# Patient Record
Sex: Female | Born: 1965 | Race: White | Hispanic: No | Marital: Single | Smoking: Current every day smoker
Health system: Southern US, Community
[De-identification: ages and names within clinical notes are randomized; demographics above are authoritative.]

## PROBLEM LIST (undated history)

## (undated) DIAGNOSIS — M1990 Unspecified osteoarthritis, unspecified site: Secondary | ICD-10-CM

## (undated) DIAGNOSIS — K219 Gastro-esophageal reflux disease without esophagitis: Secondary | ICD-10-CM

## (undated) DIAGNOSIS — E785 Hyperlipidemia, unspecified: Secondary | ICD-10-CM

## (undated) DIAGNOSIS — F17200 Nicotine dependence, unspecified, uncomplicated: Secondary | ICD-10-CM

## (undated) DIAGNOSIS — R7303 Prediabetes: Secondary | ICD-10-CM

## (undated) DIAGNOSIS — I10 Essential (primary) hypertension: Secondary | ICD-10-CM

## (undated) HISTORY — PX: NO PAST SURGERIES: SHX2092

## (undated) HISTORY — DX: Essential (primary) hypertension: I10

---

## 1998-03-11 ENCOUNTER — Emergency Department (HOSPITAL_COMMUNITY): Admission: EM | Admit: 1998-03-11 | Discharge: 1998-03-11 | Payer: Self-pay | Admitting: Emergency Medicine

## 1998-11-08 ENCOUNTER — Emergency Department (HOSPITAL_COMMUNITY): Admission: EM | Admit: 1998-11-08 | Discharge: 1998-11-08 | Payer: Self-pay | Admitting: Emergency Medicine

## 2000-10-30 ENCOUNTER — Emergency Department (HOSPITAL_COMMUNITY): Admission: EM | Admit: 2000-10-30 | Discharge: 2000-10-30 | Payer: Self-pay | Admitting: Emergency Medicine

## 2003-03-31 ENCOUNTER — Emergency Department (HOSPITAL_COMMUNITY): Admission: AD | Admit: 2003-03-31 | Discharge: 2003-03-31 | Payer: Self-pay | Admitting: Family Medicine

## 2003-06-06 ENCOUNTER — Emergency Department (HOSPITAL_COMMUNITY): Admission: AD | Admit: 2003-06-06 | Discharge: 2003-06-06 | Payer: Self-pay | Admitting: Internal Medicine

## 2003-07-16 ENCOUNTER — Emergency Department (HOSPITAL_COMMUNITY): Admission: EM | Admit: 2003-07-16 | Discharge: 2003-07-16 | Payer: Self-pay | Admitting: Emergency Medicine

## 2005-05-31 ENCOUNTER — Emergency Department (HOSPITAL_COMMUNITY): Admission: EM | Admit: 2005-05-31 | Discharge: 2005-05-31 | Payer: Self-pay | Admitting: Family Medicine

## 2006-10-01 ENCOUNTER — Emergency Department (HOSPITAL_COMMUNITY): Admission: EM | Admit: 2006-10-01 | Discharge: 2006-10-01 | Payer: Self-pay | Admitting: Family Medicine

## 2007-07-30 ENCOUNTER — Emergency Department (HOSPITAL_COMMUNITY): Admission: EM | Admit: 2007-07-30 | Discharge: 2007-07-30 | Payer: Self-pay | Admitting: Family Medicine

## 2008-10-13 ENCOUNTER — Emergency Department (HOSPITAL_COMMUNITY): Admission: EM | Admit: 2008-10-13 | Discharge: 2008-10-13 | Payer: Self-pay | Admitting: Emergency Medicine

## 2009-10-04 ENCOUNTER — Emergency Department (HOSPITAL_COMMUNITY): Admission: EM | Admit: 2009-10-04 | Discharge: 2009-10-04 | Payer: Self-pay | Admitting: Emergency Medicine

## 2010-08-22 LAB — POCT RAPID STREP A (OFFICE): Streptococcus, Group A Screen (Direct): NEGATIVE

## 2011-01-23 ENCOUNTER — Ambulatory Visit (INDEPENDENT_AMBULATORY_CARE_PROVIDER_SITE_OTHER): Payer: Self-pay

## 2011-01-23 ENCOUNTER — Inpatient Hospital Stay (INDEPENDENT_AMBULATORY_CARE_PROVIDER_SITE_OTHER)
Admission: RE | Admit: 2011-01-23 | Discharge: 2011-01-23 | Disposition: A | Discharge status: Home / Self Care | Payer: Self-pay | Source: Ambulatory Visit | Attending: Family Medicine | Admitting: Family Medicine

## 2011-01-23 DIAGNOSIS — S93409A Sprain of unspecified ligament of unspecified ankle, initial encounter: Secondary | ICD-10-CM

## 2011-03-27 ENCOUNTER — Encounter: Payer: Self-pay | Admitting: *Deleted

## 2011-03-27 ENCOUNTER — Emergency Department (INDEPENDENT_AMBULATORY_CARE_PROVIDER_SITE_OTHER)
Admission: EM | Admit: 2011-03-27 | Discharge: 2011-03-27 | Disposition: A | Discharge status: Home / Self Care | Payer: Self-pay | Source: Home / Self Care | Attending: Emergency Medicine | Admitting: Emergency Medicine

## 2011-03-27 DIAGNOSIS — R591 Generalized enlarged lymph nodes: Secondary | ICD-10-CM

## 2011-03-27 DIAGNOSIS — R599 Enlarged lymph nodes, unspecified: Secondary | ICD-10-CM

## 2011-03-27 MED ORDER — IBUPROFEN 800 MG PO TABS: 800.0000 mg | ORAL_TABLET | Freq: Three times a day (TID) | ORAL | Status: AC

## 2011-03-27 NOTE — ED Provider Notes (Signed)
History     CSN: [id number] Arrival date & time: 03/27/2011  1:09 PM   First MD Initiated Contact with Patient 03/27/11 1231      Chief Complaint  Patient presents with  . Headache    (Consider location/radiation/quality/duration/timing/severity/associated sxs/prior treatment) HPI Comments: Pt states she noticed a tender lump behind her Lt ear 2 days ago. No change in size since onset. Denies fever, ear pain, or sore throat. Has mild ongoing nasal congestion - unchanged. No scalp lesions and has not recently used any hair processing products. She tried Tylenol for her discomfort w/o relief. Mild constant discomfort, but mostly is tender to palpation.   The history is provided by the patient.    History reviewed. No pertinent past medical history.  History reviewed. No pertinent past surgical history.  Family History  Problem Relation Age of Onset  . Hypertension Mother   . Diabetes Mother     History  Substance Use Topics  . Smoking status: Current Everyday Smoker -- 0.5 packs/day    Types: Cigarettes  . Smokeless tobacco: Not on file  . Alcohol Use: Yes     socially    OB History    Grav Para Term Preterm Abortions TAB SAB Ect Mult Living                  Review of Systems  Constitutional: Negative for fever, chills and fatigue.  HENT: Negative for ear pain, sore throat, rhinorrhea, sneezing, postnasal drip and sinus pressure.   Respiratory: Negative for cough, shortness of breath and wheezing.   Cardiovascular: Negative for chest pain and palpitations.    Allergies  Review of patient's allergies indicates no known allergies.  Home Medications   Current Outpatient Rx  Name Route Sig Dispense Refill  . IBUPROFEN 800 MG PO TABS Oral Take 1 tablet (800 mg total) by mouth 3 (three) times daily. 15 tablet 0    BP 153/90  Pulse 78  Temp(Src) 98.9 F (37.2 C) (Oral)  Resp 18  SpO2 100%  LMP 03/26/2011  Physical Exam  Nursing note and vitals  reviewed. Constitutional: She appears well-developed and well-nourished. No distress.  HENT:  Head: Normocephalic and atraumatic.  Right Ear: Tympanic membrane, external ear and ear canal normal.  Left Ear: Tympanic membrane, external ear and ear canal normal.  Nose: Nose normal.  Mouth/Throat: Uvula is midline, oropharynx is clear and moist and mucous membranes are normal. No oropharyngeal exudate, posterior oropharyngeal edema or posterior oropharyngeal erythema.  Neck: Neck supple.  Cardiovascular: Normal rate, regular rhythm and normal heart sounds.   Pulmonary/Chest: Effort normal and breath sounds normal. No respiratory distress.  Lymphadenopathy:       Head (right side): No submental, no submandibular, no tonsillar, no preauricular, no posterior auricular and no occipital adenopathy present.       Head (left side): Occipital (one tender smooth node < 1cm ) adenopathy present. No submental, no submandibular, no tonsillar, no preauricular and no posterior auricular adenopathy present.    She has no cervical adenopathy.    She has no axillary adenopathy.       Right: No supraclavicular adenopathy present.       Left: No supraclavicular adenopathy present.  Neurological: She is alert.  Skin: Skin is warm and dry. No rash noted.  Psychiatric: She has a normal mood and affect.    ED Course  Procedures (including critical care time)  Labs Reviewed - No data to display No results found.  1. Lymphadenopathy       MDM  One shotty Lt occipital node w/ no other lymphadenopathy.        Melody Comas, [place] 03/27/11 1451

## 2011-03-27 NOTE — ED Provider Notes (Signed)
Medical screening examination/treatment/procedure(s) were performed by non-physician practitioner and as supervising physician I was immediately available for consultation/collaboration.  Kiah Keay G  D.O.    Marthena Whitmyer G Meda Dudzinski, MD 03/27/11 1605 

## 2011-03-27 NOTE — ED Notes (Signed)
Pt c/o pain behind left ear with swelling onset Saturday.  Denies injury, earache, sinus congestion.  States it's sore to touch.  Denies fever.

## 2012-07-01 ENCOUNTER — Encounter (HOSPITAL_COMMUNITY): Payer: Self-pay | Admitting: *Deleted

## 2012-07-01 ENCOUNTER — Emergency Department (HOSPITAL_COMMUNITY)
Admission: EM | Admit: 2012-07-01 | Discharge: 2012-07-01 | Disposition: A | Discharge status: Home / Self Care | Payer: Self-pay | Attending: Emergency Medicine | Admitting: Emergency Medicine

## 2012-07-01 ENCOUNTER — Emergency Department (HOSPITAL_COMMUNITY): Payer: Self-pay

## 2012-07-01 DIAGNOSIS — M778 Other enthesopathies, not elsewhere classified: Secondary | ICD-10-CM

## 2012-07-01 DIAGNOSIS — F17200 Nicotine dependence, unspecified, uncomplicated: Secondary | ICD-10-CM | POA: Insufficient documentation

## 2012-07-01 DIAGNOSIS — M6580 Other synovitis and tenosynovitis, unspecified site: Secondary | ICD-10-CM | POA: Insufficient documentation

## 2012-07-01 MED ORDER — PROMETHAZINE HCL 12.5 MG PO TABS: 12.5000 mg | ORAL_TABLET | Freq: Four times a day (QID) | ORAL | Status: DC | PRN

## 2012-07-01 MED ORDER — NAPROXEN 500 MG PO TABS: 500.0000 mg | ORAL_TABLET | Freq: Two times a day (BID) | ORAL | Status: DC

## 2012-07-01 MED ORDER — TRAMADOL HCL 50 MG PO TABS: 50.0000 mg | ORAL_TABLET | Freq: Four times a day (QID) | ORAL | Status: DC | PRN

## 2012-07-01 NOTE — ED Provider Notes (Signed)
History     CSN: [id number]  Arrival date & time 07/01/12  7829   First MD Initiated Contact with Patient 07/01/12 1119      Chief Complaint  Patient presents with  . Elbow Pain    HPI Kristi Cisneros is a 46 y.o. female who presents to the ED with elbow pain. The pain started 4 or 5 months ago. No known injury. The history was provided by the patient.  History reviewed. No pertinent past medical history.  History reviewed. No pertinent past surgical history.  Family History  Problem Relation Age of Onset  . Hypertension Mother   . Diabetes Mother     History  Substance Use Topics  . Smoking status: Current Every Day Smoker -- 0.50 packs/day    Types: Cigarettes  . Smokeless tobacco: Not on file  . Alcohol Use: Yes     Comment: socially    OB History   Grav Para Term Preterm Abortions TAB SAB Ect Mult Living                  Review of Systems  Constitutional: Negative for fever and appetite change.  HENT: Negative.   Eyes: Negative for visual disturbance.  Respiratory: Negative for cough.   Musculoskeletal: Positive for joint swelling.       Pain in left elbow  Allergic/Immunologic: Negative for food allergies and immunocompromised state.  Psychiatric/Behavioral: Negative for confusion. The patient is not nervous/anxious.     Allergies  Review of patient's allergies indicates no known allergies.  Home Medications  No current outpatient prescriptions on file.  BP 149/112  Pulse 108  Temp(Src) 98 F (36.7 C) (Oral)  Resp 16  Ht 5\' 7"  (1.702 m)  Wt 180 lb (81.647 kg)  BMI 28.19 kg/m2  SpO2 100%  LMP 06/29/2012  Physical Exam  Nursing note and vitals reviewed. Constitutional: She is oriented to person, place, and time. She appears well-developed and well-nourished. No distress.  HENT:  Head: Normocephalic and atraumatic.  Eyes: EOM are normal.  Neck: Neck supple.  Cardiovascular: Normal rate.   Pulmonary/Chest: Effort normal.  Musculoskeletal:        Left elbow: She exhibits normal range of motion, no deformity and no laceration. Swelling: minimal. Tenderness found. Radial head tenderness noted.  Radial pulse strong and equal bilateral. Good grips, adequate circulation.  Neurological: She is alert and oriented to person, place, and time. No cranial nerve deficit.  Skin: Skin is warm and dry.  Psychiatric: She has a normal mood and affect. Her behavior is normal. Judgment and thought content normal.   Procedures  Labs Reviewed - No data to display Dg Elbow Complete Left  07/01/2012  *RADIOLOGY REPORT*  Clinical Data: Left elbow pain.  LEFT ELBOW - COMPLETE 3+ VIEW  Comparison: 06/06/2003.  Findings: Four views of the left elbow demonstrate no acute displaced fracture, subluxation, dislocation, joint or soft tissue abnormality.  IMPRESSION: 1.  No acute radiographic abnormality of the left elbow.   Original Report Authenticated By: Trudie Reed, M.D.    Assessment: 47 y.o. female with tendonitis left elbow  Plan:  Pain management   Follow up with ortho, return as needed Discussed with the patient and all questioned fully answered. She will return if any problems arise.    Medication List    TAKE these medications       naproxen 500 MG tablet  Commonly known as:  NAPROSYN  Take 1 tablet (500 mg total) by mouth 2 (  two) times daily.     promethazine 12.5 MG tablet  Commonly known as:  PHENERGAN  Take 1 tablet (12.5 mg total) by mouth every 6 (six) hours as needed for nausea.     traMADol 50 MG tablet  Commonly known as:  ULTRAM  Take 1 tablet (50 mg total) by mouth every 6 (six) hours as needed for pain.             Janne Napoleon, [place] 07/01/12 1152

## 2012-07-01 NOTE — ED Notes (Signed)
Left elbow pain with swelling x 4-5 months.  Denies injury.

## 2012-07-01 NOTE — ED Provider Notes (Signed)
Medical screening examination/treatment/procedure(s) were performed by non-physician practitioner and as supervising physician I was immediately available for consultation/collaboration.   Benny Lennert, MD 07/01/12 1534

## 2012-08-14 ENCOUNTER — Emergency Department (INDEPENDENT_AMBULATORY_CARE_PROVIDER_SITE_OTHER)
Admission: EM | Admit: 2012-08-14 | Discharge: 2012-08-14 | Disposition: A | Discharge status: Home / Self Care | Payer: Self-pay | Source: Home / Self Care | Attending: Emergency Medicine | Admitting: Emergency Medicine

## 2012-08-14 ENCOUNTER — Encounter (HOSPITAL_COMMUNITY): Payer: Self-pay | Admitting: *Deleted

## 2012-08-14 ENCOUNTER — Emergency Department (INDEPENDENT_AMBULATORY_CARE_PROVIDER_SITE_OTHER): Payer: Self-pay

## 2012-08-14 DIAGNOSIS — S93609A Unspecified sprain of unspecified foot, initial encounter: Secondary | ICD-10-CM

## 2012-08-14 DIAGNOSIS — S93409A Sprain of unspecified ligament of unspecified ankle, initial encounter: Secondary | ICD-10-CM

## 2012-08-14 DIAGNOSIS — S93401A Sprain of unspecified ligament of right ankle, initial encounter: Secondary | ICD-10-CM

## 2012-08-14 DIAGNOSIS — S93601A Unspecified sprain of right foot, initial encounter: Secondary | ICD-10-CM

## 2012-08-14 MED ORDER — HYDROCODONE-IBUPROFEN 7.5-200 MG PO TABS: 1.0000 | ORAL_TABLET | Freq: Three times a day (TID) | ORAL | Status: DC | PRN

## 2012-08-14 NOTE — ED Provider Notes (Addendum)
History     CSN: [id number]  Arrival date & time 08/14/12  1127   First MD Initiated Contact with Patient 08/14/12 1158      Chief Complaint  Patient presents with  . Ankle Pain    (Consider location/radiation/quality/duration/timing/severity/associated sxs/prior treatment) HPI Comments: Pt  States  She  Injured  Her  ra  nkle  yest  She  Reports  She  Stepped in a  Hole   And  Twisted  The  Ankle       Patient is a 47 y.o. female presenting with ankle pain. The history is provided by the patient.  Ankle Pain Location:  Ankle Ankle location:  R ankle Pain details:    Quality:  Aching   Radiates to:  Does not radiate   Severity:  Moderate   Onset quality:  Sudden   Timing:  Constant Chronicity:  New Dislocation: no   Foreign body present:  No foreign bodies Tetanus status:  Out of date Prior injury to area:  No Worsened by:  Bearing weight and activity Associated symptoms: swelling   Associated symptoms: no back pain, no decreased ROM, no fever, no itching, no muscle weakness, no neck pain, no stiffness and no tingling   Risk factors: no frequent fractures and no recent illness     History reviewed. No pertinent past medical history.  History reviewed. No pertinent past surgical history.  Family History  Problem Relation Age of Onset  . Hypertension Mother   . Diabetes Mother     History  Substance Use Topics  . Smoking status: Current Every Day Smoker -- 0.50 packs/day    Types: Cigarettes  . Smokeless tobacco: Not on file  . Alcohol Use: Yes     Comment: socially    OB History   Grav Para Term Preterm Abortions TAB SAB Ect Mult Living                  Review of Systems  Constitutional: Positive for activity change. Negative for fever, chills, diaphoresis and appetite change.  HENT: Negative for neck pain.   Musculoskeletal: Negative for myalgias, back pain, joint swelling, arthralgias and stiffness.  Skin: Negative for color change, itching, pallor,  rash and wound.  Neurological: Negative for weakness and numbness.    Allergies  Review of patient's allergies indicates no known allergies.  Home Medications   Current Outpatient Rx  Name  Route  Sig  Dispense  Refill  . HYDROcodone-ibuprofen (VICOPROFEN) 7.5-200 MG per tablet   Oral   Take 1 tablet by mouth every 8 (eight) hours as needed for pain.   15 tablet   0   . promethazine (PHENERGAN) 12.5 MG tablet   Oral   Take 1 tablet (12.5 mg total) by mouth every 6 (six) hours as needed for nausea.   15 tablet   0     BP 161/103  Pulse 106  Temp(Src) 98.2 F (36.8 C) (Oral)  Resp 16  SpO2 100%  LMP 08/02/2012  Physical Exam  Vitals reviewed. Constitutional: She appears well-developed and well-nourished.  Musculoskeletal: She exhibits tenderness.       Feet:  Neurological: She is alert.  Skin: No rash noted. No erythema.    ED Course  Procedures (including critical care time)  Labs Reviewed - No data to display Dg Ankle Complete Right  08/14/2012  *RADIOLOGY REPORT*  Clinical Data: Ankle pain post fall yesterday  RIGHT ANKLE - COMPLETE 3+ VIEW  Comparison: 01/23/2011  Findings: Three views of the right ankle submitted.  No acute fracture or subluxation.  Plantar spur of the calcaneus is noted. Ankle mortise is preserved.  Soft tissue swelling noted adjacent to lateral malleolus.  IMPRESSION: No acute fracture or subluxation.  Lateral soft tissue swelling. Plantar spur of the calcaneus.   Original Report Authenticated By: Natasha Mead, M.D.      1. Ankle sprain, right, initial encounter   2. Foot sprain, right, initial encounter       MDM  Right ankle inversion injury. Patient has been put on a ankle air splint. Use crutches for 72 hours. Vicoprophen Rx        Jimmie Molly, MD 08/14/12 1606  Jimmie Molly, MD 08/14/12 [phone]

## 2012-08-14 NOTE — ED Notes (Signed)
Pt  States  She  Injured  Her  ra  nkle  yest  She  Reports  She  Stepped in a  Hole   And  Twisted  The  Ankle       She  Reports  Pain on  Weight bearing      denys  Any other  injury

## 2013-12-26 ENCOUNTER — Encounter (HOSPITAL_COMMUNITY): Payer: Self-pay | Admitting: Emergency Medicine

## 2013-12-26 ENCOUNTER — Emergency Department (INDEPENDENT_AMBULATORY_CARE_PROVIDER_SITE_OTHER)
Admission: EM | Admit: 2013-12-26 | Discharge: 2013-12-26 | Disposition: A | Discharge status: Home / Self Care | Payer: Self-pay | Source: Home / Self Care | Attending: Emergency Medicine | Admitting: Emergency Medicine

## 2013-12-26 DIAGNOSIS — R030 Elevated blood-pressure reading, without diagnosis of hypertension: Secondary | ICD-10-CM

## 2013-12-26 DIAGNOSIS — IMO0001 Reserved for inherently not codable concepts without codable children: Secondary | ICD-10-CM

## 2013-12-26 LAB — POCT I-STAT, CHEM 8
BUN: 15 mg/dL (ref 6–23)
CALCIUM ION: 1.24 mmol/L — AB (ref 1.12–1.23)
Chloride: 102 mEq/L (ref 96–112)
Creatinine, Ser: 0.9 mg/dL (ref 0.50–1.10)
GLUCOSE: 100 mg/dL — AB (ref 70–99)
HEMATOCRIT: 46 % (ref 36.0–46.0)
HEMOGLOBIN: 15.6 g/dL — AB (ref 12.0–15.0)
POTASSIUM: 4 meq/L (ref 3.7–5.3)
Sodium: 139 mEq/L (ref 137–147)
TCO2: 24 mmol/L (ref 0–100)

## 2013-12-26 MED ORDER — HYDROCHLOROTHIAZIDE 25 MG PO TABS: 25.0000 mg | ORAL_TABLET | Freq: Every day | ORAL | Status: DC

## 2013-12-26 NOTE — ED Provider Notes (Signed)
CSN: [id number]     Arrival date & time 12/26/13  [id number] History   First MD Initiated Contact with Patient 12/26/13 [phone]     Chief Complaint  Patient presents with  . Hypertension   (Consider location/radiation/quality/duration/timing/severity/associated sxs/prior Treatment) HPI She is a 48 year old woman here for evaluation of high blood pressure. She states last week she was at [employer], and had a slight headache. She randomly decided to check her blood pressure and it was elevated at 207/100 something. Yesterday, her mother checked her blood pressure with her home machine several times and it remained 180s-190s/110s. She reports intermittent headaches that respond well to Tylenol and Motrin. She denies any changes in her vision, beyond needing some reading glasses. She does state that she will get a fluttering sensation in her chest. It is located in the right superior chest and back. It typically occurs when she is rushing around in the morning and occasionally when she is going up and down stairs at work. She also reports intermittently feeling like she needs to take a deep breath, but denies any shortness of breath. Denies any leg swelling. She does have chronic foot pain from what sounds like plantar fasciitis, but denies any claudication symptoms.  Hypertension does run in her family. She is a current smoker, half a pack to one pack a day.  History reviewed. No pertinent past medical history. History reviewed. No pertinent past surgical history. Family History  Problem Relation Age of Onset  . Hypertension Mother   . Diabetes Mother    History  Substance Use Topics  . Smoking status: Current Every Day Smoker -- 0.50 packs/day    Types: Cigarettes  . Smokeless tobacco: Not on file  . Alcohol Use: Yes     Comment: socially   OB History   Grav Para Term Preterm Abortions TAB SAB Ect Mult Living                 Review of Systems  Constitutional: Negative.   Eyes: Negative  for visual disturbance.  Respiratory: Negative for cough and shortness of breath.   Cardiovascular: Positive for chest pain. Negative for palpitations and leg swelling.  Gastrointestinal: Negative.   Neurological: Positive for headaches.    Allergies  Review of patient's allergies indicates no known allergies.  Home Medications   Prior to Admission medications   Medication Sig Start Date End Date Taking? Authorizing Provider  hydrochlorothiazide (HYDRODIURIL) 25 MG tablet Take 1 tablet (25 mg total) by mouth daily. 12/26/13   Charm RingsErin J Honig, MD  HYDROcodone-ibuprofen (VICOPROFEN) 7.5-200 MG per tablet Take 1 tablet by mouth every 8 (eight) hours as needed for pain. 08/14/12   Jimmie MollyPaolo Coll, MD  promethazine (PHENERGAN) 12.5 MG tablet Take 1 tablet (12.5 mg total) by mouth every 6 (six) hours as needed for nausea. 07/01/12   Hope Orlene OchM Neese, NP   BP 183/130  Pulse 97  Temp(Src) 98.5 F (36.9 C) (Oral)  Resp 18  SpO2 100%  LMP 12/15/2013 Physical Exam  Constitutional: She is oriented to person, place, and time. She appears well-developed and well-nourished. She appears distressed (mildly anxious).  HENT:  Head: Normocephalic and atraumatic.  Eyes: Conjunctivae are normal. Right eye exhibits no discharge. Left eye exhibits no discharge.  Neck: Normal range of motion. Neck supple.  Cardiovascular: Normal rate, regular rhythm and normal heart sounds.  Exam reveals no gallop.   No murmur heard. Faint bilateral DP pulses, weaker on the left.  Cap refill of 2-3  seconds on the left.  Pulmonary/Chest: Effort normal and breath sounds normal. No respiratory distress. She has no wheezes. She has no rales.  Musculoskeletal: She exhibits no edema.  Lymphadenopathy:    She has no cervical adenopathy.  Neurological: She is alert and oriented to person, place, and time.  Skin: Skin is warm and dry. No rash noted.    ED Course  EKG  Date/Time: 12/26/2013 9:13 AM Performed by: Charm Rings Authorized  by: Charm Rings Interpreted by ED physician Previous ECG: no previous ECG available Rhythm: sinus rhythm Rate: normal BPM: 76 QRS axis: normal ST Segments: ST segments normal T depression: III Clinical impression: non-specific ECG   (including critical care time) Labs Review Labs Reviewed  POCT I-STAT, CHEM 8 - Abnormal; Notable for the following:    Glucose, Bld 100 (*)    Calcium, Ion 1.24 (*)    Hemoglobin 15.6 (*)    All other components within normal limits    Imaging Review No results found.   MDM   1. Elevated BP    With report of chest discomfort that has an exertional component, although it is otherwise atypical, will check an EKG. Will also check an i-STAT for kidney function and electrolytes as I suspect she will need a medication. I am also concerned about peripheral vascular disease given faint pulses. She does not currently have symptoms of claudication. Our financial counselor spoke with her during this visit.  Hypertension may be secondary to stress given multiple life changes in the last few months. However, on review of her records her blood pressure has been elevated on multiple occasions. EKG an i-STAT reviewed. Will start HCTZ 25 mg daily. She will work on finding a primary care physician. If she is unable to find one in the next 2-3 weeks, she will followup here for a repeat i-STAT and blood pressure check. Reviewed warning signs with the patient.    Charm Rings, MD 12/26/13 1011

## 2013-12-26 NOTE — Discharge Instructions (Signed)
Your blood pressure is elevated.  This may be due to the current stress you have. I started a medicine call HCTZ.  Take 1 pill daily.  It may make you pee more the first week or so. Monitor your blood pressure at home; I would like for it to gradually come down to <160/100. If you are getting dizzy, especially when you first stand up, check and blood pressure and make sure we aren't dropping it too low.  Work on getting a primary care doctor. If you are unable to establish with anyone in the next 2-3 weeks, please follow up here so we can recheck your electrolytes and your blood pressure.

## 2013-12-26 NOTE — ED Notes (Signed)
C/o HTN States she had a headache one day and while at the store she checked her bp at the machine States pressure was high so a couple of days later she had her mom check it with her machine  Does have family hx of bp No pcp No meds taking

## 2014-03-04 ENCOUNTER — Ambulatory Visit: Payer: Self-pay | Attending: Family Medicine | Admitting: Family Medicine

## 2014-03-04 ENCOUNTER — Encounter: Payer: Self-pay | Admitting: Family Medicine

## 2014-03-04 VITALS — BP 158/99 | HR 94 | Temp 98.6°F | Resp 16 | Ht 67.0 in | Wt 234.0 lb

## 2014-03-04 DIAGNOSIS — Z8249 Family history of ischemic heart disease and other diseases of the circulatory system: Secondary | ICD-10-CM | POA: Insufficient documentation

## 2014-03-04 DIAGNOSIS — F17200 Nicotine dependence, unspecified, uncomplicated: Secondary | ICD-10-CM

## 2014-03-04 DIAGNOSIS — I10 Essential (primary) hypertension: Secondary | ICD-10-CM | POA: Insufficient documentation

## 2014-03-04 DIAGNOSIS — Z720 Tobacco use: Secondary | ICD-10-CM | POA: Insufficient documentation

## 2014-03-04 DIAGNOSIS — E785 Hyperlipidemia, unspecified: Secondary | ICD-10-CM | POA: Insufficient documentation

## 2014-03-04 MED ORDER — HYDROCHLOROTHIAZIDE 25 MG PO TABS: 25.0000 mg | ORAL_TABLET | Freq: Every day | ORAL | Status: DC

## 2014-03-04 NOTE — Assessment & Plan Note (Signed)
A: HTN currently untreated P: Restart HCTZ Check TSH and lipids

## 2014-03-04 NOTE — Progress Notes (Signed)
Establish Care Hx HTN, no taking medication x1wk

## 2014-03-04 NOTE — Assessment & Plan Note (Signed)
A: x 28 years. Desires to quit P: Cessation resources  Patient to read up on chantix and wellbutrin

## 2014-03-04 NOTE — Progress Notes (Signed)
   Subjective:    Patient ID: Kristi Cisneros, female    DOB: 1965-07-23, 48 y.o.   MRN: [id number] CC: establish care, HTN  HPI   1. HTN: dx in 2015. Patient's long term partner also with HTN. She does not eat a low salt diet. She is active at work but does not exercise. She denies vision changes, CP, SOB, syncope. She was on HCTZ from the urgent care and tolerated this well.   2. Smoker: since age 48. 1 PPD. Working to quit now. Down to 1/5 PPD. Denies chronic cough.   Med Hx: negative for diabetes  Soc hx: current smoker   Fam hx: mother with HTN   Review of Systems As per HPI     Objective:   Physical Exam BP 158/99  Pulse 94  Temp(Src) 98.6 F (37 C) (Oral)  Resp 16  Ht 5\' 7"  (1.702 m)  Wt 234 lb (106.142 kg)  BMI 36.64 kg/m2  SpO2 99%  LMP 02/12/2014 BP Readings from Last 3 Encounters:  03/04/14 158/99  12/26/13 183/130  08/14/12 161/103  General appearance: alert, cooperative and no distress Neck: no adenopathy and thyroid not enlarged, symmetric, no tenderness/mass/nodules Lungs: clear to auscultation bilaterally Heart: regular rate and rhythm, S1, S2 normal, no murmur, click, rub or gallop Extremities: extremities normal, atraumatic, no cyanosis or edema    Assessment & Plan:

## 2014-03-04 NOTE — Patient Instructions (Addendum)
Kristi Cisneros,  Thank you for coming in today. It was a pleasure meeting you. I look forward to being your primary doctor.  1. HTN:  Restart HCTZ 25 mg daily Low salt diet See patient goals Start with 3 meals and 2 snacks daily. Be sure to eat vegetables with every lunch and dinner.   You will be called with lab results   F/u in 2-3 weeks for RN BP check  F/u in 4-6 weeks for physical with pap  Dr. Armen PickupFunches   Low-Sodium Eating Plan Sodium raises blood pressure and causes water to be held in the body. Getting less sodium from food will help lower your blood pressure, reduce any swelling, and protect your heart, liver, and kidneys. We get sodium by adding salt (sodium chloride) to food. Most of our sodium comes from canned, boxed, and frozen foods. Restaurant foods, fast foods, and pizza are also very high in sodium. Even if you take medicine to lower your blood pressure or to reduce fluid in your body, getting less sodium from your food is important. WHAT IS MY PLAN? Most people should limit their sodium intake to 2,300 mg a day. Your health care provider recommends that you limit your sodium intake to __________ a day.  WHAT DO I NEED TO KNOW ABOUT THIS EATING PLAN? For the low-sodium eating plan, you will follow these general guidelines:  Choose foods with a % Daily Value for sodium of less than 5% (as listed on the food label).   Use salt-free seasonings or herbs instead of table salt or sea salt.   Check with your health care provider or pharmacist before using salt substitutes.   Eat fresh foods.  Eat more vegetables and fruits.  Limit canned vegetables. If you do use them, rinse them well to decrease the sodium.   Limit cheese to 1 oz (28 g) per day.   Eat lower-sodium products, often labeled as "lower sodium" or "no salt added."  Avoid foods that contain monosodium glutamate (MSG). MSG is sometimes added to [place] food and some canned foods.  Check food labels  (Nutrition Facts labels) on foods to learn how much sodium is in one serving.  Eat more home-cooked food and less restaurant, buffet, and fast food.  When eating at a restaurant, ask that your food be prepared with less salt or none, if possible.  HOW DO I READ FOOD LABELS FOR SODIUM INFORMATION? The Nutrition Facts label lists the amount of sodium in one serving of the food. If you eat more than one serving, you must multiply the listed amount of sodium by the number of servings. Food labels may also identify foods as:  Sodium free--Less than 5 mg in a serving.  Very low sodium--35 mg or less in a serving.  Low sodium--140 mg or less in a serving.  Light in sodium--50% less sodium in a serving. For example, if a food that usually has 300 mg of sodium is changed to become light in sodium, it will have 150 mg of sodium.  Reduced sodium--25% less sodium in a serving. For example, if a food that usually has 400 mg of sodium is changed to reduced sodium, it will have 300 mg of sodium. WHAT FOODS CAN I EAT? Grains Low-sodium cereals, including oats, puffed wheat and rice, and shredded wheat cereals. Low-sodium crackers. Unsalted rice and pasta. Lower-sodium bread.  Vegetables Frozen or fresh vegetables. Low-sodium or reduced-sodium canned vegetables. Low-sodium or reduced-sodium tomato sauce and paste. Low-sodium or reduced-sodium  tomato and vegetable juices.  Fruits Fresh, frozen, and canned fruit. Fruit juice.  Meat and Other Protein Products Low-sodium canned tuna and salmon. Fresh or frozen meat, poultry, seafood, and fish. Lamb. Unsalted nuts. Dried beans, peas, and lentils without added salt. Unsalted canned beans. Homemade soups without salt. Eggs.  Dairy Milk. Soy milk. Ricotta cheese. Low-sodium or reduced-sodium cheeses. Yogurt.  Condiments Fresh and dried herbs and spices. Salt-free seasonings. Onion and garlic powders. Low-sodium varieties of mustard and ketchup. Lemon  juice.  Fats and Oils Reduced-sodium salad dressings. Unsalted butter.  Other Unsalted popcorn and pretzels.  The items listed above may not be a complete list of recommended foods or beverages. Contact your dietitian for more options. WHAT FOODS ARE NOT RECOMMENDED? Grains Instant hot cereals. Bread stuffing, pancake, and biscuit mixes. Croutons. Seasoned rice or pasta mixes. Noodle soup cups. Boxed or frozen macaroni and cheese. Self-rising flour. Regular salted crackers. Vegetables Regular canned vegetables. Regular canned tomato sauce and paste. Regular tomato and vegetable juices. Frozen vegetables in sauces. Salted french fries. Olives. Rosita FirePickles. Relishes. Sauerkraut. Salsa. Meat and Other Protein Products Salted, canned, smoked, spiced, or pickled meats, seafood, or fish. Bacon, ham, sausage, hot dogs, corned beef, chipped beef, and packaged luncheon meats. Salt pork. Jerky. Pickled herring. Anchovies, regular canned tuna, and sardines. Salted nuts. Dairy Processed cheese and cheese spreads. Cheese curds. Blue cheese and cottage cheese. Buttermilk.  Condiments Onion and garlic salt, seasoned salt, table salt, and sea salt. Canned and packaged gravies. Worcestershire sauce. Tartar sauce. Barbecue sauce. Teriyaki sauce. Soy sauce, including reduced sodium. Steak sauce. Fish sauce. Oyster sauce. Cocktail sauce. Horseradish. Regular ketchup and mustard. Meat flavorings and tenderizers. Bouillon cubes. Hot sauce. Tabasco sauce. Marinades. Taco seasonings. Relishes. Fats and Oils Regular salad dressings. Salted butter. Margarine. Ghee. Bacon fat.  Other Potato and tortilla chips. Corn chips and puffs. Salted popcorn and pretzels. Canned or dried soups. Pizza. Frozen entrees and pot pies.  The items listed above may not be a complete list of foods and beverages to avoid. Contact your dietitian for more information. Document Released: 10/21/2001 Document Revised: 05/06/2013 Document  Reviewed: 03/05/2013 [facility] Patient Information 2015 [employer], [place]. This information is not intended to replace advice given to you by your health care provider. Make sure you discuss any questions you have with your health care provider.

## 2014-03-05 LAB — TSH: TSH: 1.831 u[IU]/mL (ref 0.350–4.500)

## 2014-03-05 LAB — LIPID PANEL
Cholesterol: 254 mg/dL — ABNORMAL HIGH (ref 0–200)
HDL: 45 mg/dL (ref >39)
LDL Cholesterol: 177 mg/dL — ABNORMAL HIGH (ref 0–99)
Total CHOL/HDL Ratio: 5.6 Ratio
Triglycerides: 161 mg/dL — ABNORMAL HIGH (ref <150)
VLDL: 32 mg/dL (ref 0–40)

## 2014-03-06 DIAGNOSIS — E785 Hyperlipidemia, unspecified: Secondary | ICD-10-CM | POA: Insufficient documentation

## 2014-03-06 MED ORDER — ATORVASTATIN CALCIUM 40 MG PO TABS: 40.0000 mg | ORAL_TABLET | Freq: Every day | ORAL | Status: DC

## 2014-03-06 NOTE — Assessment & Plan Note (Signed)
High cholesterol with elevated risk of heart disease, please start lipitor 40 daily.

## 2014-03-06 NOTE — Addendum Note (Signed)
Addended by: Dessa PhiFUNCHES, Gerrick Ray on: 03/06/2014 01:54 PM   Modules accepted: Orders

## 2014-03-09 ENCOUNTER — Telehealth: Payer: Self-pay | Admitting: Emergency Medicine

## 2014-03-09 ENCOUNTER — Telehealth: Payer: Self-pay | Admitting: *Deleted

## 2014-03-09 NOTE — Telephone Encounter (Signed)
Pt given lab results with instructions to start taking prescribed Lipitor 40 mg tab daily Pt instructed to start low fat diet/exercise

## 2014-03-09 NOTE — Telephone Encounter (Signed)
Message copied by Saoirse Legere M on Mon Mar 09, 2014  2:47 PM ------      Message from: FUNCHES, JOSALYN      Created: Fri Mar 06, 2014  1:53 PM       High cholesterol with elevated risk of heart disease, please start lipitor 40 daily.        Normal TSH ------ 

## 2014-03-09 NOTE — Telephone Encounter (Deleted)
Message copied by Dyann KiefGIRALDEZ, Drema Eddington M on Mon Mar 09, 2014  2:47 PM ------      Message from: Dessa PhiFUNCHES, JOSALYN      Created: Fri Mar 06, 2014  1:53 PM       High cholesterol with elevated risk of heart disease, please start lipitor 40 daily.        Normal TSH ------

## 2014-03-09 NOTE — Telephone Encounter (Signed)
Left message with female to return call 

## 2014-03-25 ENCOUNTER — Ambulatory Visit: Payer: Self-pay | Attending: Family Medicine

## 2014-03-25 NOTE — Progress Notes (Unsigned)
   Subjective:    Patient ID: Kristi Cisneros, female    DOB: July 12, 1965, 48 y.o.   MRN: [id number]  HPI    Review of Systems     Objective:   Physical Exam        Assessment & Plan:

## 2014-03-25 NOTE — Patient Instructions (Signed)
Today your blood pressure is in normal range. Continue taking your medications as prescribed.

## 2014-03-25 NOTE — Progress Notes (Unsigned)
Pt is here for a BP check b/c she had elevated BP readings and was put on BP medications.

## 2014-03-27 ENCOUNTER — Ambulatory Visit: Payer: Self-pay | Attending: Family Medicine | Admitting: Family Medicine

## 2014-03-27 ENCOUNTER — Encounter: Payer: Self-pay | Admitting: Family Medicine

## 2014-03-27 VITALS — BP 118/81 | HR 86 | Temp 98.1°F | Resp 16 | Ht 67.0 in | Wt 232.0 lb

## 2014-03-27 DIAGNOSIS — M7661 Achilles tendinitis, right leg: Secondary | ICD-10-CM | POA: Insufficient documentation

## 2014-03-27 DIAGNOSIS — M7660 Achilles tendinitis, unspecified leg: Secondary | ICD-10-CM | POA: Insufficient documentation

## 2014-03-27 DIAGNOSIS — M722 Plantar fascial fibromatosis: Secondary | ICD-10-CM | POA: Insufficient documentation

## 2014-03-27 DIAGNOSIS — F17200 Nicotine dependence, unspecified, uncomplicated: Secondary | ICD-10-CM | POA: Insufficient documentation

## 2014-03-27 MED ORDER — DICLOFENAC SODIUM 1 % TD GEL: 1.0000 "application " | Freq: Four times a day (QID) | TRANSDERMAL | Status: DC

## 2014-03-27 MED ORDER — MELOXICAM 15 MG PO TABS: 15.0000 mg | ORAL_TABLET | Freq: Every day | ORAL | Status: DC

## 2014-03-27 NOTE — Progress Notes (Signed)
   Subjective:    Patient ID: Kristi Cisneros, female    DOB: 1965/12/31, 48 y.o.   MRN: [id number] CC: R ankle pain, b/l foot pain  HPI 48 yo F presents for f/u visit:  1. R ankle pain: x one year. Following twisting injury at home, patient stepped through a board on her deck. Lateral ankle swelling intermittently. Lateral and posterior ankle pain. No recent injury. Pain is worse at end of work day. Patient taking tylenol for pain w/o relief she sopped taking NSAIDs when she learned of high BP. Ice and heat worsen pain.   2. B/l foot pain: dorsal foot pain b/l. Worse when elevated and at end of day. Pain is near heels. Patient does not wear high heels. 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 Heat improves pain.   Soc hx: current smoker  Review of Systems As per HPI     Objective:   Physical Exam BP 118/81 mmHg  Pulse 86  Temp(Src) 98.1 F (36.7 C) (Oral)  Resp 16  Ht 5\' 7"  (1.702 m)  Wt 232 lb (105.235 kg)  BMI 36.33 kg/m2  SpO2 100%  LMP 03/11/2014  Wt Readings from Last 3 Encounters:  03/27/14 232 lb (105.235 kg)  03/04/14 234 lb (106.142 kg)  07/01/12 180 lb (81.647 kg)   General appearance: alert, cooperative and no distress Ankles/feet: R: lateral ankle swelling, no erythema, or skin lacerations. Decreased ROM especially plantar flexion and eversion. Tenderness lateral malleolus, distal Achilles tendon, cuboid, proximal plantar fascia.   L: no ankle swelling. Normal ROM. Normal skin. Tenderness proximal plantar fascia only.        Assessment & Plan:

## 2014-03-27 NOTE — Assessment & Plan Note (Signed)
A:  Left foot pain: plantar fascitis.  Plan: Mobic once daily with food- oral antiinflammatory Topical antiinflammatory- diclofenac gel Elevate Compression with Rt ankle- ACE (or ACE -like bandage) wrapping every morning Exercise as illustrated below, twice daily.  Referral to sports medicine for ultrasound and to discuss other treatment options like nitroglycerin, injections etc.   F/u in 4-6 weeks

## 2014-03-27 NOTE — Progress Notes (Signed)
Complaining of pain on feet, pain is worst on heels Rt ankle swelling and pain due to an accident one year ago

## 2014-03-27 NOTE — Patient Instructions (Addendum)
[name],  Thank you for coming back in today.  1. Rightt ankle pain: I suspect achilles tendon injury and plantar fascitis.  2. Left foot pain: plantar fascitis.  Plan: Mobic once daily with food- oral antiinflammatory Topical antiinflammatory- diclofenac gel Elevate Compression with Rt ankle- ACE (or ACE -like bandage) wrapping every morning Exercise as illustrated below, twice daily.  Referral to sports medicine for ultrasound and to discuss other treatment options like nitroglycerin, injections etc.   F/u in 4-6 weeks  Dr. Armen PickupFunches   Achilles Tendinitis  with Rehab Achilles tendinitis is a disorder of the Achilles tendon. The Achilles tendon connects the large calf muscles (Gastrocnemius and Soleus) to the heel bone (calcaneus). This tendon is sometimes called the heel cord. It is important for pushing-off and standing on your toes and is important for walking, running, or jumping. Tendinitis is often caused by overuse and repetitive microtrauma. SYMPTOMS  Pain, tenderness, swelling, warmth, and redness may occur over the Achilles tendon even at rest.  Pain with pushing off, or flexing or extending the ankle.  Pain that is worsened after or during activity. CAUSES   Overuse sometimes seen with rapid increase in exercise programs or in sports requiring running and jumping.  Poor physical conditioning (strength and flexibility or endurance).  Running sports, especially training running down hills.  Inadequate warm-up before practice or play or failure to stretch before participation.  Injury to the tendon. PREVENTION   Warm up and stretch before practice or competition.  Allow time for adequate rest and recovery between practices and competition.  Keep up conditioning.  Keep up ankle and leg flexibility.  Improve or keep muscle strength and endurance.  Improve cardiovascular fitness.  Use proper technique.  Use proper equipment (shoes, skates).  To help  prevent recurrence, taping, protective strapping, or an adhesive bandage may be recommended for several weeks after healing is complete. PROGNOSIS   Recovery may take weeks to several months to heal.  Longer recovery is expected if symptoms have been prolonged.  Recovery is usually quicker if the inflammation is due to a direct blow as compared with overuse or sudden strain. RELATED COMPLICATIONS   Healing time will be prolonged if the condition is not correctly treated. The injury must be given plenty of time to heal.  Symptoms can reoccur if activity is resumed too soon.  Untreated, tendinitis may increase the risk of tendon rupture requiring additional time for recovery and possibly surgery. TREATMENT   The first treatment consists of rest anti-inflammatory medication, and ice to relieve the pain.  Stretching and strengthening exercises after resolution of pain will likely help reduce the risk of recurrence. Referral to a physical therapist or athletic trainer for further evaluation and treatment may be helpful.  A walking boot or cast may be recommended to rest the Achilles tendon. This can help break the cycle of inflammation and microtrauma.  Arch supports (orthotics) may be prescribed or recommended by your caregiver as an adjunct to therapy and rest.  Surgery to remove the inflamed tendon lining or degenerated tendon tissue is rarely necessary and has shown less than predictable results. MEDICATION   Nonsteroidal anti-inflammatory medications, such as aspirin and ibuprofen, may be used for pain and inflammation relief. Do not take within 7 days before surgery. Take these as directed by your caregiver. Contact your caregiver immediately if any bleeding, stomach upset, or signs of allergic reaction occur. Other minor pain relievers, such as acetaminophen, may also be used.  Pain relievers  may be prescribed as necessary by your caregiver. Do not take prescription pain medication  for longer than 4 to 7 days. Use only as directed and only as much as you need.  Cortisone injections are rarely indicated. Cortisone injections may weaken tendons and predispose to rupture. It is better to give the condition more time to heal than to use them. HEAT AND COLD  Cold is used to relieve pain and reduce inflammation for acute and chronic Achilles tendinitis. Cold should be applied for 10 to 15 minutes every 2 to 3 hours for inflammation and pain and immediately after any activity that aggravates your symptoms. Use ice packs or an ice massage.  Heat may be used before performing stretching and strengthening activities prescribed by your caregiver. Use a heat pack or a warm soak. SEEK MEDICAL CARE IF:  Symptoms get worse or do not improve in 2 weeks despite treatment.  New, unexplained symptoms develop. Drugs used in treatment may produce side effects. EXERCISES RANGE OF MOTION (ROM) AND STRETCHING EXERCISES - Achilles Tendinitis  These exercises may help you when beginning to rehabilitate your injury. Your symptoms may resolve with or without further involvement from your physician, physical therapist or athletic trainer. While completing these exercises, remember:   Restoring tissue flexibility helps normal motion to return to the joints. This allows healthier, less painful movement and activity.  An effective stretch should be held for at least 30 seconds.  A stretch should never be painful. You should only feel a gentle lengthening or release in the stretched tissue. STRETCH - Gastroc, Standing   Place hands on wall.  Extend right / left leg, keeping the front knee somewhat bent.  Slightly point your toes inward on your back foot.  Keeping your right / left heel on the floor and your knee straight, shift your weight toward the wall, not allowing your back to arch.  You should feel a gentle stretch in the right / left calf. Hold this position for __________  seconds. Repeat __________ times. Complete this stretch __________ times per day. STRETCH - Soleus, Standing   Place hands on wall.  Extend right / left leg, keeping the other knee somewhat bent.  Slightly point your toes inward on your back foot.  Keep your right / left heel on the floor, bend your back knee, and slightly shift your weight over the back leg so that you feel a gentle stretch deep in your back calf.  Hold this position for __________ seconds. Repeat __________ times. Complete this stretch __________ times per day. STRETCH - Gastrocsoleus, Standing  Note: This exercise can place a lot of stress on your foot and ankle. Please complete this exercise only if specifically instructed by your caregiver.   Place the ball of your right / left foot on a step, keeping your other foot firmly on the same step.  Hold on to the wall or a rail for balance.  Slowly lift your other foot, allowing your body weight to press your heel down over the edge of the step.  You should feel a stretch in your right / left calf.  Hold this position for __________ seconds.  Repeat this exercise with a slight bend in your knee. Repeat __________ times. Complete this stretch __________ times per day.  STRENGTHENING EXERCISES - Achilles Tendinitis These exercises may help you when beginning to rehabilitate your injury. They may resolve your symptoms with or without further involvement from your physician, physical therapist or athletic trainer. While  completing these exercises, remember:   Muscles can gain both the endurance and the strength needed for everyday activities through controlled exercises.  Complete these exercises as instructed by your physician, physical therapist or athletic trainer. Progress the resistance and repetitions only as guided.  You may experience muscle soreness or fatigue, but the pain or discomfort you are trying to eliminate should never worsen during these exercises.  If this pain does worsen, stop and make certain you are following the directions exactly. If the pain is still present after adjustments, discontinue the exercise until you can discuss the trouble with your clinician. STRENGTH - Plantar-flexors   Sit with your right / left leg extended. Holding onto both ends of a rubber exercise band/tubing, loop it around the ball of your foot. Keep a slight tension in the band.  Slowly push your toes away from you, pointing them downward.  Hold this position for __________ seconds. Return slowly, controlling the tension in the band/tubing. Repeat __________ times. Complete this exercise __________ times per day.  STRENGTH - Plantar-flexors   Stand with your feet shoulder width apart. Steady yourself with a wall or table using as little support as needed.  Keeping your weight evenly spread over the width of your feet, rise up on your toes.*  Hold this position for __________ seconds. Repeat __________ times. Complete this exercise __________ times per day.  *If this is too easy, shift your weight toward your right / left leg until you feel challenged. Ultimately, you may be asked to do this exercise with your right / left foot only. STRENGTH - Plantar-flexors, Eccentric  Note: This exercise can place a lot of stress on your foot and ankle. Please complete this exercise only if specifically instructed by your caregiver.   Place the balls of your feet on a step. With your hands, use only enough support from a wall or rail to keep your balance.  Keep your knees straight and rise up on your toes.  Slowly shift your weight entirely to your right / left toes and pick up your opposite foot. Gently and with controlled movement, lower your weight through your right / left foot so that your heel drops below the level of the step. You will feel a slight stretch in the back of your calf at the end position.  Use the healthy leg to help rise up onto the balls of both  feet, then lower weight only on the right / left leg again. Build up to 15 repetitions. Then progress to 3 consecutive sets of 15 repetitions.*  After completing the above exercise, complete the same exercise with a slight knee bend (about 30 degrees). Again, build up to 15 repetitions. Then progress to 3 consecutive sets of 15 repetitions.* Perform this exercise __________ times per day.  *When you easily complete 3 sets of 15, your physician, physical therapist or athletic trainer may advise you to add resistance by wearing a backpack filled with additional weight. STRENGTH - Plantar Flexors, Seated   Sit on a chair that allows your feet to rest flat on the ground. If necessary, sit at the edge of the chair.  Keeping your toes firmly on the ground, lift your right / left heel as far as you can without increasing any discomfort in your ankle. Repeat __________ times. Complete this exercise __________ times a day. *If instructed by your physician, physical therapist or athletic trainer, you may add ____________________ of resistance by placing a weighted object on your right /  left knee. Document Released: 11/30/2004 Document Revised: 07/24/2011 Document Reviewed: 08/13/2008 [facility] Patient Information 2015 [place], [place]. This information is not intended to replace advice given to you by your health care provider. Make sure you discuss any questions you have with your health care provider. Plantar Fasciitis Plantar fasciitis is a common condition that causes foot pain. It is soreness (inflammation) of the band of tough fibrous tissue on the bottom of the foot that runs from the heel bone (calcaneus) to the ball of the foot. The cause of this soreness may be from excessive standing, poor fitting shoes, running on hard surfaces, being overweight, having an abnormal walk, or overuse (this is common in runners) of the painful foot or feet. It is also common in aerobic exercise dancers and ballet  dancers. SYMPTOMS  Most people with plantar fasciitis complain of:  Severe pain in the morning on the bottom of their foot especially when taking the first steps out of bed. This pain recedes after a few minutes of walking.  Severe pain is experienced also during walking following a long period of inactivity.  Pain is worse when walking barefoot or up stairs DIAGNOSIS   Your caregiver will diagnose this condition by examining and feeling your foot.  Special tests such as X-rays of your foot, are usually not needed. PREVENTION   Consult a sports medicine professional before beginning a new exercise program.  Walking programs offer a good workout. With walking there is a lower chance of overuse injuries common to runners. There is less impact and less jarring of the joints.  Begin all new exercise programs slowly. If problems or pain develop, decrease the amount of time or distance until you are at a comfortable level.  Wear good shoes and replace them regularly.  Stretch your foot and the heel cords at the back of the ankle (Achilles tendon) both before and after exercise.  Run or exercise on even surfaces that are not hard. For example, asphalt is better than pavement.  Do not run barefoot on hard surfaces.  If using a treadmill, vary the incline.  Do not continue to workout if you have foot or joint problems. Seek professional help if they do not improve. HOME CARE INSTRUCTIONS   Avoid activities that cause you pain until you recover.  Use ice or cold packs on the problem or painful areas after working out.  Only take over-the-counter or prescription medicines for pain, discomfort, or fever as directed by your caregiver.  Soft shoe inserts or athletic shoes with air or gel sole cushions may be helpful.  If problems continue or become more severe, consult a sports medicine caregiver or your own health care provider. Cortisone is a potent anti-inflammatory medication that  may be injected into the painful area. You can discuss this treatment with your caregiver. MAKE SURE YOU:   Understand these instructions.  Will watch your condition.  Will get help right away if you are not doing well or get worse. Document Released: 01/24/2001 Document Revised: 07/24/2011 Document Reviewed: 03/25/2008 [facility] Patient Information 2015 [place], [place]. This information is not intended to replace advice given to you by your health care provider. Make sure you discuss any questions you have with your health care provider.

## 2014-03-27 NOTE — Assessment & Plan Note (Signed)
A: Right ankle pain: I suspect achilles tendon injury and plantar fascitis.    Plan: Mobic once daily with food- oral antiinflammatory Topical antiinflammatory- diclofenac gel Elevate Compression with Rt ankle- ACE (or ACE -like bandage) wrapping every morning Exercise as illustrated below, twice daily.  Referral to sports medicine for ultrasound and to discuss other treatment options like nitroglycerin, injections etc.   F/u in 4-6 weeks

## 2014-04-17 ENCOUNTER — Ambulatory Visit: Payer: Self-pay | Admitting: Family Medicine

## 2014-04-17 ENCOUNTER — Encounter: Payer: Self-pay | Admitting: Family Medicine

## 2014-04-17 ENCOUNTER — Ambulatory Visit (INDEPENDENT_AMBULATORY_CARE_PROVIDER_SITE_OTHER): Payer: Self-pay | Admitting: Family Medicine

## 2014-04-17 VITALS — BP 138/91 | Ht 68.0 in | Wt 220.0 lb

## 2014-04-17 DIAGNOSIS — M25571 Pain in right ankle and joints of right foot: Secondary | ICD-10-CM | POA: Insufficient documentation

## 2014-04-17 DIAGNOSIS — M722 Plantar fascial fibromatosis: Secondary | ICD-10-CM

## 2014-04-17 NOTE — Progress Notes (Signed)
Patient ID: Kristi Cisneros, female   DOB: Aug 19, 1965, 48 y.o.   MRN: [id number] [facility] Attending Note: I have seen and examined this patient. I have discussed this patient with the resident and reviewed the assessment and plan as documented above. I agree with the resident's findings and plan. May ultimately benefit from custom molded orthotics. I really want her ankle(s) to increase in strength and try the scaphoid pads before venturing down that path so will see her back for further consideration of that.

## 2014-04-17 NOTE — Progress Notes (Signed)
Kristi BryantCandace D Cisneros 08/05/1965 48 y.o. [id number]  Subjective:   Plantar fascitis: Patient with bilateral plantar foot pain since July. She states it is worse in the morning and after sitting for a long time. It hurts to raise her foot to place on socks. She tried to take motrin for the pain without good relief. She had no injury to the area and has not started a new exercise course or have new shoes.   Right ankle pain: Pt states approximately 1 year ago she suffered from a bad ankle sprain after her foot went through her deck. Initially it became swollen, she had trouble weight bearing and it was "purple." She stated she went to the [facility] and xrays performed showed no fractures. She complains of pain on the lateral aspect of her ankle and part of her achilles. She reports the pain is worse with touch and movement of her ankle. Tylenol sometimes helps with the pain, but if it is bad enough then it does not help. She started Mobic 2 weeks ago, but does not notice a difference. She has tried ice and heat, with increase in pain with use.   Objective:  BP 138/91 mmHg  Ht 5\' 8"  (1.727 m)  Wt 220 lb (99.791 kg)  BMI 33.46 kg/m2  LMP 03/11/2014 Gen: Pleasant, caucasian female. NAD. Non-toxic in appearance. Well developed, well nourished. Obese.  Ext: No erythema. Mild soft tissue swelling lateral aspect of right ankle. TTP inferior and posterior to lateral malleolus. TTP bilateral proximal plantar fascia. Limited ROM in plantar and dorsiflexion (right) in comparison to other extremity. Increased pain with passive inversion(right). Neurovascularly intact distally.

## 2014-04-17 NOTE — Assessment & Plan Note (Signed)
Patient with chronic pain and swelling of the right ankle  - Likely increased weakness and pain from lack of therapy to the ankle after acute ankle sprain  injury 1 year ago.  - Ankle exercises/stretches given today.  - F/u 3-4 weeks.

## 2014-04-17 NOTE — Assessment & Plan Note (Signed)
Bilateral plantar fasciitis - Patient given exercises to complete today, they were discussed and demonstrated with her. - She is to continue the anti-inflammatories already prescribed. - We'll continue to monitor - Scaphoid pads placed in bilateral insoles, patient is to follow-up in 3-4 weeks if improvement with scaphoid pads and exercises will consider making custom orthotics. - Follow-up 3-4 weeks

## 2014-05-22 ENCOUNTER — Ambulatory Visit (INDEPENDENT_AMBULATORY_CARE_PROVIDER_SITE_OTHER): Payer: No Typology Code available for payment source | Admitting: Family Medicine

## 2014-05-22 ENCOUNTER — Encounter: Payer: Self-pay | Admitting: Family Medicine

## 2014-05-22 ENCOUNTER — Ambulatory Visit
Admission: RE | Admit: 2014-05-22 | Discharge: 2014-05-22 | Disposition: A | Discharge status: Home / Self Care | Payer: No Typology Code available for payment source | Source: Ambulatory Visit | Attending: Family Medicine | Admitting: Family Medicine

## 2014-05-22 VITALS — BP 126/87 | HR 97 | Ht 68.0 in | Wt 220.0 lb

## 2014-05-22 DIAGNOSIS — M7661 Achilles tendinitis, right leg: Secondary | ICD-10-CM

## 2014-05-22 DIAGNOSIS — M25561 Pain in right knee: Secondary | ICD-10-CM

## 2014-05-22 DIAGNOSIS — M722 Plantar fascial fibromatosis: Secondary | ICD-10-CM

## 2014-05-22 DIAGNOSIS — F17200 Nicotine dependence, unspecified, uncomplicated: Secondary | ICD-10-CM

## 2014-05-22 DIAGNOSIS — Z720 Tobacco use: Secondary | ICD-10-CM

## 2014-05-22 MED ORDER — METHYLPREDNISOLONE ACETATE 40 MG/ML IJ SUSP
40.0000 mg | Freq: Once | INTRAMUSCULAR | Status: AC
  Administered 2014-05-22: 40 mg via INTRA_ARTICULAR

## 2014-05-22 NOTE — Progress Notes (Signed)
Kristi Cisneros - 49 y.o. female MRN [id number]  Date of birth: 03-Nov-1965  Pt is here to follow up: CC: Right Foot/Ankle Pain Patient here for four-week follow-up of plantar fasciitis. She reports she has been performing her exercises and wearing the sports insoles that were fabricated at last visit. She reports overall doing significantly better but continues to have plantar heel pain first thing in the morning and has developed some mild posterior calcaneus pain as well however once again overall significantly improved from last visit.  Pt is also reporting a new problem:  Right knee pain 3-4 months of worsening diffuse knee pain. She has had worsening pain over the past 3-4 months. No known injury, no prior right-sided knee issues. She reports clicking and popping especially when walking up steps. No giving way, no locking. No significant effusion. Denies any fevers, chills recent. No numbness or tingling in lower extremities  ROS:  Per HPI.   HISTORY: Past Medical, Surgical, Social, and Family History Reviewed & Updated per EMR.  Pertinent Historical Findings include: Hypertension, hyperlipidemia on secondary prevention with Lipitor Previous bilateral plantar fasciitis and Achilles tendinitis Current everyday smoker, has cut back to approximately to half a pack per day  OBJECTIVE:  VS:   HT:5\' 8"  (172.7 cm)   WT:220 lb (99.791 kg)  BMI:33.5          BP:126/87 mmHg  HR:97bpm  TEMP: ( )  RESP:   PHYSICAL EXAM: GENERAL: Adult caucasian  female. In no discomfort; no respiratory distress   PSYCH: alert and appropriate, good insight   NEURO: sensation is intact to light touch in   VASCULAR: DP and TP pulses 1/4.  No significant edema.    Right Knee Exam: Appearance: Normal alignment, Normal Contours  Skin: No overlying erythema/ecchymosis.  Palpation: No significant effusion Patellar Grind: Normal Medial Joint Line: TTP Lateral Joint Line: Non-tender  Strength, ROM &  OtherTests: Varus/Valgus Strain: Stable but slight pain over medial joint line with valgus strain Anterior/Posterior Drawer: Normal Meniscal Testing: Normal McMurray's  Extensor Mechanism intact   Right Foot & Ankle Exam: Appearance: Forefoot alignment: neutral Hindfoot alignment: neutral Longitudinal Arch: Normal Transverse Arch: Early breakdown; splay toe of lateral column  Skin: No overlying erythema/ecchymosis.  Palpation: TTP over: Insertion of plantar fascia, mild pain over the Achilles insertion, no retrocalcaneal pain  No TTP over: Forefoot or tarsal bones Metatarsal Squeeze Test: Negative   Strength, ROM & OtherTests: Dorsiflexion to: Right = 90; Left = 85 Repeated Heel Raise: Able to do 1 footed heel raises 5 with mild pain    ASSESSMENT: 1. Right knee pain   2. Achilles tendinitis of right lower extremity   3. Current smoker   4. Plantar fasciitis, bilateral    Problem  Right Knee Pain   Likely medial meniscus. Suspect overall degenerative process X-rays ordered 05/22/2014:   Plantar Fasciitis, Bilateral   May be a candidate for custom orthotics in the future Currently using Green sports insoles with small scaphoid pads     PROCEDURE NOTE : Right Knee Injection After discussing the risks, benefits and expected outcomes of the injection and all questions were reviewed and answered,  she wished to undergo the above named procedure.  Written consent was obtained. After an appropriate time out was taken the Right Knee was sterilely prepped and injected as below: Prep:    Betadine and alcohol,  Ethel chloride.  Approach:  Anterior lateral Needle:  22-gauge 1.5 inch Meds:   3 mL of  1% lidocaine, 1 mL of 40 mg Depo-Medrol A bandaid was applied to the area. This procedure was well tolerated and there were no complications.    PLAN: See problem based charting & AVS for additional documentation.  Knee injection today  Standing x-rays right knee to evaluate for  degenerative changes  Discussed encouraged smoking cessation today  HEP: Continue eccentric heel raises and ensure full dorsiflexion given limited on exam; straight leg raise/quad sets > Return in about 6 weeks (around 07/03/2014) for Reevaluation of plantar fasciitis and right knee pain.

## 2014-05-26 ENCOUNTER — Telehealth: Payer: Self-pay | Admitting: *Deleted

## 2014-05-26 NOTE — Telephone Encounter (Signed)
Let me know if there is something I tell the pt.

## 2014-05-26 NOTE — Telephone Encounter (Signed)
-----   Message from Lizbeth BarkMelanie L Ceresi sent at 05/26/2014  2:33 PM EST ----- Regarding: xray results Contact: [phone] Pt called looking for knee xray results  [phone] home

## 2014-05-27 NOTE — Telephone Encounter (Signed)
[name] Please tell her it shows some arthritis as we expected but not terrible yet THANKS! Kristi LevySara Yazlynn Birkeland

## 2014-05-28 ENCOUNTER — Telehealth: Payer: Self-pay | Admitting: *Deleted

## 2014-05-28 NOTE — Telephone Encounter (Signed)
Called and explained to patient about her xray results, see notes per Dr. Jennette KettleNeal.

## 2014-07-01 ENCOUNTER — Ambulatory Visit (INDEPENDENT_AMBULATORY_CARE_PROVIDER_SITE_OTHER): Payer: 59 | Admitting: Sports Medicine

## 2014-07-01 ENCOUNTER — Encounter: Payer: Self-pay | Admitting: Sports Medicine

## 2014-07-01 VITALS — BP 139/83 | Ht 68.0 in | Wt 200.0 lb

## 2014-07-01 DIAGNOSIS — M25561 Pain in right knee: Secondary | ICD-10-CM

## 2014-07-01 NOTE — Progress Notes (Signed)
  Kristi Cisneros  SUBJECTIVE: CC: Right knee pain, follow-up HPI: Persistent right knee pain with minimal swelling. She reports her right foot and ankle are doing better but knee is essentially unchanged.  Reporting mechanical symptoms and symptoms of instability.  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 Feels safe.  Performing home exercises without significant improvement.  Pain is generalized and nonradiating.  ROS: Denies fevers, chills, numbness or tingling in lower extremities.  HISTORY:  Past Medical, Surgical, Social, and Family History reviewed & updated per EMR.  Pertinent Historical Findings include: Current everyday smoker. Meloxicam when necessary.  Historical Data Reviewed: Two-view x-ray right knee 05/22/2014: Medial joint line narrowing, with some tibial spine spurring  OBJECTIVE:  VS:   HT:5\' 8"  (172.7 cm)   WT:200 lb (90.719 kg)  BMI:30.5          BP:139/83 mmHg  HR: bpm  TEMP: ( )  RESP:   PHYSICAL EXAM:  GENERAL: Adult Caucasian. No acute distress PSYCH: Alert and appropriately interactive. VASCULAR: No significant pretibial edema NEURO: Sensation intact to light touch RIGHT KNEE: Chronic osteoarthritic changes/bossing.   10-15 mL effusion.   Patellar grind: Painful with crepitation   Ligament testing: Stable to varus and valgus strain. Painful with valgus strain. Stable to anterior posterior drawer.  Painful over medial and lateral joint line, positive McMurray's.  ASSESSMENT: 1. Right knee pain    PLAN: See problem based charting & AVS for additional documentation.  MRI right knee to evaluate for degenerative meniscal tear.  I will call her with these results. If positive for acute tear will benefit from orthopedic consultation to discuss options.  Continue prior HEP, quad sets.  6 inch Ace bandage provided today for  compression. Discussed use of Body Helix knee sleeve, patient would like to try Ace compression first > Will call with results

## 2014-07-10 ENCOUNTER — Ambulatory Visit
Admission: RE | Admit: 2014-07-10 | Discharge: 2014-07-10 | Disposition: A | Discharge status: Home / Self Care | Payer: 59 | Source: Ambulatory Visit | Attending: Sports Medicine | Admitting: Sports Medicine

## 2014-07-10 DIAGNOSIS — M25561 Pain in right knee: Secondary | ICD-10-CM

## 2014-07-13 ENCOUNTER — Telehealth: Payer: Self-pay | Admitting: Sports Medicine

## 2014-07-17 NOTE — Telephone Encounter (Signed)
Called and spoke with pt. Advanced Patellofemoral Arthritis. Will plan to see back in 4 weeks to re-evaluate and consider repeat aspiration & injection.

## 2014-08-19 ENCOUNTER — Ambulatory Visit (INDEPENDENT_AMBULATORY_CARE_PROVIDER_SITE_OTHER): Payer: 59 | Admitting: Sports Medicine

## 2014-08-19 ENCOUNTER — Encounter: Payer: Self-pay | Admitting: Sports Medicine

## 2014-08-19 VITALS — BP 132/94 | HR 100 | Ht 68.0 in | Wt 220.0 lb

## 2014-08-19 DIAGNOSIS — M13861 Other specified arthritis, right knee: Secondary | ICD-10-CM

## 2014-08-19 DIAGNOSIS — M1711 Unilateral primary osteoarthritis, right knee: Secondary | ICD-10-CM

## 2014-08-19 DIAGNOSIS — M25561 Pain in right knee: Secondary | ICD-10-CM

## 2014-08-19 MED ORDER — METHYLPREDNISOLONE ACETATE 40 MG/ML IJ SUSP
40.0000 mg | Freq: Once | INTRAMUSCULAR | Status: AC
  Administered 2014-08-19: 40 mg via INTRA_ARTICULAR

## 2014-08-19 NOTE — Assessment & Plan Note (Signed)
Discussed trial of repeat superior lateral injection. If no significant improvement could potentially be a candidate for a partial patellofemoral component replacement. She'll follow-up as needed. Hopefully this will provide her significant long lasting relief of greater than 3-4 months. He can repeat injections if they seem to be helpful and consider viscous supplementation in the future. Consider referral to [facility] for consideration of patellofemoral replacement

## 2014-08-19 NOTE — Progress Notes (Signed)
Kristi Cisneros - 49 y.o. female MRN [id number]  Date of birth: Sep 19, 1965  SUBJECTIVE: CC: 1.  right knee pain, follow-up      HPI:   persistent anterior knee pain that is worse with deep flexion and sitting on her knees.  She did have some relief following the last injection for approximate 6 weeks.  MRI reviewed as below  Denies any significant clicking, popping, locking or giving way.  Pain continues to be diffuse in origin  Denies any numbness, tingling or pain radiating down towards her foot or toes.   Not taking medications for this on a regular basis.   Does sit on her knees and go up and down steps quite often throughout the day.      ROS:  per HPI    HISTORY:  Past Medical, Surgical, Social, and Family History reviewed & updated per EMR.  Pertinent Historical Findings include: Social History   Occupational History  . [employer]      [employer]    Social History Main Topics  . Smoking status: Current Every Day Smoker -- 0.50 packs/day    Types: Cigarettes  . Smokeless tobacco: Not on file  . Alcohol Use: Yes     Comment: socially  . Drug Use: No  . Sexual Activity: Yes    Birth Control/ Protection: None    No specialty comments available. Problem  Patellofemoral Arthritis of Right Knee   X-rays 05/22/2014 medial joint line narrowing MRI 07/10/2014: Age advanced patellofemoral OA. No other intra-articular findings.   Right Knee Pain   Likely medial meniscus. Suspect overall degenerative process X-rays ordered 05/22/2014: Advance patellofemoral arthritis. Status post right knee Anteriolateral injection on 05/22/2014    OBJECTIVE:  VS:   HT:5\' 8"  (172.7 cm)   WT:220 lb (99.791 kg)  BMI:33.5          BP:(!) 132/94 mmHg  HR:100bpm  TEMP: ( )  RESP:   PHYSICAL EXAM: GENERAL: Adult obese Caucasian female. No acute distress PSYCH: Alert and appropriately interactive. SKIN: No open skin lesions or abnormal skin markings on areas inspected as  below VASCULAR: DP and PT pulses 2+/4. No significant pretibial edema. NEURO: Lower extremity strength is 5+/5 in all myotomes; sensation is intact to light touch in all dermatomes. RIGHT KNEE: Positive patellar grind. She has some bossing of the knee & questionable effusion. Stable to varus and valgus strain. No significant medial or lateral joint line tenderness but she does have pain over office fat pad symmetrically. No significant inferior pole patellar tenderness or pain at the insertion of the patellar tendon. She is stable to varus and valgus strain. No pain with McMurray's.  DATA OBTAINED: No notes on file  ASSESSMENT & PLAN: See problem based charting & AVS for additional documentation Problem List Items Addressed This Visit    Right knee pain - Primary    PROCEDURE NOTE : Ultrasound Guided right knee aspiration and injection The risks, benefits and expected outcomes of the injection were reviewed and she wishes to undergo the above named procedure.  Written consent was obtained. After an appropriate time out was taken, the ultrasound was used to identify the target structure and adjacent vascular structures. The right knee was then prepped in a sterile fashion using alcohol and a skin wheal was obtained using 3cc of 1% lidocaine on a 1.5 inch 27-gauge needle.  The area was cleaned again with alcohol and the US probe was prepped in sterile fashion with sterile ultrasound jelly.  Under direct visualization with real time Ultrasound guidance the target structure was injected as below:             Needle:  18-gauge 1.5 inch                Meds:  1 mL 40 mg Depo-Medrol, 1 mL half percent Marcaine, 1 mL 1% lidocaine             Images: Obtained No significant effusion was noted but there was a flash of synovial fluid was collected prior to the injection. A bandaid was applied to the area. This procedure was well tolerated and there were no complications.         Patellofemoral arthritis of  right knee    Discussed trial of repeat superior lateral injection. If no significant improvement could potentially be a candidate for a partial patellofemoral component replacement. She'll follow-up as needed. Hopefully this will provide her significant long lasting relief of greater than 3-4 months. He can repeat injections if they seem to be helpful and consider viscous supplementation in the future. Consider referral to [facility] for consideration of patellofemoral replacement      Relevant Medications   methylPREDNISolone acetate (DEPO-MEDROL) injection 40 mg (Completed)     FOLLOW UP:  Return if symptoms worsen or fail to improve.

## 2014-08-19 NOTE — Assessment & Plan Note (Signed)
PROCEDURE NOTE : Ultrasound Guided right knee aspiration and injection The risks, benefits and expected outcomes of the injection were reviewed and she wishes to undergo the above named procedure.  Written consent was obtained. After an appropriate time out was taken, the ultrasound was used to identify the target structure and adjacent vascular structures. The right knee was then prepped in a sterile fashion using alcohol and a skin wheal was obtained using 3cc of 1% lidocaine on a 1.5 inch 27-gauge needle.  The area was cleaned again with alcohol and the US probe was prepped in sterile fashion with sterile ultrasound jelly.  Under direct visualization with real time Ultrasound guidance the target structure was injected as below:             Needle:  18-gauge 1.5 inch                Meds:  1 mL 40 mg Depo-Medrol, 1 mL half percent Marcaine, 1 mL 1% lidocaine             Images: Obtained No significant effusion was noted but there was a flash of synovial fluid was collected prior to the injection. A bandaid was applied to the area. This procedure was well tolerated and there were no complications.

## 2014-08-25 ENCOUNTER — Telehealth: Payer: Self-pay | Admitting: Family Medicine

## 2014-08-25 NOTE — Telephone Encounter (Signed)
Patient has called in today to get a refill on all medications; patient was made and appointment with PCP for next week also; please f/u with patient about refill request

## 2014-08-26 ENCOUNTER — Other Ambulatory Visit: Payer: Self-pay | Admitting: *Deleted

## 2014-08-26 DIAGNOSIS — I10 Essential (primary) hypertension: Secondary | ICD-10-CM

## 2014-08-26 MED ORDER — HYDROCHLOROTHIAZIDE 25 MG PO TABS: 25.0000 mg | ORAL_TABLET | Freq: Every day | ORAL | Status: DC

## 2014-08-26 NOTE — Telephone Encounter (Signed)
Pt scheduled follow up appt but is out of bp meds, please f/u with pt.

## 2014-08-26 NOTE — Telephone Encounter (Signed)
Rx refill send to [facility]  Pt aware  

## 2014-09-03 ENCOUNTER — Ambulatory Visit: Payer: 59 | Attending: Family Medicine | Admitting: Family Medicine

## 2014-09-03 ENCOUNTER — Encounter: Payer: Self-pay | Admitting: Family Medicine

## 2014-09-03 VITALS — BP 116/82 | HR 100 | Temp 98.4°F | Resp 18 | Ht 68.0 in | Wt 235.0 lb

## 2014-09-03 DIAGNOSIS — N920 Excessive and frequent menstruation with regular cycle: Secondary | ICD-10-CM

## 2014-09-03 DIAGNOSIS — I10 Essential (primary) hypertension: Secondary | ICD-10-CM

## 2014-09-03 MED ORDER — HYDROCHLOROTHIAZIDE 25 MG PO TABS
25.0000 mg | ORAL_TABLET | Freq: Every day | ORAL | Status: DC
Start: 2014-09-03 — End: 2015-04-05

## 2014-09-03 MED ORDER — IBUPROFEN 200 MG PO TABS: 600.0000 mg | ORAL_TABLET | Freq: Three times a day (TID) | ORAL | Status: DC | PRN

## 2014-09-03 NOTE — Patient Instructions (Addendum)
[name],  1. Heavy menstrual:  Advil 600 mg three times daily with food one the day before your period and day 1 and day 2 of period to reduce bleeding and cramping.   2. HTN:  BP is at goal of < 140/90 Continue HCTZ  Switch from can foods to frozen food.   F/u in 6 weeks for pap smear  Dr. Armen PickupFunches

## 2014-09-03 NOTE — Progress Notes (Signed)
F/U HTN Medicine refills

## 2014-09-07 NOTE — Assessment & Plan Note (Signed)
HTN:  BP is at goal of < 140/90 Continue HCTZ  Switch from can foods to frozen food.

## 2014-09-07 NOTE — Progress Notes (Signed)
   Subjective:    Patient ID: Kristi Cisneros, female    DOB: 1965/09/12, 49 y.o.   MRN: [id number] CC; f./u HTN  HPI  1. CHRONIC HYPERTENSION  Disease Monitoring  Blood pressure range: does not check   Chest pain: no   Dyspnea: no   Claudication: no   Medication compliance: yes  Medication Side Effects  Lightheadedness: no   Urinary frequency: no   Edema: no    Preventitive Healthcare:  Exercise: yes   Diet Pattern: regular meals   Salt Restriction: no   2. Heavy menses: last cycle heavy bleeding with cramping. Took some aleve which helped. No bleeding currently. Periods not previously heavy.   Soc Hx: current smoker  Review of Systems As per HPI     Objective:   Physical Exam BP 116/82 mmHg  Pulse 100  Temp(Src) 98.4 F (36.9 C) (Oral)  Resp 18  Ht 5\' 8"  (1.727 m)  Wt 235 lb (106.595 kg)  BMI 35.74 kg/m2  SpO2 98%  LMP 08/30/2014 General appearance: alert, cooperative and no distress Lungs: clear to auscultation bilaterally Heart: regular rate and rhythm, S1, S2 normal, no murmur, click, rub or gallop Extremities: extremities normal, atraumatic, no cyanosis or edema       Assessment & Plan:

## 2014-09-07 NOTE — Assessment & Plan Note (Signed)
1. Heavy menstrual:  Advil 600 mg three times daily with food one the day before your period and day 1 and day 2 of period to reduce bleeding and cramping.

## 2014-09-24 ENCOUNTER — Ambulatory Visit: Payer: 59 | Attending: Physician Assistant | Admitting: Physician Assistant

## 2014-09-24 VITALS — BP 113/75 | HR 98 | Wt 232.0 lb

## 2014-09-24 DIAGNOSIS — S30827A Blister (nonthermal) of anus, initial encounter: Secondary | ICD-10-CM

## 2014-09-24 NOTE — Progress Notes (Signed)
Chief Complaint: "my butt is sore"  Subjective: This is a 49 year old white female who presents with soreness to the right of her anal area. She states it shall in the last couple of days she's noticed it. Feels like it is growing in size. There is no drainage. There is no blood. His been no fevers.  She sexually active with the same partner for +20 years. No lesions on her vaginal area. Regular menstrual cycles.   ROS:  GEN: denies fever or chills, denies change in weight Skin: + lesions or rashes   Objective:  Filed Vitals:   09/24/14 1012  BP: 113/75  Pulse: 98  Weight: 232 lb (105.235 kg)  SpO2: 100%    Physical Exam:  General: in no acute distress. HEENT: no pallor, no icterus, moist oral mucosa, no JVD, no lymphadenopathy Anal: 1cm oval shaped blister, pustule, no drainage, tender to the right of anus, no blood Neuro: Alert, awake, oriented x3, nonfocal.   Medications: Prior to Admission medications   Medication Sig Start Date End Date Taking? Authorizing Provider  acetaminophen (TYLENOL) 500 MG tablet Take 500 mg by mouth every 6 (six) hours as needed.   Yes Historical Provider, MD  atorvastatin (LIPITOR) 40 MG tablet Take 1 tablet (40 mg total) by mouth daily. 03/06/14  Yes Josalyn Funches, MD  diclofenac sodium (VOLTAREN) 1 % GEL Apply 1 application topically 4 (four) times daily. 03/27/14  Yes Josalyn Funches, MD  hydrochlorothiazide (HYDRODIURIL) 25 MG tablet Take 1 tablet (25 mg total) by mouth daily. 09/03/14  Yes Josalyn Funches, MD  ibuprofen (ADVIL) 200 MG tablet Take 3 tablets (600 mg total) by mouth every 8 (eight) hours as needed. Patient not taking: Reported on 09/24/2014 09/03/14   Dessa PhiJosalyn Funches, MD    Assessment: 1. Rectal blister  Plan: Referral to Dermatology  Follow up:as scheduled with Dr. Armen PickupFunches  The patient was given clear instructions to go to ER or return to medical center if symptoms don't improve, worsen or new problems develop. The  patient verbalized understanding. The patient was told to call to get lab results if they haven't heard anything in the next week.   This note has been created with Education officer, environmentalDragon speech recognition software and smart phrase technology. Any transcriptional errors are unintentional.   Scot Juniffany Kerry Odonohue, PA-C 09/24/2014, 10:25 AM

## 2014-10-01 ENCOUNTER — Ambulatory Visit: Payer: 59 | Attending: Family Medicine | Admitting: Family Medicine

## 2014-10-01 ENCOUNTER — Other Ambulatory Visit: Payer: Self-pay

## 2014-10-01 VITALS — BP 135/90 | HR 95

## 2014-10-01 DIAGNOSIS — M5489 Other dorsalgia: Secondary | ICD-10-CM | POA: Diagnosis not present

## 2014-10-01 DIAGNOSIS — R079 Chest pain, unspecified: Secondary | ICD-10-CM | POA: Insufficient documentation

## 2014-10-01 DIAGNOSIS — Z8249 Family history of ischemic heart disease and other diseases of the circulatory system: Secondary | ICD-10-CM | POA: Diagnosis not present

## 2014-10-01 DIAGNOSIS — R0789 Other chest pain: Secondary | ICD-10-CM

## 2014-10-01 DIAGNOSIS — I10 Essential (primary) hypertension: Secondary | ICD-10-CM | POA: Insufficient documentation

## 2014-10-01 MED ORDER — ESOMEPRAZOLE MAGNESIUM 40 MG PO CPDR: 40.0000 mg | DELAYED_RELEASE_CAPSULE | Freq: Every day | ORAL | Status: DC

## 2014-10-01 NOTE — Progress Notes (Signed)
Subjective:     Patient ID: Kristi Cisneros, female   DOB: 05-10-1966, 49 y.o.   MRN: [id number]  HPI   Patient with a history of hypertension presents today with chest pain off and on for 3 days. Her mother has a history of CAD and stenting but she is unsure of her age when this happened. She reports having the chest pain about 2-3 times a day lasting for maybe 30 minutes at a time. She has been worrying about it for 2 days now. She reports some burping but no distinct heart burn. She has noted no aggravating factors. It usually occurs when she is sitting and does not bother her at night. Thinking it might be reflux, she took her mother's protonix last night. She also took some advil and one of the two seemed to give her some relief. She denies any nausea, vominting, faintness. The pain is across her anterior chest and across her shoulder blades.  She does work as a Solicitorhousekeeper and yard worker. She did a good bit of mowing and raking a day or two before she noticed this. She denies that movement intensifies the discomfort.Her current medications are HCTZ and Lipitor.    Review of Systems   Negative except as in HPI.     Objective:   Physical Exam   General:  Alert, oriented, appropriated, in no distress. SKIN:  Warm and dry. Lungs:  Clear to auscultation Heart:   Regular w/o m.g,r Musculoskeletal:  She does admit to some pain with passive ROM of her upper extremeties but only in the back component. EKG: Shows no evidence for ischemic event. There is a slightly prolonged QT     Assessment:     Chest and upper back pain, probably musculoskeletal, possibly related to reflux    Plan:     Nexium 40 mg, #30, one po q day Keep track of any further episodes of chest discomfort and notice what seems to proceed the pain and anyother associated symptoms. Discussed and gave written instructions on symptoms that would prompt a visit to ED.

## 2014-10-01 NOTE — Patient Instructions (Signed)
Go to ED for chest pain with nausea, vomiting,sweating, faintness. Follow-up here as needed for continued pain/ Take nexium daily\ Keep diary of pain and what seems to precede it and what seems to relieve it.

## 2014-10-16 ENCOUNTER — Telehealth: Payer: Self-pay | Admitting: Sports Medicine

## 2014-10-16 NOTE — Telephone Encounter (Signed)
Please call pt and inform her we are going to try to pre-approve her for Synvisc 1.  If we are unable to do this we will need to refer her to [facility] to discuss partial knee replacement.  Andrena MewsMichael D Lissy Deuser, DO [facility] 10/16/2014 3:32 PM

## 2014-10-16 NOTE — Telephone Encounter (Signed)
-----   Message from Claiborne BillingsMartha J Delaney sent at 10/14/2014  1:59 PM EDT ----- Contact: [phone] Pain has returned since injection.  Knee is giving out.  Realizes that it is too soon for another injection but wants to know what she can do.  Can call home at [phone] or cell [phone].

## 2014-10-28 ENCOUNTER — Encounter: Payer: Self-pay | Admitting: Sports Medicine

## 2014-10-28 ENCOUNTER — Ambulatory Visit (INDEPENDENT_AMBULATORY_CARE_PROVIDER_SITE_OTHER): Payer: 59 | Admitting: Sports Medicine

## 2014-10-28 VITALS — BP 149/91 | HR 89 | Ht 66.0 in | Wt 220.0 lb

## 2014-10-28 DIAGNOSIS — M1711 Unilateral primary osteoarthritis, right knee: Secondary | ICD-10-CM | POA: Diagnosis not present

## 2014-10-28 DIAGNOSIS — M13861 Other specified arthritis, right knee: Secondary | ICD-10-CM | POA: Diagnosis not present

## 2014-10-28 MED ORDER — SODIUM HYALURONATE (VISCOSUP) 25 MG/2.5ML IX SOSY
25.0000 mg | PREFILLED_SYRINGE | Freq: Once | INTRA_ARTICULAR | Status: AC
  Administered 2014-10-28: 25 mg via INTRA_ARTICULAR

## 2014-10-28 NOTE — Progress Notes (Signed)
  Kristi Cisneros - 49 y.o. female MRN [id number]  Date of birth: 08-16-1965  SUBJECTIVE: Including CC, HPI, ROS HISTORY:  Kristi Cisneros is a 49 y.o. female presenting today for Initial Supartz Injection.  she has radiographic evidence of Osteoarthritis/Degenerative Joint Disease of the knee.  she has undergone corticosteroid injection previously without sufficient relief from pain.   The risks, benefits, and expected outcomes of the injection were reviewed and she wishes to undergo a therapeutic trial of Supartz.     No specialty comments available. Social History   Occupational History  . [employer]      [employer]    Social History Main Topics  . Smoking status: Current Every Day Smoker -- 0.50 packs/day    Types: Cigarettes  . Smokeless tobacco: Not on file  . Alcohol Use: Yes     Comment: socially  . Drug Use: No  . Sexual Activity: Yes    Birth Control/ Protection: None      Problem  Patellofemoral Arthritis of Right Knee   X-rays 05/22/2014 medial joint line narrowing MRI 07/10/2014: Age advanced patellofemoral OA. No other intra-articular findings. SUPARTZ SERIES STARTED - 10/28/2014     OBJECTIVE: HT:5\' 6"  (167.6 cm) WT:220 lb (99.791 kg) BMI:35.6 BP:(!) 149/91 mmHg HR:89bpm TEMP: ( ) RESP:  PHYSICAL EXAM: GENERAL: Adult obese Caucasian female. No acute distress PSYCH: Alert and appropriately interactive. SKIN: No open skin lesions or abnormal skin markings on areas inspected as below RIGHT KNEE: Positive patellar grind. She has some bossing of the knee. No effusion on PE and confirmed with [place] prior to injection. No significant medial or lateral joint line tenderness.     ASSESSMENT & PLAN: See Patient instructions for additional information Problem List Items Addressed This Visit    Patellofemoral arthritis of right knee - Primary    PROCEDURE NOTE : Ultrasound Guided Knee Injection The risks, benefits and expected outcomes of the injection were  reviewed and she wishes to undergo the above named procedure.  Written consent was obtained. After an appropriate time out was taken, the ultrasound was used to identify the target structure and adjacent vascular structures. The right knee was then prepped in a sterile fashion and subsequently injected as below: Prep:  Alcohol, sterile [place] gel and tegaderm  Approach: Superiolateral  Anesthes  Ethel chloride; 3cc 1% lidocaine  Needle:  18g 1.5 inch  Aspirate:  n/a  Meds:  Supartz  Images:  not stored  Dressing:  bandaid  This procedure was well tolerated and there were no complications.     Today's injection was her first.  The patient is to return in 1 week for repeat injections.      Relevant Medications   Sodium Hyaluronate SOSY 25 mg (Completed)      FOLLOW UP:  Return in about 1 week (around 11/04/2014).

## 2014-10-28 NOTE — Assessment & Plan Note (Signed)
PROCEDURE NOTE : Ultrasound Guided Knee Injection The risks, benefits and expected outcomes of the injection were reviewed and she wishes to undergo the above named procedure.  Written consent was obtained. After an appropriate time out was taken, the ultrasound was used to identify the target structure and adjacent vascular structures. The right knee was then prepped in a sterile fashion and subsequently injected as below: Prep:  Alcohol, sterile [place] gel and tegaderm  Approach: Superiolateral  Anesthes  Ethel chloride; 3cc 1% lidocaine  Needle:  18g 1.5 inch  Aspirate:  n/a  Meds:  Supartz  Images:  not stored  Dressing:  bandaid  This procedure was well tolerated and there were no complications.     Today's injection was her first.  The patient is to return in 1 week for repeat injections.

## 2014-11-04 ENCOUNTER — Ambulatory Visit (INDEPENDENT_AMBULATORY_CARE_PROVIDER_SITE_OTHER): Payer: 59 | Admitting: Sports Medicine

## 2014-11-04 ENCOUNTER — Encounter: Payer: Self-pay | Admitting: Sports Medicine

## 2014-11-04 VITALS — BP 130/88 | Ht 66.0 in | Wt 220.0 lb

## 2014-11-04 DIAGNOSIS — M1711 Unilateral primary osteoarthritis, right knee: Secondary | ICD-10-CM | POA: Diagnosis not present

## 2014-11-04 DIAGNOSIS — M13861 Other specified arthritis, right knee: Secondary | ICD-10-CM

## 2014-11-04 MED ORDER — SODIUM HYALURONATE (VISCOSUP) 25 MG/2.5ML IX SOSY
2.5000 mL | PREFILLED_SYRINGE | Freq: Once | INTRA_ARTICULAR | Status: AC
  Administered 2014-11-04: 2.5 mL via INTRA_ARTICULAR

## 2014-11-04 NOTE — Progress Notes (Signed)
  Kristi Cisneros - 49 y.o. female MRN [id number]  Date of birth: 06/19/1965   SUPARTZ INJECTION   Kristi Cisneros is a 49 y.o. female presenting today for Follow-Up Supartz Injection.  she has radiographic evidence of Osteoarthritis/Degenerative Joint Disease of the knee.  she has undergone corticosteroid injection previously without sufficient relief from pain.   The risks, benefits, and expected outcomes of the injection were reviewed and she wishes to undergo a therapeutic trial of Supartz.   The knee was prepped in a clean fashion the knee was injected from a bent knee anterolateral approach with a 22g. A bandaid was applied to the area.  This procedure was well tolerated and there were no complications.  Today's injection was her second.  The patient is to return in 1 week for repeat final injection.

## 2014-11-04 NOTE — Addendum Note (Signed)
Addended by: Annita Brod on: 11/04/2014 12:57 PM   Modules accepted: Orders

## 2014-11-11 ENCOUNTER — Encounter: Payer: Self-pay | Admitting: Sports Medicine

## 2014-11-11 ENCOUNTER — Ambulatory Visit (INDEPENDENT_AMBULATORY_CARE_PROVIDER_SITE_OTHER): Payer: 59 | Admitting: Sports Medicine

## 2014-11-11 VITALS — BP 159/87 | HR 93 | Ht 66.0 in | Wt 220.0 lb

## 2014-11-11 DIAGNOSIS — M13861 Other specified arthritis, right knee: Secondary | ICD-10-CM | POA: Diagnosis not present

## 2014-11-11 DIAGNOSIS — M1711 Unilateral primary osteoarthritis, right knee: Secondary | ICD-10-CM

## 2014-11-11 MED ORDER — SODIUM HYALURONATE (VISCOSUP) 25 MG/2.5ML IX SOSY
2.5000 mL | PREFILLED_SYRINGE | Freq: Once | INTRA_ARTICULAR | Status: AC
  Administered 2014-11-11: 2.5 mL via INTRA_ARTICULAR

## 2014-11-11 NOTE — Progress Notes (Signed)
  Kristi BryantCandace D Cisneros - 49 y.o. female MRN [id number]  Date of birth: 11-Apr-1966   SUPARTZ INJECTION   Kristi Cisneros is a 49 y.o. female presenting today for Follow-Up Supartz Injection.  she has radiographic evidence of Osteoarthritis/Degenerative Joint Disease of the knee.  she has undergone corticosteroid injection previously without sufficient relief from pain.   The risks, benefits, and expected outcomes of the injection were reviewed and she wishes to undergo a therapeutic trial of Supartz.   The knee was prepped in a clean fashion the knee was injected from a bent knee anterolateral approach with a 22g. A bandaid was applied to the area.  This procedure was well tolerated and there were no complications.  Today's injection was her third and final.  The patient is to return prn.

## 2014-11-20 ENCOUNTER — Ambulatory Visit: Payer: 59 | Attending: Family Medicine | Admitting: Family Medicine

## 2014-11-20 ENCOUNTER — Encounter: Payer: Self-pay | Admitting: Family Medicine

## 2014-11-20 ENCOUNTER — Other Ambulatory Visit: Payer: Self-pay

## 2014-11-20 VITALS — BP 131/85 | HR 98 | Temp 98.2°F | Resp 18 | Ht 68.0 in | Wt 237.0 lb

## 2014-11-20 DIAGNOSIS — M25512 Pain in left shoulder: Secondary | ICD-10-CM | POA: Diagnosis not present

## 2014-11-20 DIAGNOSIS — R229 Localized swelling, mass and lump, unspecified: Secondary | ICD-10-CM | POA: Diagnosis not present

## 2014-11-20 DIAGNOSIS — F17200 Nicotine dependence, unspecified, uncomplicated: Secondary | ICD-10-CM | POA: Insufficient documentation

## 2014-11-20 DIAGNOSIS — Z720 Tobacco use: Secondary | ICD-10-CM

## 2014-11-20 DIAGNOSIS — Z79899 Other long term (current) drug therapy: Secondary | ICD-10-CM | POA: Insufficient documentation

## 2014-11-20 DIAGNOSIS — R200 Anesthesia of skin: Secondary | ICD-10-CM | POA: Insufficient documentation

## 2014-11-20 DIAGNOSIS — I10 Essential (primary) hypertension: Secondary | ICD-10-CM | POA: Insufficient documentation

## 2014-11-20 DIAGNOSIS — R222 Localized swelling, mass and lump, trunk: Secondary | ICD-10-CM | POA: Insufficient documentation

## 2014-11-20 DIAGNOSIS — E559 Vitamin D deficiency, unspecified: Secondary | ICD-10-CM

## 2014-11-20 DIAGNOSIS — R208 Other disturbances of skin sensation: Secondary | ICD-10-CM | POA: Diagnosis not present

## 2014-11-20 LAB — VITAMIN B12: VITAMIN B 12: 452 pg/mL (ref 211–911)

## 2014-11-20 MED ORDER — VARENICLINE TARTRATE 0.5 MG X 11 & 1 MG X 42 PO MISC: ORAL | Status: DC

## 2014-11-20 MED ORDER — NAPROXEN 500 MG PO TABS: 500.0000 mg | ORAL_TABLET | Freq: Two times a day (BID) | ORAL | Status: DC

## 2014-11-20 NOTE — Assessment & Plan Note (Signed)
Smoking:  Smoking cessation support: smoking cessation hotline: 1-800-QUIT-NOW.  Smoking cessation classes are available through [facility]. Call [phone] or visit our website at [email or web address]. Start chantix  Set quit date for 8-35 days after starting chantix

## 2014-11-20 NOTE — Patient Instructions (Addendum)
Ms. Chestine SporeClark,  Thank you for coming in today  1. L shoulder pain: Suspect overuse injury  Giving smoking and HTN hx: need to obtain EKG.  EKG was normal   2. HTN: BP well controlled Continue HCTZ  2. Smoking:  Smoking cessation support: smoking cessation hotline: 1-800-QUIT-NOW.  Smoking cessation classes are available through [facility]. Call [phone] or visit our website at [email or web address]. Start chantix  Set quit date for 8-35 days after starting chantix  F/u in 3 weeks for BP check F/u with me in 6 weeks for L shoulder pain and smoking cessation  Dr. Armen PickupFunches

## 2014-11-20 NOTE — Assessment & Plan Note (Signed)
L shoulder pain: Suspect overuse injury  Giving smoking and HTN hx: need to obtain EKG.  EKG was normal

## 2014-11-20 NOTE — Progress Notes (Signed)
   Subjective:    Patient ID: Kristi Cisneros, female    DOB: [birth year], 49 y.o.   MRN: [id number] CC: HTN f/u, "knot" on L side of chest  HPI 49 yo F with hx of HTN:  1. CHRONIC HYPERTENSION  Disease Monitoring  [facility]  Chest pain: no   Dyspnea: no   Claudication: no   Medication compliance: yes  Medication Side Effects  Lightheadedness: no   Urinary frequency: no   Edema: no     2. L shoulder pain: x 2 weeks. With numbness in both hands. Has hx of carpal tunnel during pregnancy. Works in Education officer, environmentalcleaning. Using her arms a lot. No injury. No CP or SOB.  3. L upper chest nodule: noted yesterday. No trauma. Area is not enlarging. No skin redness. No hx of mammograms.   Soc Hx: current smoker   Current Outpatient Prescriptions on File Prior to Visit  Medication Sig Dispense Refill  . acetaminophen (TYLENOL) 500 MG tablet Take 500 mg by mouth every 6 (six) hours as needed.    Marland Kitchen. atorvastatin (LIPITOR) 40 MG tablet Take 1 tablet (40 mg total) by mouth daily. 90 tablet 3  . diclofenac sodium (VOLTAREN) 1 % GEL Apply 1 application topically 4 (four) times daily. 100 g 3  . esomeprazole (NEXIUM) 40 MG capsule Take 1 capsule (40 mg total) by mouth daily. 30 capsule 3  . hydrochlorothiazide (HYDRODIURIL) 25 MG tablet Take 1 tablet (25 mg total) by mouth daily. 30 tablet 11  . ibuprofen (ADVIL) 200 MG tablet Take 3 tablets (600 mg total) by mouth every 8 (eight) hours as needed. 30 tablet 0   No current facility-administered medications on file prior to visit.   Review of Systems  Constitutional: Negative for fever and chills.  Respiratory: Negative for shortness of breath.   Cardiovascular: Negative for chest pain.  Gastrointestinal: Negative for abdominal pain and blood in stool.  Skin: Negative for rash.  Psychiatric/Behavioral: Negative for suicidal ideas and dysphoric mood.      Objective:   Physical Exam BP 131/85 mmHg  Pulse 98  Temp(Src) 98.2 F  (36.8 C) (Oral)  Resp 18  Ht 5\' 8"  (1.727 m)  Wt 237 lb (107.502 kg)  BMI 36.04 kg/m2  SpO2 100%  LMP 11/10/2014  BP Readings from Last 3 Encounters:  11/20/14 131/85  11/11/14 159/87  11/04/14 130/88  General appearance: alert, cooperative and no distress Lungs: clear to auscultation bilaterally Heart: regular rate and rhythm, S1, S2 normal, no murmur, click, rub or gallop Extremities: extremities normal, atraumatic, no cyanosis or edema  Skin: L upper chest with 1 x 1 cm subcutaneous nodule, non tenderness, firm   EKG: normal EKG, normal sinus rhythm.     Assessment & Plan:

## 2014-11-20 NOTE — Progress Notes (Signed)
Elevated BP at [facility] on last visit  Complaining of knot on lt side of chest area

## 2014-11-20 NOTE — Assessment & Plan Note (Signed)
   HTN: BP well controlled Continue HCTZ

## 2014-11-21 LAB — VITAMIN D 25 HYDROXY (VIT D DEFICIENCY, FRACTURES): Vit D, 25-Hydroxy: 18 ng/mL — ABNORMAL LOW (ref 30–100)

## 2014-11-23 DIAGNOSIS — E559 Vitamin D deficiency, unspecified: Secondary | ICD-10-CM | POA: Insufficient documentation

## 2014-11-23 MED ORDER — VITAMIN D (ERGOCALCIFEROL) 1.25 MG (50000 UNIT) PO CAPS: 50000.0000 [IU] | ORAL_CAPSULE | ORAL | Status: DC

## 2014-11-23 NOTE — Assessment & Plan Note (Signed)
A; vit D def P: with treat with 8 week of vit D3

## 2014-11-23 NOTE — Addendum Note (Signed)
Addended by: Dessa PhiFUNCHES, Sabree Nuon on: 11/23/2014 09:42 AM   Modules accepted: Orders

## 2014-11-25 ENCOUNTER — Telehealth: Payer: Self-pay | Admitting: *Deleted

## 2014-11-25 NOTE — Telephone Encounter (Signed)
-----   Message from Dessa PhiJosalyn Funches, MD sent at 11/23/2014  9:40 AM EDT ----- Vit D deficiency B12 normal

## 2014-11-25 NOTE — Telephone Encounter (Signed)
LVM to return call.

## 2014-12-14 ENCOUNTER — Encounter: Payer: Self-pay | Admitting: Family Medicine

## 2015-01-20 ENCOUNTER — Telehealth: Payer: Self-pay

## 2015-01-20 NOTE — Telephone Encounter (Signed)
Wilkie Aye with [facility] called and left message on voicemail requesting referral to be changed. Nurse called Wilkie Aye at [facility]. Person answering telephone will have Kristy call Herbert Seta with [facility] to clarify needs.

## 2015-01-21 ENCOUNTER — Other Ambulatory Visit (HOSPITAL_COMMUNITY): Payer: Self-pay | Admitting: Family

## 2015-01-21 ENCOUNTER — Other Ambulatory Visit (HOSPITAL_COMMUNITY): Payer: Self-pay | Admitting: Orthopedic Surgery

## 2015-01-25 ENCOUNTER — Encounter (HOSPITAL_COMMUNITY): Payer: Self-pay | Admitting: *Deleted

## 2015-01-26 ENCOUNTER — Encounter (HOSPITAL_COMMUNITY)
Admission: RE | Disposition: A | Discharge status: Home / Self Care | Payer: Self-pay | Source: Ambulatory Visit | Attending: Orthopedic Surgery

## 2015-01-26 ENCOUNTER — Ambulatory Visit (HOSPITAL_COMMUNITY): Payer: 59 | Admitting: Anesthesiology

## 2015-01-26 ENCOUNTER — Encounter (HOSPITAL_COMMUNITY): Payer: Self-pay | Admitting: Anesthesiology

## 2015-01-26 ENCOUNTER — Ambulatory Visit (HOSPITAL_COMMUNITY)
Admission: RE | Admit: 2015-01-26 | Discharge: 2015-01-26 | Disposition: A | Discharge status: Home / Self Care | Payer: 59 | Source: Ambulatory Visit | Attending: Orthopedic Surgery | Admitting: Orthopedic Surgery

## 2015-01-26 DIAGNOSIS — F17210 Nicotine dependence, cigarettes, uncomplicated: Secondary | ICD-10-CM | POA: Insufficient documentation

## 2015-01-26 DIAGNOSIS — I10 Essential (primary) hypertension: Secondary | ICD-10-CM | POA: Insufficient documentation

## 2015-01-26 DIAGNOSIS — M1711 Unilateral primary osteoarthritis, right knee: Secondary | ICD-10-CM | POA: Insufficient documentation

## 2015-01-26 HISTORY — DX: Gastro-esophageal reflux disease without esophagitis: K21.9

## 2015-01-26 HISTORY — PX: KNEE ARTHROSCOPY: SHX127

## 2015-01-26 HISTORY — DX: Unspecified osteoarthritis, unspecified site: M19.90

## 2015-01-26 LAB — CBC
HEMATOCRIT: 40.5 % (ref 36.0–46.0)
Hemoglobin: 13.7 g/dL (ref 12.0–15.0)
MCH: 31.2 pg (ref 26.0–34.0)
MCHC: 33.8 g/dL (ref 30.0–36.0)
MCV: 92.3 fL (ref 78.0–100.0)
Platelets: 254 10*3/uL (ref 150–400)
RBC: 4.39 MIL/uL (ref 3.87–5.11)
RDW: 13.4 % (ref 11.5–15.5)
WBC: 5.8 10*3/uL (ref 4.0–10.5)

## 2015-01-26 LAB — BASIC METABOLIC PANEL
Anion gap: 9 (ref 5–15)
BUN: 18 mg/dL (ref 6–20)
CO2: 24 mmol/L (ref 22–32)
Calcium: 9.8 mg/dL (ref 8.9–10.3)
Chloride: 106 mmol/L (ref 101–111)
Creatinine, Ser: 0.83 mg/dL (ref 0.44–1.00)
GFR calc Af Amer: >60 mL/min (ref >60)
GLUCOSE: 93 mg/dL (ref 65–99)
POTASSIUM: 3.9 mmol/L (ref 3.5–5.1)
Sodium: 139 mmol/L (ref 135–145)

## 2015-01-26 LAB — HCG, SERUM, QUALITATIVE: Preg, Serum: NEGATIVE

## 2015-01-26 SURGERY — ARTHROSCOPY, KNEE
Anesthesia: General | Site: Knee | Laterality: Right

## 2015-01-26 MED ORDER — ONDANSETRON HCL 4 MG/2ML IJ SOLN
INTRAMUSCULAR | Status: DC | PRN
  Administered 2015-01-26: 4 mg via INTRAVENOUS

## 2015-01-26 MED ORDER — KETOROLAC TROMETHAMINE 30 MG/ML IJ SOLN
INTRAMUSCULAR | Status: AC
  Filled 2015-01-26: qty 2

## 2015-01-26 MED ORDER — FENTANYL CITRATE (PF) 250 MCG/5ML IJ SOLN
INTRAMUSCULAR | Status: AC
  Filled 2015-01-26: qty 5

## 2015-01-26 MED ORDER — LIDOCAINE HCL (CARDIAC) 20 MG/ML IV SOLN
INTRAVENOUS | Status: DC | PRN
  Administered 2015-01-26: 80 mg via INTRAVENOUS

## 2015-01-26 MED ORDER — DEXAMETHASONE SODIUM PHOSPHATE 10 MG/ML IJ SOLN
INTRAMUSCULAR | Status: DC | PRN
  Administered 2015-01-26: 10 mg via INTRAVENOUS

## 2015-01-26 MED ORDER — MORPHINE SULFATE (PF) 4 MG/ML IV SOLN
INTRAVENOUS | Status: DC | PRN
  Administered 2015-01-26: 8 mg

## 2015-01-26 MED ORDER — ACETAMINOPHEN 10 MG/ML IV SOLN
INTRAVENOUS | Status: DC | PRN
  Administered 2015-01-26: 1000 mg via INTRAVENOUS

## 2015-01-26 MED ORDER — FENTANYL CITRATE (PF) 100 MCG/2ML IJ SOLN
25.0000 ug | INTRAMUSCULAR | Status: DC | PRN
  Administered 2015-01-26 (×2): 50 ug via INTRAVENOUS

## 2015-01-26 MED ORDER — CEFAZOLIN SODIUM-DEXTROSE 2-3 GM-% IV SOLR
2.0000 g | INTRAVENOUS | Status: AC
  Administered 2015-01-26: 2 g via INTRAVENOUS
  Filled 2015-01-26: qty 50

## 2015-01-26 MED ORDER — KETOROLAC TROMETHAMINE 30 MG/ML IJ SOLN
INTRAMUSCULAR | Status: DC | PRN
  Administered 2015-01-26: 30 mg via INTRAVENOUS

## 2015-01-26 MED ORDER — PROPOFOL 10 MG/ML IV BOLUS
INTRAVENOUS | Status: AC
  Filled 2015-01-26: qty 20

## 2015-01-26 MED ORDER — OXYCODONE-ACETAMINOPHEN 5-325 MG PO TABS
3.0000 | ORAL_TABLET | Freq: Once | ORAL | Status: AC
  Administered 2015-01-26: 3 via ORAL

## 2015-01-26 MED ORDER — CHLORHEXIDINE GLUCONATE 4 % EX LIQD: 60.0000 mL | Freq: Once | CUTANEOUS | Status: DC

## 2015-01-26 MED ORDER — ACETAMINOPHEN 10 MG/ML IV SOLN: INTRAVENOUS | Status: DC | PRN

## 2015-01-26 MED ORDER — DEXAMETHASONE SODIUM PHOSPHATE 10 MG/ML IJ SOLN
INTRAMUSCULAR | Status: AC
  Filled 2015-01-26: qty 1

## 2015-01-26 MED ORDER — PROMETHAZINE HCL 25 MG/ML IJ SOLN: 6.2500 mg | INTRAMUSCULAR | Status: DC | PRN

## 2015-01-26 MED ORDER — LACTATED RINGERS IV SOLN
INTRAVENOUS | Status: DC | PRN
  Administered 2015-01-26 (×2): via INTRAVENOUS

## 2015-01-26 MED ORDER — PROPOFOL 10 MG/ML IV BOLUS
INTRAVENOUS | Status: DC | PRN
  Administered 2015-01-26: 200 mg via INTRAVENOUS

## 2015-01-26 MED ORDER — ACETAMINOPHEN 10 MG/ML IV SOLN
INTRAVENOUS | Status: AC
  Filled 2015-01-26: qty 100

## 2015-01-26 MED ORDER — FENTANYL CITRATE (PF) 100 MCG/2ML IJ SOLN
INTRAMUSCULAR | Status: AC
  Administered 2015-01-26: 50 ug via INTRAVENOUS
  Filled 2015-01-26: qty 4

## 2015-01-26 MED ORDER — CLONIDINE HCL (ANALGESIA) 100 MCG/ML EP SOLN
EPIDURAL | Status: DC | PRN
  Administered 2015-01-26: 1 mL

## 2015-01-26 MED ORDER — SODIUM CHLORIDE 0.9 % IR SOLN
Status: DC | PRN
  Administered 2015-01-26: 6000 mL

## 2015-01-26 MED ORDER — OXYCODONE-ACETAMINOPHEN 5-325 MG PO TABS
ORAL_TABLET | ORAL | Status: AC
  Administered 2015-01-26: 3 via ORAL
  Filled 2015-01-26: qty 3

## 2015-01-26 MED ORDER — LACTATED RINGERS IV SOLN
INTRAVENOUS | Status: DC
  Administered 2015-01-26: 14:00:00 via INTRAVENOUS

## 2015-01-26 MED ORDER — OXYCODONE-ACETAMINOPHEN 10-325 MG PO TABS: 1.0000 | ORAL_TABLET | Freq: Four times a day (QID) | ORAL | Status: DC | PRN

## 2015-01-26 MED ORDER — MIDAZOLAM HCL 5 MG/5ML IJ SOLN
INTRAMUSCULAR | Status: DC | PRN
  Administered 2015-01-26: 2 mg via INTRAVENOUS

## 2015-01-26 MED ORDER — BUPIVACAINE HCL (PF) 0.25 % IJ SOLN
INTRAMUSCULAR | Status: DC | PRN
  Administered 2015-01-26: 30 mL

## 2015-01-26 MED ORDER — MIDAZOLAM HCL 2 MG/2ML IJ SOLN
INTRAMUSCULAR | Status: AC
  Filled 2015-01-26: qty 4

## 2015-01-26 MED ORDER — BUPIVACAINE-EPINEPHRINE (PF) 0.25% -1:200000 IJ SOLN
INTRAMUSCULAR | Status: AC
  Filled 2015-01-26: qty 30

## 2015-01-26 MED ORDER — MORPHINE SULFATE (PF) 4 MG/ML IV SOLN
INTRAVENOUS | Status: AC
  Filled 2015-01-26: qty 2

## 2015-01-26 MED ORDER — CLONIDINE HCL (ANALGESIA) 100 MCG/ML EP SOLN
150.0000 ug | EPIDURAL | Status: DC
  Filled 2015-01-26: qty 1.5

## 2015-01-26 MED ORDER — FENTANYL CITRATE (PF) 100 MCG/2ML IJ SOLN
INTRAMUSCULAR | Status: DC | PRN
  Administered 2015-01-26: 50 ug via INTRAVENOUS
  Administered 2015-01-26: 100 ug via INTRAVENOUS
  Administered 2015-01-26 (×2): 50 ug via INTRAVENOUS

## 2015-01-26 SURGICAL SUPPLY — 56 items
BANDAGE ELASTIC 6 VELCRO ST LF (GAUZE/BANDAGES/DRESSINGS) IMPLANT
BANDAGE ESMARK 6X9 LF (GAUZE/BANDAGES/DRESSINGS) IMPLANT
BLADE CUDA 5.5 (BLADE) IMPLANT
BLADE GREAT WHITE 4.2 (BLADE) ×1 IMPLANT
BLADE GREAT WHITE 4.2MM (BLADE) ×1
BLADE SURG ROTATE 9660 (MISCELLANEOUS) IMPLANT
BNDG CMPR 9X6 STRL LF SNTH (GAUZE/BANDAGES/DRESSINGS) ×1
BNDG CMPR MED 15X6 ELC VLCR LF (GAUZE/BANDAGES/DRESSINGS) ×1
BNDG ELASTIC 6X15 VLCR STRL LF (GAUZE/BANDAGES/DRESSINGS) ×2 IMPLANT
BNDG ESMARK 6X9 LF (GAUZE/BANDAGES/DRESSINGS) ×3
BUR OVAL 6.0 (BURR) IMPLANT
COVER SURGICAL LIGHT HANDLE (MISCELLANEOUS) ×3 IMPLANT
CUFF TOURNIQUET SINGLE 34IN LL (TOURNIQUET CUFF) ×2 IMPLANT
CUFF TOURNIQUET SINGLE 44IN (TOURNIQUET CUFF) IMPLANT
DRAPE ARTHROSCOPY W/POUCH 114 (DRAPES) ×3 IMPLANT
DRAPE INCISE IOBAN 66X45 STRL (DRAPES) IMPLANT
DRAPE PROXIMA HALF (DRAPES) IMPLANT
DRAPE U-SHAPE 47X51 STRL (DRAPES) ×3 IMPLANT
DRSG PAD ABDOMINAL 8X10 ST (GAUZE/BANDAGES/DRESSINGS) ×4 IMPLANT
DURAPREP 26ML APPLICATOR (WOUND CARE) ×3 IMPLANT
GAUZE SPONGE 4X4 12PLY STRL (GAUZE/BANDAGES/DRESSINGS) ×2 IMPLANT
GAUZE XEROFORM 1X8 LF (GAUZE/BANDAGES/DRESSINGS) ×2 IMPLANT
GLOVE BIOGEL PI IND STRL 8 (GLOVE) ×1 IMPLANT
GLOVE BIOGEL PI INDICATOR 8 (GLOVE) ×2
GLOVE SURG ORTHO 8.0 STRL STRW (GLOVE) ×3 IMPLANT
GOWN STRL REUS W/ TWL LRG LVL3 (GOWN DISPOSABLE) ×2 IMPLANT
GOWN STRL REUS W/ TWL XL LVL3 (GOWN DISPOSABLE) ×1 IMPLANT
GOWN STRL REUS W/TWL LRG LVL3 (GOWN DISPOSABLE) ×6
GOWN STRL REUS W/TWL XL LVL3 (GOWN DISPOSABLE) ×3
KIT BASIN OR (CUSTOM PROCEDURE TRAY) ×3 IMPLANT
KIT ROOM TURNOVER OR (KITS) ×3 IMPLANT
MANIFOLD NEPTUNE II (INSTRUMENTS) ×2 IMPLANT
NDL 18GX1X1/2 (RX/OR ONLY) (NEEDLE) IMPLANT
NDL HYPO 25GX1X1/2 BEV (NEEDLE) ×1 IMPLANT
NEEDLE 18GX1X1/2 (RX/OR ONLY) (NEEDLE) ×3 IMPLANT
NEEDLE HYPO 25GX1X1/2 BEV (NEEDLE) ×3 IMPLANT
NS IRRIG 1000ML POUR BTL (IV SOLUTION) IMPLANT
PACK ARTHROSCOPY DSU (CUSTOM PROCEDURE TRAY) ×3 IMPLANT
PAD ARMBOARD 7.5X6 YLW CONV (MISCELLANEOUS) ×6 IMPLANT
PADDING CAST COTTON 6X4 STRL (CAST SUPPLIES) ×3 IMPLANT
SET ARTHROSCOPY TUBING (MISCELLANEOUS) ×3
SET ARTHROSCOPY TUBING LN (MISCELLANEOUS) ×1 IMPLANT
SPONGE LAP 4X18 X RAY DECT (DISPOSABLE) ×1 IMPLANT
SUT ETHILON 3 0 PS 1 (SUTURE) ×2 IMPLANT
SUT MENISCAL KIT (KITS) IMPLANT
SYR 20ML ECCENTRIC (SYRINGE) ×3 IMPLANT
SYR 30ML LL (SYRINGE) ×2 IMPLANT
SYR 3ML LL SCALE MARK (SYRINGE) ×2 IMPLANT
SYR CONTROL 10ML LL (SYRINGE) IMPLANT
SYR TB 1ML LUER SLIP (SYRINGE) ×1 IMPLANT
TOWEL OR 17X24 6PK STRL BLUE (TOWEL DISPOSABLE) ×3 IMPLANT
TOWEL OR 17X26 10 PK STRL BLUE (TOWEL DISPOSABLE) ×3 IMPLANT
TUBE CONNECTING 12'X1/4 (SUCTIONS) ×1
TUBE CONNECTING 12X1/4 (SUCTIONS) ×2 IMPLANT
WAND HAND CNTRL MULTIVAC 90 (MISCELLANEOUS) ×1 IMPLANT
WATER STERILE IRR 1000ML POUR (IV SOLUTION) ×3 IMPLANT

## 2015-01-26 NOTE — Anesthesia Procedure Notes (Signed)
Procedure Name: LMA Insertion Date/Time: 01/26/2015 4:18 PM Performed by: Wray Kearns A Pre-anesthesia Checklist: Patient identified, Emergency Drugs available, Suction available, Patient being monitored and Timeout performed Patient Re-evaluated:Patient Re-evaluated prior to inductionOxygen Delivery Method: Circle system utilized Preoxygenation: Pre-oxygenation with 100% oxygen Intubation Type: IV induction Ventilation: Mask ventilation without difficulty LMA: LMA inserted LMA Size: 4.0 Tube type: Oral Number of attempts: 1 Placement Confirmation: positive ETCO2 and breath sounds checked- equal and bilateral Tube secured with: Tape Dental Injury: Teeth and Oropharynx as per pre-operative assessment

## 2015-01-26 NOTE — Anesthesia Preprocedure Evaluation (Signed)
Anesthesia Evaluation  Patient identified by MRN, date of birth, ID band Patient awake    Reviewed: Allergy & Precautions, NPO status , Patient's Chart, lab work & pertinent test results  Airway Mallampati: III  TM Distance: >3 FB Neck ROM: Full    Dental  (+) Teeth Intact, Dental Advisory Given   Pulmonary Current Smoker (0.5 PPD),    Pulmonary exam normal breath sounds clear to auscultation       Cardiovascular Exercise Tolerance: Good hypertension, Pt. on medications (-) angina(-) Past MI Normal cardiovascular exam Rhythm:Regular Rate:Normal     Neuro/Psych negative neurological ROS     GI/Hepatic Neg liver ROS, GERD  Medicated and Controlled,  Endo/Other  Obesity   Renal/GU negative Renal ROS     Musculoskeletal negative musculoskeletal ROS (+) Arthritis , Osteoarthritis,    Abdominal   Peds  Hematology negative hematology ROS (+)   Anesthesia Other Findings Day of surgery medications reviewed with the patient.  Reproductive/Obstetrics                             Anesthesia Physical Anesthesia Plan  ASA: II  Anesthesia Plan: General   Post-op Pain Management:    Induction: Intravenous  Airway Management Planned: LMA  Additional Equipment:   Intra-op Plan:   Post-operative Plan: Extubation in OR  Informed Consent: I have reviewed the patients History and Physical, chart, labs and discussed the procedure including the risks, benefits and alternatives for the proposed anesthesia with the patient or authorized representative who has indicated his/her understanding and acceptance.   Dental advisory given  Plan Discussed with: CRNA  Anesthesia Plan Comments: (Risks/benefits of general anesthesia discussed with patient including risk of damage to teeth, lips, gum, and tongue, nausea/vomiting, allergic reactions to medications, and the possibility of heart attack, stroke and  death.  All patient questions answered.  Patient wishes to proceed.)        Anesthesia Quick Evaluation

## 2015-01-26 NOTE — Anesthesia Postprocedure Evaluation (Signed)
  Anesthesia Post-op Note  Patient: Kristi Cisneros  Procedure(s) Performed: Procedure(s): ARTHROSCOPY KNEE, DEBRIDEMENT (Right)  Patient Location: PACU  Anesthesia Type:General  Level of Consciousness: awake, alert  and oriented  Airway and Oxygen Therapy: Patient Spontanous Breathing  Post-op Pain: mild  Post-op Assessment: Post-op Vital signs reviewed, Patient's Cardiovascular Status Stable, Respiratory Function Stable, Patent Airway, No signs of Nausea or vomiting and Pain level controlled     RLE Motor Response: Responds to commands, Purposeful movement RLE Sensation: Full sensation      Post-op Vital Signs: Reviewed and stable  Last Vitals:  Filed Vitals:   01/26/15 1845  BP:   Pulse: 63  Temp: 36.7 C  Resp:     Complications: No apparent anesthesia complications

## 2015-01-26 NOTE — Transfer of Care (Signed)
Immediate Anesthesia Transfer of Care Note  Patient: Kristi Cisneros  Procedure(s) Performed: Procedure(s): ARTHROSCOPY KNEE, DEBRIDEMENT (Right)  Patient Location: PACU  Anesthesia Type:General  Level of Consciousness: awake, alert , oriented, patient cooperative and responds to stimulation  Airway & Oxygen Therapy: Patient Spontanous Breathing and Patient connected to nasal cannula oxygen  Post-op Assessment: Report given to RN and Post -op Vital signs reviewed and stable  Post vital signs: Reviewed and stable  Last Vitals:  Filed Vitals:   01/26/15 1301  BP: 167/86  Pulse: 94  Temp: 36.7 C  Resp: 20    Complications: No apparent anesthesia complications

## 2015-01-26 NOTE — H&P (Signed)
Kristi Cisneros is an 49 y.o. female.   Chief Complaint: Right knee pain HPI: Kristi Cisneros is a 49 year old female with right knee pain. She describes long history of right knee pain primarily anterior. She's had multiple cortisone injections. She reports some mechanical pain in the knee. She describes no patellar instability but just reports primarily pain. 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 MRI scan demonstrates chondral wear in the trochlea and patella. She presents now for operative management as an initial step in the management of this problem. No family history or personal history of DVT or pulmonary embolism  Past Medical History  Diagnosis Date  . Hypertension Dx 2015  . GERD (gastroesophageal reflux disease)   . Arthritis     Past Surgical History  Procedure Laterality Date  . No past surgeries      Family History  Problem Relation Age of Onset  . Hypertension Mother   . Diabetes Mother   . Hypertension Brother    Social History:  reports that she has been smoking Cigarettes.  She has a 10 pack-year smoking history. She does not have any smokeless tobacco history on file. She reports that she does not drink alcohol or use illicit drugs.  Allergies:  Allergies  Allergen Reactions  . Levaquin [Levofloxacin In D5w] Other (See Comments)    "makes me feel Weird"    No prescriptions prior to admission    No results found for this or any previous visit (from the past 48 hour(s)). No results found.  Review of Systems  Constitutional: Negative.   HENT: Negative.   Eyes: Negative.   Respiratory: Negative.   Cardiovascular: Negative.   Gastrointestinal: Negative.   Genitourinary: Negative.   Musculoskeletal: Positive for joint pain.  Skin: Negative.   Neurological: Negative.   Endo/Heme/Allergies: Negative.   Psychiatric/Behavioral: Negative.     Last menstrual period 01/15/2015. Physical Exam  Constitutional: She  appears well-developed.  HENT:  Head: Normocephalic.  Eyes: Pupils are equal, round, and reactive to light.  Neck: Normal range of motion.  Cardiovascular: Normal rate.   Respiratory: Effort normal.  Neurological: She is alert.  Skin: Skin is warm.  Psychiatric: She has a normal mood and affect.   examination of the right knee demonstrates intact skin palpable pedal pulses full range of motion patellofemoral crepitus is actually slightly worse on the left than the right collateral crucial ligaments are stable trace effusion is present patellar retinacular tenderness is present   Assessment/Plan Impression is patellofemoral arthritis and chondral wear in the patellofemoral joint plan: Talked with Kristi Cisneros today about operative and nonoperative management of this problem. She's had multiple injections as well as gel injections which have not given her sustain relief. For her line of work it would be difficult to jump immediately to patellofemoral arthroplasty or total knee replacement due to the amount of kneeling that's required. A reasonable first step at this time would be debridement and potential microfracture of this area. Patient understands the risk and benefits primarily including incomplete pain relief. Risk and benefits are discussed all questions answered  Allah Reason SCOTT 01/26/2015, 7:07 AM

## 2015-01-26 NOTE — Progress Notes (Signed)
Orthopedic Tech Progress Note Patient Details:  Kristi Cisneros 13-May-1966 [id number]  Ortho Devices Type of Ortho Device: Crutches Ortho Device/Splint Interventions: Ordered, Adjustment   Jennye Moccasin 01/26/2015, 7:21 PM

## 2015-01-26 NOTE — Brief Op Note (Signed)
01/26/2015  5:11 PM  PATIENT:  Kristi Cisneros  49 y.o. female  PRE-OPERATIVE DIAGNOSIS:  RIGHT KNEE PATELLA FEMORAL OSTEOARTHRITIS  POST-OPERATIVE DIAGNOSIS:  RIGHT KNEE PATELLA FEMORAL OSTEOARTHRITIS  PROCEDURE:  Procedure(s): ARTHROSCOPY KNEE, DEBRIDEMENT  SURGEON:  Surgeon(s): Cammy Copa, MD  ASSISTANT: Suzan Slick RNFA  ANESTHESIA:   general  EBL: 5 ml    Total I/O In: 1000 [I.V.:1000] Out: -   BLOOD ADMINISTERED: none  DRAINS: none   LOCAL MEDICATIONS USED: Marcaine morphine and clonidine  SPECIMEN:  No Specimen  COUNTS:  YES  TOURNIQUET:    DICTATION: .Other Dictation: Dictation Number [id number]  PLAN OF CARE: Discharge to home after PACU  PATIENT DISPOSITION:  PACU - hemodynamically stable

## 2015-01-27 ENCOUNTER — Encounter (HOSPITAL_COMMUNITY): Payer: Self-pay | Admitting: Orthopedic Surgery

## 2015-01-27 NOTE — Op Note (Signed)
NAME:  Kristi Cisneros, Kristi Cisneros               ACCOUNT NO.:  [id number]  MEDICAL RECORD NO.:  [id number]  LOCATION:  [facility]                         FACILITY:  [facility]  PHYSICIAN:  Burnard Bunting, M.D.    DATE OF BIRTH:  1965-07-08  DATE OF PROCEDURE:  01/26/2015 DATE OF DISCHARGE:  01/26/2015                              OPERATIVE REPORT   PREOPERATIVE DIAGNOSIS:  Right knee patellofemoral chondral defect.  POSTOPERATIVE DIAGNOSIS:  Right knee patellofemoral chondral defect.  PROCEDURE:  Right knee arthroscopy with debridement of trochlear chondral defect, microfracture of lesion, which measures approximately 1 x 1 cm.  SURGEON:  Burnard Bunting, M.D.  ASSISTANT:  Patrick Jupiter, RNFA.  INDICATIONS:  [name] is the patient with right knee pain, fairly intractable who presents for operative management after explanation of risks and benefits.  FINDINGS: 1. Examination under anesthesia.  On range of motion, full extension,     full flexion, stable to varus valgus stress.  Collaterals are     stable.  Patella is stable medially and laterally in both full     extension and 20 degrees of flexion. 2. Diagnostic arthroscopy. 3. Intact ACL and PCL. 4. 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  Lateral compartment cartilage which     measures about 8 mm. 5. Intact medial meniscus and medial compartment articular cartilage. 6. Approximately 1.5 cm2 chondral defect, full-thickness on the     trochlea, which was stellate in appearance with corresponding grade     1 or 2 changes on the undersurface of the patella, but no exposed     subchondral bone in this area. 7. No loose bodies in medial or lateral gutter.  PROCEDURE IN DETAIL:  The patient was brought to the operating room, where general endotracheal anesthesia was induced.  Preoperative antibiotics were administered.  Time-out was called.  Right leg was prescrubbed with alcohol and  Betadine, allowed to air dry, prepped with DuraPrep solution draped in sterile manner.  Anterior, inferior, and medial portals were infiltrated with Marcaine with epinephrine and then the portals were created.  Diagnostic arthroscopy was performed.  Medial compartment was intact.  ACL and PCL intact.  Lateral compartment had a little bit of cleft in the tibial articular cartilage, but the femoral articular cartilage was intact.  Meniscus was intact.  Undersurface of the patella had grade 1 changes.  Grade 1 or 2 changes on the medial facet primarily.  The patient did have full-thickness chondral defect with loose flaps on the trochlea just distal to the landing zone.  These chondral flaps were debrided.  Calcified cartilage layer was removed. Microfracture performed.  Good bleeding was obtained from the bone.  At this time, thorough irrigation was performed.  Instruments were removed.  Portals were closed using 3-0 nylon.  A solution of Marcaine, morphine, clonidine were injected to the knee.  Bulky wrap applied.     Burnard Bunting, M.D.     GSD/MEDQ  D:  01/26/2015  T:  01/27/2015  Job:  [id number]

## 2015-02-22 ENCOUNTER — Other Ambulatory Visit: Payer: Self-pay | Admitting: Family Medicine

## 2015-03-31 ENCOUNTER — Other Ambulatory Visit: Payer: Self-pay | Admitting: Family Medicine

## 2015-03-31 DIAGNOSIS — E785 Hyperlipidemia, unspecified: Secondary | ICD-10-CM

## 2015-03-31 NOTE — Telephone Encounter (Signed)
Nurse called patient at home number, patient's at work, left message with person answering telephone for patient to return call to [facility] at [phone]. Nurse called patient on mobile number, reached voicemail. Left message for patient to call Taisia Fantini with [facility], at [phone]. Nurse called patient to make patient aware of 1 month supply for atorvastatin sent to pharmacy. Patient needs appointment for additional refills.

## 2015-04-01 ENCOUNTER — Telehealth: Payer: Self-pay

## 2015-04-01 NOTE — Telephone Encounter (Signed)
Patient returned call to nurse, patient verified date of birth. Patient aware of 1 month refill for atorvastatin sent to pharmacy. Patient agrees to make follow up appointment with Dr. Armen PickupFunches for HTN and hyperlipidemia. Nurse transferred patient to front office staff to schedule appointment.

## 2015-04-05 ENCOUNTER — Encounter: Payer: Self-pay | Admitting: Family Medicine

## 2015-04-05 ENCOUNTER — Ambulatory Visit: Payer: 59 | Attending: Family Medicine | Admitting: Family Medicine

## 2015-04-05 VITALS — BP 125/84 | HR 103 | Temp 98.4°F | Resp 16 | Ht 68.0 in | Wt 240.0 lb

## 2015-04-05 DIAGNOSIS — F17210 Nicotine dependence, cigarettes, uncomplicated: Secondary | ICD-10-CM | POA: Insufficient documentation

## 2015-04-05 DIAGNOSIS — B9789 Other viral agents as the cause of diseases classified elsewhere: Secondary | ICD-10-CM

## 2015-04-05 DIAGNOSIS — K219 Gastro-esophageal reflux disease without esophagitis: Secondary | ICD-10-CM | POA: Diagnosis not present

## 2015-04-05 DIAGNOSIS — Z79899 Other long term (current) drug therapy: Secondary | ICD-10-CM | POA: Diagnosis not present

## 2015-04-05 DIAGNOSIS — J069 Acute upper respiratory infection, unspecified: Secondary | ICD-10-CM | POA: Diagnosis not present

## 2015-04-05 DIAGNOSIS — E785 Hyperlipidemia, unspecified: Secondary | ICD-10-CM | POA: Diagnosis not present

## 2015-04-05 DIAGNOSIS — I10 Essential (primary) hypertension: Secondary | ICD-10-CM

## 2015-04-05 MED ORDER — ESOMEPRAZOLE MAGNESIUM 40 MG PO CPDR: 40.0000 mg | DELAYED_RELEASE_CAPSULE | Freq: Every day | ORAL | Status: DC

## 2015-04-05 MED ORDER — ATORVASTATIN CALCIUM 40 MG PO TABS: 40.0000 mg | ORAL_TABLET | Freq: Every day | ORAL | Status: DC

## 2015-04-05 MED ORDER — HYDROCHLOROTHIAZIDE 25 MG PO TABS: 25.0000 mg | ORAL_TABLET | Freq: Every day | ORAL | Status: DC

## 2015-04-05 MED ORDER — GUAIFENESIN-CODEINE 100-10 MG/5ML PO SOLN
10.0000 mL | Freq: Four times a day (QID) | ORAL | Status: DC | PRN
Start: 2015-04-05 — End: 2016-05-23

## 2015-04-05 NOTE — Progress Notes (Signed)
F/U Hyperlipemia, HTN C/C possible Sinuce infection, HA Pain scale #2 No suicidal thought in the past two weeks Tobacco user 1/2 ppday

## 2015-04-05 NOTE — Assessment & Plan Note (Signed)
  You have a viral URI with cough. For this please do the following:  1. Drink plenty of fluids. Hot tea, soup etc will help open your nasal passages. 2. Dextromethorphan 30 mg every 6-8 hrs (plain Robitussin) for cough suppression. You can take the Robitussin-DM (with decongestant for 3-5 days)  3. Tylenol for pain up to 1000 mg three times daily.  4. Nasal saline-OTC nose spray for congestion.

## 2015-04-05 NOTE — Progress Notes (Signed)
Subjective:  Patient ID: [name], female    DOB: 1965-06-23  Age: 49 y.o. MRN: [id number]  CC: Hyperlipidemia and Hypertension   HPI Kristi Cisneros presents for    1. CHRONIC HYPERTENSION  Disease Monitoring  Blood pressure range: not checking   Chest pain: no   Dyspnea: no   Claudication: no   Medication compliance: yes  Medication Side Effects  Lightheadedness: no   Urinary frequency: no   Edema: no  2. HDL: taking lipitor. Requesting refills. No muscle aches or pain with lipitor.  3. Smoking: not taking chantix. Her mother recently quit cold [place]. Patient down to 1/2 PPD. Patient plans to quit.    3. Sinus pressure: x  3 days with cough. Cough in mildly productive. No fever, no CP or SOB. No earache. Sick contact of 34 months old grandson a week ago.   Social History  Substance Use Topics  . Smoking status: Current Every Day Smoker -- 0.50 packs/day for 20 years    Types: Cigarettes  . Smokeless tobacco: Not on file  . Alcohol Use: No     Comment: socially- ocassional    Outpatient Prescriptions Prior to Visit  Medication Sig Dispense Refill  . atorvastatin (LIPITOR) 40 MG tablet TAKE 1 TABLET BY MOUTH DAILY. 30 tablet 0  . esomeprazole (NEXIUM) 40 MG capsule Take 1 capsule (40 mg total) by mouth daily. 30 capsule 3  . hydrochlorothiazide (HYDRODIURIL) 25 MG tablet Take 1 tablet (25 mg total) by mouth daily. 30 tablet 11  . ibuprofen (ADVIL) 200 MG tablet Take 3 tablets (600 mg total) by mouth every 8 (eight) hours as needed. (Patient taking differently: Take 200 mg by mouth every 8 (eight) hours as needed for headache, mild pain or moderate pain. ) 30 tablet 0  . oxyCODONE-acetaminophen (PERCOCET) 10-325 MG per tablet Take 1-2 tablets by mouth every 6 (six) hours as needed for pain. 60 tablet 0  . varenicline (CHANTIX STARTING MONTH PAK) 0.5 MG X 11 & 1 MG X 42 tablet Per packet insert (Patient not taking: Reported on 01/21/2015) 53 tablet 0  . Vitamin D,  Ergocalciferol, (DRISDOL) 50000 UNITS CAPS capsule Take 1 capsule (50,000 Units total) by mouth every 7 (seven) days. For 8 weeks 4 capsule 1   No facility-administered medications prior to visit.    ROS Review of Systems  Constitutional: Negative for fever and chills.  HENT: Positive for sinus pressure. Negative for sore throat.   Eyes: Negative for visual disturbance.  Respiratory: Positive for cough. Negative for shortness of breath.   Cardiovascular: Negative for chest pain.  Gastrointestinal: Negative for abdominal pain and blood in stool.  Musculoskeletal: Negative for myalgias, back pain and arthralgias.  Skin: Negative for rash.  Allergic/Immunologic: Negative for immunocompromised state.  Hematological: Negative for adenopathy. Does not bruise/bleed easily.  Psychiatric/Behavioral: Negative for suicidal ideas and dysphoric mood.    Objective:  BP 125/84 mmHg  Pulse 103  Temp(Src) 98.4 F (36.9 C) (Oral)  Resp 16  Ht  (1.727 m)  Wt 240 lb (108.863 kg)  BMI 36.50 kg/m2  SpO2 100%  LMP 04/05/2015  BP/Weight 04/05/2015 01/26/2015 11/20/2014  Systolic BP 125 128 131  Diastolic BP 84 96 85  Wt. (Lbs) 240 - 237  BMI 36.5 - 36.04   Physical Exam  Constitutional: She is oriented to person, place, and time. She appears well-developed and well-nourished. No distress.  HENT:  Head: Normocephalic and atraumatic.  Cardiovascular: Normal rate, regular  rhythm, normal heart sounds and intact distal pulses.   Pulmonary/Chest: Effort normal and breath sounds normal.  Musculoskeletal: She exhibits no edema.  Neurological: She is alert and oriented to person, place, and time.  Skin: Skin is warm and dry. No rash noted.  Psychiatric: She has a normal mood and affect.     Assessment & Plan:   Problem List Items Addressed This Visit    GERD (gastroesophageal reflux disease) (Chronic)   Relevant Medications   esomeprazole (NEXIUM) 40 MG capsule   HLD (hyperlipidemia)  (Chronic)   Relevant Medications   hydrochlorothiazide (HYDRODIURIL) 25 MG tablet   atorvastatin (LIPITOR) 40 MG tablet   Hypertension - Primary (Chronic)   Relevant Medications   hydrochlorothiazide (HYDRODIURIL) 25 MG tablet   atorvastatin (LIPITOR) 40 MG tablet   Viral URI with cough   Relevant Medications   guaiFENesin-codeine 100-10 MG/5ML syrup      No orders of the defined types were placed in this encounter.    Follow-up: No Follow-up on file.   Dessa PhiJosalyn Liana Camerer MD

## 2015-04-05 NOTE — Patient Instructions (Addendum)
Zuriel was seen today for hyperlipidemia and hypertension.  Diagnoses and all orders for this visit:  Essential hypertension -     hydrochlorothiazide (HYDRODIURIL) 25 MG tablet; Take 1 tablet (25 mg total) by mouth daily.  HLD (hyperlipidemia) -     atorvastatin (LIPITOR) 40 MG tablet; Take 1 tablet (40 mg total) by mouth daily.  Gastroesophageal reflux disease, esophagitis presence not specified -     esomeprazole (NEXIUM) 40 MG capsule; Take 1 capsule (40 mg total) by mouth daily.  Viral URI with cough -     guaiFENesin-codeine 100-10 MG/5ML syrup; Take 10 mLs by mouth every 6 (six) hours as needed for cough.  You have a viral URI with cough. For this please do the following:  1. Drink plenty of fluids. Hot tea, soup etc will help open your nasal passages. 2. Dextromethorphan 30 mg every 6-8 hrs (plain Robitussin) for cough suppression. You can take the Robitussin-DM (with decongestant for 3-5 days)  3. Tylenol for pain up to 1000 mg three times daily.  4. Nasal saline-OTC nose spray for congestion.   Call  for chest pain, shortness of breath, persistent high fever.  F/u in 6 weeks for pap smear  F/u in 3 months for HTN, sooner if needed

## 2015-05-26 MED FILL — HYDROCHLOROTHIAZIDE 25 MG T: 25 | 30 days supply | Qty: 30 | Fill #8

## 2015-05-26 MED FILL — ?ATORVASTATIN 40MG TABLET: 40 | 30 days supply | Qty: 30 | Fill #1

## 2015-05-31 MED FILL — ?ESOMEPRAZOLE MAG DR 40 MG: 40 MG | 30 days supply | Qty: 30 | Fill #0

## 2015-06-22 ENCOUNTER — Emergency Department (INDEPENDENT_AMBULATORY_CARE_PROVIDER_SITE_OTHER)
Admission: EM | Admit: 2015-06-22 | Discharge: 2015-06-22 | Disposition: A | Discharge status: Home / Self Care | Payer: Self-pay | Source: Home / Self Care | Attending: Family Medicine | Admitting: Family Medicine

## 2015-06-22 ENCOUNTER — Encounter (HOSPITAL_COMMUNITY): Payer: Self-pay | Admitting: Emergency Medicine

## 2015-06-22 DIAGNOSIS — M545 Low back pain: Secondary | ICD-10-CM

## 2015-06-22 MED ORDER — CYCLOBENZAPRINE HCL 10 MG PO TABS: 10.0000 mg | ORAL_TABLET | Freq: Two times a day (BID) | ORAL | Status: DC | PRN

## 2015-06-22 MED ORDER — NAPROXEN 500 MG PO TABS
500.0000 mg | ORAL_TABLET | Freq: Two times a day (BID) | ORAL | Status: DC
Start: 2015-06-22 — End: 2016-05-23

## 2015-06-22 MED ORDER — NAPROXEN 500 MG PO TABS: 500.0000 mg | ORAL_TABLET | Freq: Two times a day (BID) | ORAL | Status: DC

## 2015-06-22 MED FILL — NAPROXEN 500 MG TABLET: 500 | 15 days supply | Qty: 30 | Fill #0

## 2015-06-22 NOTE — Discharge Instructions (Signed)
Back Exercises The following exercises strengthen the muscles that help to support the back. They also help to keep the lower back flexible. Doing these exercises can help to prevent back pain or lessen existing pain. If you have back pain or discomfort, try doing these exercises 2-3 times each day or as told by your health care provider. When the pain goes away, do them once each day, but increase the number of times that you repeat the steps for each exercise (do more repetitions). If you do not have back pain or discomfort, do these exercises once each day or as told by your health care provider. EXERCISES Single Knee to Chest Repeat these steps 3-5 times for each leg:  Lie on your back on a firm bed or the floor with your legs extended.  Bring one knee to your chest. Your other leg should stay extended and in contact with the floor.  Hold your knee in place by grabbing your knee or thigh.  Pull on your knee until you feel a gentle stretch in your lower back.  Hold the stretch for 10-30 seconds.  Slowly release and straighten your leg. Pelvic Tilt Repeat these steps 5-10 times:  Lie on your back on a firm bed or the floor with your legs extended.  Bend your knees so they are pointing toward the ceiling and your feet are flat on the floor.  Tighten your lower abdominal muscles to press your lower back against the floor. This motion will tilt your pelvis so your tailbone points up toward the ceiling instead of pointing to your feet or the floor.  With gentle tension and even breathing, hold this position for 5-10 seconds. Cat-Cow Repeat these steps until your lower back becomes more flexible:  Get into a hands-and-knees position on a firm surface. Keep your hands under your shoulders, and keep your knees under your hips. You may place padding under your knees for comfort.  Let your head hang down, and point your tailbone toward the floor so your lower back becomes rounded like the  back of a cat.  Hold this position for 5 seconds.  Slowly lift your head and point your tailbone up toward the ceiling so your back forms a sagging arch like the back of a cow.  Hold this position for 5 seconds. Press-Ups Repeat these steps 5-10 times:  Lie on your abdomen (face-down) on the floor.  Place your palms near your head, about shoulder-width apart.  While you keep your back as relaxed as possible and keep your hips on the floor, slowly straighten your arms to raise the top half of your body and lift your shoulders. Do not use your back muscles to raise your upper torso. You may adjust the placement of your hands to make yourself more comfortable.  Hold this position for 5 seconds while you keep your back relaxed.  Slowly return to lying flat on the floor. Bridges Repeat these steps 10 times:  Lie on your back on a firm surface.  Bend your knees so they are pointing toward the ceiling and your feet are flat on the floor.  Tighten your buttocks muscles and lift your buttocks off of the floor until your waist is at almost the same height as your knees. You should feel the muscles working in your buttocks and the back of your thighs. If you do not feel these muscles, slide your feet 1-2 inches farther away from your buttocks.  Hold this position for 3-5  seconds.  Slowly lower your hips to the starting position, and allow your buttocks muscles to relax completely. If this exercise is too easy, try doing it with your arms crossed over your chest. Abdominal Crunches Repeat these steps 5-10 times:  Lie on your back on a firm bed or the floor with your legs extended.  Bend your knees so they are pointing toward the ceiling and your feet are flat on the floor.  Cross your arms over your chest.  Tip your chin slightly toward your chest without bending your neck.  Tighten your abdominal muscles and slowly raise your trunk (torso) high enough to lift your shoulder blades a  tiny bit off of the floor. Avoid raising your torso higher than that, because it can put too much stress on your low back and it does not help to strengthen your abdominal muscles.  Slowly return to your starting position. Back Lifts Repeat these steps 5-10 times: 1. Lie on your abdomen (face-down) with your arms at your sides, and rest your forehead on the floor. 2. Tighten the muscles in your legs and your buttocks. 3. Slowly lift your chest off of the floor while you keep your hips pressed to the floor. Keep the back of your head in line with the curve in your back. Your eyes should be looking at the floor. 4. Hold this position for 3-5 seconds. 5. Slowly return to your starting position. SEEK MEDICAL CARE IF:  Your back pain or discomfort gets much worse when you do an exercise.  Your back pain or discomfort does not lessen within 2 hours after you exercise. If you have any of these problems, stop doing these exercises right away. Do not do them again unless your health care provider says that you can. SEEK IMMEDIATE MEDICAL CARE IF:  You develop sudden, severe back pain. If this happens, stop doing the exercises right away. Do not do them again unless your health care provider says that you can.   This information is not intended to replace advice given to you by your health care provider. Make sure you discuss any questions you have with your health care provider.   Document Released: 06/08/2004 Document Revised: 01/20/2015 Document Reviewed: 06/25/2014 Elsevier Interactive Patient Education 2016 [employer] Back Injury Prevention Back injuries can be very painful. They can also be difficult to heal. After having one back injury, you are more likely to injure your back again. It is important to learn how to avoid injuring or re-injuring your back. The following tips can help you to prevent a back injury. WHAT SHOULD I KNOW ABOUT PHYSICAL FITNESS?  Exercise for 30 minutes per day  on most days of the week or as directed by your health care provider. Make sure to:  Do aerobic exercises, such as walking, jogging, biking, or swimming.  Do exercises that increase balance and strength, such as tai chi and yoga. These can decrease your risk of falling and injuring your back.  Do stretching exercises to help with flexibility.  Try to develop strong abdominal muscles. Your abdominal muscles provide a lot of the support that is needed by your back.  Maintain a healthy weight. This helps to decrease your risk of a back injury. WHAT SHOULD I KNOW ABOUT MY DIET?  Talk with your health care provider about your overall diet. Take supplements and vitamins only as directed by your health care provider.  Talk with your health care provider about how much calcium and vitamin   D you need each day. These nutrients help to prevent weakening of the bones (osteoporosis). Osteoporosis can cause broken (fractured) bones, which lead to back pain.  Include good sources of calcium in your diet, such as dairy products, green leafy vegetables, and products that have had calcium added to them (fortified).  Include good sources of vitamin D in your diet, such as milk and foods that are fortified with vitamin D. WHAT SHOULD I KNOW ABOUT MY POSTURE?  Sit up straight and stand up straight. Avoid leaning forward when you sit or hunching over when you stand.  Choose chairs that have good low-back (lumbar) support.  If you work at a desk, sit close to it so you do not need to lean over. Keep your chin tucked in. Keep your neck drawn back, and keep your elbows bent at a right angle. Your arms should look like the letter "L."  Sit high and close to the steering wheel when you drive. Add a lumbar support to your car seat, if needed.  Avoid sitting or standing in one position for very long. Take breaks to get up, stretch, and walk around at least one time every hour. Take breaks every hour if you are  driving for long periods of time.  Sleep on your side with your knees slightly bent, or sleep on your back with a pillow under your knees. Do not lie on the front of your body to sleep. WHAT SHOULD I KNOW ABOUT LIFTING, TWISTING, AND REACHING? Lifting and Heavy Lifting  Avoid heavy lifting, especially repetitive heavy lifting. If you must do heavy lifting:  Stretch before lifting.  Work slowly.  Rest between lifts.  Use a tool such as a cart or a dolly to move objects if one is available.  Make several small trips instead of carrying one heavy load.  Ask for help when you need it, especially when moving big objects.  Follow these steps when lifting:  Stand with your feet shoulder-width apart.  Get as close to the object as you can. Do not try to pick up a heavy object that is far from your body.  Use handles or lifting straps if they are available.  Bend at your knees. Squat down, but keep your heels off the floor.  Keep your shoulders pulled back, your chin tucked in, and your back straight.  Lift the object slowly while you tighten the muscles in your legs, abdomen, and buttocks. Keep the object as close to the center of your body as possible.  Follow these steps when putting down a heavy load:  Stand with your feet shoulder-width apart.  Lower the object slowly while you tighten the muscles in your legs, abdomen, and buttocks. Keep the object as close to the center of your body as possible.  Keep your shoulders pulled back, your chin tucked in, and your back straight.  Bend at your knees. Squat down, but keep your heels off the floor.  Use handles or lifting straps if they are available. Twisting and Reaching  Avoid lifting heavy objects above your waist.  Do not twist at your waist while you are lifting or carrying a load. If you need to turn, move your feet.  Do not bend over without bending at your knees.  Avoid reaching over your head, across a table, or  for an object on a high surface. WHAT ARE SOME OTHER TIPS?  Avoid wet floors and icy ground. Keep sidewalks clear of ice to prevent falls.    falls.  Do not sleep on a mattress that is too soft or too hard.  Keep items that are used frequently within easy reach.  Put heavier objects on shelves at waist level, and put lighter objects on lower or higher shelves.  Find ways to decrease your stress, such as exercise, massage, or relaxation techniques. Stress can build up in your muscles. Tense muscles are more vulnerable to injury.  Talk with your health care provider if you feel anxious or depressed. These conditions can make back pain worse.  Wear flat heel shoes with cushioned soles.  Avoid sudden movements.  Use both shoulder straps when carrying a backpack.  Do not use any tobacco products, including cigarettes, chewing tobacco, or electronic cigarettes. If you need help quitting, ask your health care provider.   This information is not intended to replace advice given to you by your health care provider. Make sure you discuss any questions you have with your health care provider.   Document Released: 06/08/2004 Document Revised: 09/15/2014 Document Reviewed: 05/05/2014 Elsevier Interactive Patient Education [employer].

## 2015-06-22 NOTE — ED Notes (Signed)
C/o back pain radiating down to left leg onset x7 days  Reports she was cleaning baseboards at at work and since than felt pain Pain increases w/activity... Denies numbness/weakness of all extremities Taking advil w/temp relief A&O x4... No acute distress.

## 2015-06-22 NOTE — ED Provider Notes (Signed)
CSN: [id number]     Arrival date & time 06/22/15  1303 History   First MD Initiated Contact with Patient 06/22/15 1339     Chief Complaint  Patient presents with  . Back Pain   (Consider location/radiation/quality/duration/timing/severity/associated sxs/prior Treatment) HPI History obtained from patient:   LOCATION:left lumbar SEVERITY:4 DURATION:since last wednesday CONTEXT:works as a cleaner, was on the floor cleaning baseboards, developed pain in the left low back QUALITY:aching MODIFYING FACTORS:OTC meds without relief ASSOCIATED SYMPTOMS:at times pain radiates down left thigh TIMING:constant OCCUPATION:cleaner  Past Medical History  Diagnosis Date  . Hypertension Dx 2015  . GERD (gastroesophageal reflux disease)   . Arthritis    Past Surgical History  Procedure Laterality Date  . No past surgeries    . Knee arthroscopy Right 01/26/2015    Procedure: ARTHROSCOPY KNEE, DEBRIDEMENT;  Surgeon: Cammy Copa, MD;  Location: [facility] OR;  Service: Orthopedics;  Laterality: Right;   Family History  Problem Relation Age of Onset  . Hypertension Mother   . Diabetes Mother   . Hypertension Brother    Social History  Substance Use Topics  . Smoking status: Current Every Day Smoker -- 0.50 packs/day for 20 years    Types: Cigarettes  . Smokeless tobacco: None  . Alcohol Use: No     Comment: socially- ocassional   OB History    No data available     Review of Systems ROS +'ve back pain  Denies: HEADACHE, NAUSEA, ABDOMINAL PAIN, CHEST PAIN, CONGESTION, DYSURIA, SHORTNESS OF BREATH  Allergies  Levaquin  Home Medications   Prior to Admission medications   Medication Sig Start Date End Date Taking? Authorizing Provider  atorvastatin (LIPITOR) 40 MG tablet Take 1 tablet (40 mg total) by mouth daily. 04/05/15  Yes Josalyn Funches, MD  esomeprazole (NEXIUM) 40 MG capsule Take 1 capsule (40 mg total) by mouth daily. 04/05/15  Yes Josalyn Funches, MD  hydrochlorothiazide  (HYDRODIURIL) 25 MG tablet Take 1 tablet (25 mg total) by mouth daily. 04/05/15  Yes Josalyn Funches, MD  varenicline (CHANTIX STARTING MONTH PAK) 0.5 MG X 11 & 1 MG X 42 tablet Per packet insert 11/20/14  Yes Josalyn Funches, MD  guaiFENesin-codeine 100-10 MG/5ML syrup Take 10 mLs by mouth every 6 (six) hours as needed for cough. 04/05/15   Josalyn Funches, MD  ibuprofen (ADVIL) 200 MG tablet Take 3 tablets (600 mg total) by mouth every 8 (eight) hours as needed. Patient taking differently: Take 200 mg by mouth every 8 (eight) hours as needed for headache, mild pain or moderate pain.  09/03/14   Dessa Phi, MD   Meds Ordered and Administered this Visit  Medications - No data to display  BP 145/90 mmHg  Pulse 87  Temp(Src) 98.1 F (36.7 C) (Oral)  Resp 18  SpO2 100%  LMP 06/16/2015 No data found.   Physical Exam  Constitutional: She is oriented to person, place, and time. She appears well-developed and well-nourished.  HENT:  Head: Normocephalic and atraumatic.  Eyes: Conjunctivae are normal.  Pulmonary/Chest: Effort normal and breath sounds normal.  Abdominal: Soft.  Musculoskeletal: Normal range of motion. She exhibits no tenderness.       Lumbar back: She exhibits spasm.       Back:  Neurological: She is alert and oriented to person, place, and time.  Skin: Skin is warm and dry.  Psychiatric: She has a normal mood and affect. Her behavior is normal.  Nursing note and vitals reviewed.   ED Course  Procedures (including critical care time)  Labs Review Labs Reviewed - No data to display  Imaging Review No results found.   Visual Acuity Review  Right Eye Distance:   Left Eye Distance:   Bilateral Distance:    Right Eye Near:   Left Eye Near:    Bilateral Near:         MDM   1. Low back pain without sciatica, unspecified back pain laterality    Patient is advised to continue home symptomatic treatment. Prescription for Naprosyn and Flexeril sent  pharmacy patient has indicated. Patient is advised that if there are new or worsening symptoms or attend the emergency department, or contact primary care provider. Instructions of care provided discharged home in stable condition. Return to work/school note provided.  THIS NOTE WAS GENERATED USING A VOICE RECOGNITION SOFTWARE PROGRAM. ALL REASONABLE EFFORTS  WERE MADE TO PROOFREAD THIS DOCUMENT FOR ACCURACY.     Tharon Aquas, PA 06/22/15 1556

## 2015-06-28 MED FILL — ?ESOMEPRAZOLE MAG DR 40 MG: 40 MG | 30 days supply | Qty: 30 | Fill #1

## 2015-06-28 MED FILL — ATORVASTATIN 40 MG TABLET: 40 | 30 days supply | Qty: 30 | Fill #2

## 2015-06-28 MED FILL — HYDROCHLOROTHIAZIDE 25 MG T: 25 | 30 days supply | Qty: 30 | Fill #9

## 2015-07-30 MED FILL — HYDROCHLOROTHIAZIDE 25 MG T: 25 | 30 days supply | Qty: 30 | Fill #10

## 2015-07-30 MED FILL — ATORVASTATIN 40 MG TABLET: 40 | 30 days supply | Qty: 30 | Fill #3

## 2015-08-02 MED FILL — ?ESOMEPRAZOLE MAG DR 40 MG: 40 MG | 30 days supply | Qty: 30 | Fill #2

## 2015-08-31 MED FILL — ?ESOMEPRAZOLE MAG DR 40 MG: 40 MG | 30 days supply | Qty: 30 | Fill #3

## 2015-08-31 MED FILL — ?ATORVASTATIN 40MG TABLET: 40 | 30 days supply | Qty: 30 | Fill #4

## 2015-08-31 MED FILL — ?HYDROCHLOROTHIAZIDE 25 MG: 25 MG | 30 days supply | Qty: 30 | Fill #11

## 2015-09-27 ENCOUNTER — Other Ambulatory Visit: Payer: Self-pay | Admitting: Family Medicine

## 2015-09-27 MED FILL — ?ATORVASTATIN 40MG TABLET: 40 | 30 days supply | Qty: 30 | Fill #5

## 2015-09-29 MED FILL — ?HYDROCHLOROTHIAZIDE 25 MG: 25 MG | 30 days supply | Qty: 30 | Fill #0

## 2015-10-19 ENCOUNTER — Other Ambulatory Visit: Payer: Self-pay | Admitting: *Deleted

## 2015-10-19 DIAGNOSIS — K219 Gastro-esophageal reflux disease without esophagitis: Secondary | ICD-10-CM

## 2015-10-19 MED ORDER — ESOMEPRAZOLE MAGNESIUM 40 MG PO CPDR: 40.0000 mg | DELAYED_RELEASE_CAPSULE | Freq: Every day | ORAL | Status: DC

## 2015-10-19 MED FILL — ESOMEPRAZOLE MAG DR 40 MG C: 40 | 30 days supply | Qty: 30 | Fill #0

## 2015-10-29 MED FILL — HYDROCHLOROTHIAZIDE 25 MG T: 25 | 30 days supply | Qty: 30 | Fill #1

## 2015-11-01 MED FILL — ATORVASTATIN 40 MG TABLET: 40 | 30 days supply | Qty: 30 | Fill #6

## 2015-11-05 ENCOUNTER — Other Ambulatory Visit: Payer: Self-pay | Admitting: Pharmacist

## 2015-11-05 MED ORDER — OMEPRAZOLE 20 MG PO CPDR: 20.0000 mg | DELAYED_RELEASE_CAPSULE | Freq: Every day | ORAL | Status: DC

## 2015-11-29 MED FILL — ESOMEPRAZOLE MAG DR 40 MG C: 40 | 30 days supply | Qty: 30 | Fill #1

## 2015-11-29 MED FILL — HYDROCHLOROTHIAZIDE 25 MG T: 25 | 30 days supply | Qty: 30 | Fill #2

## 2015-11-29 MED FILL — ATORVASTATIN 40 MG TABLET: 40 | 30 days supply | Qty: 30 | Fill #7

## 2015-12-29 MED FILL — ?ATORVASTATIN 40MG TABLET: 40 | 30 days supply | Qty: 30 | Fill #8

## 2015-12-29 MED FILL — HYDROCHLOROTHIAZIDE 25 MG T: 25 | 30 days supply | Qty: 30 | Fill #3

## 2015-12-29 MED FILL — ESOMEPRAZOLE MAG DR 40 MG C: 40 | 30 days supply | Qty: 30 | Fill #2

## 2016-02-01 MED FILL — ESOMEPRAZOLE MAG DR 40 MG C: 40 | 30 days supply | Qty: 30 | Fill #3

## 2016-02-01 MED FILL — ATORVASTATIN 40 MG TABLET: 40 | 30 days supply | Qty: 30 | Fill #9

## 2016-02-01 MED FILL — HYDROCHLOROTHIAZIDE 25 MG T: 25 | 30 days supply | Qty: 30 | Fill #4

## 2016-03-06 MED FILL — ESOMEPRAZOLE MAG DR 40 MG C: 40 | 30 days supply | Qty: 30 | Fill #4

## 2016-03-06 MED FILL — ATORVASTATIN 40 MG TABLET: 40 | 30 days supply | Qty: 30 | Fill #10

## 2016-03-06 MED FILL — HYDROCHLOROTHIAZIDE 25 MG T: 25 | 30 days supply | Qty: 30 | Fill #5

## 2016-04-14 ENCOUNTER — Other Ambulatory Visit: Payer: Self-pay | Admitting: Family Medicine

## 2016-04-14 DIAGNOSIS — I10 Essential (primary) hypertension: Secondary | ICD-10-CM

## 2016-04-14 DIAGNOSIS — E785 Hyperlipidemia, unspecified: Secondary | ICD-10-CM

## 2016-04-14 DIAGNOSIS — K219 Gastro-esophageal reflux disease without esophagitis: Secondary | ICD-10-CM

## 2016-04-14 MED FILL — HYDROCHLOROTHIAZIDE 25 MG T: 25 | 30 days supply | Qty: 30 | Fill #0

## 2016-04-14 MED FILL — ATORVASTATIN 40 MG TABLET: 40 | 30 days supply | Qty: 30 | Fill #0

## 2016-04-14 MED FILL — ESOMEPRAZOLE MAG DR 40 MG C: 40 | 30 days supply | Qty: 30 | Fill #0

## 2016-05-23 ENCOUNTER — Ambulatory Visit: Payer: Self-pay | Attending: Family Medicine | Admitting: Family Medicine

## 2016-05-23 ENCOUNTER — Encounter: Payer: Self-pay | Admitting: Family Medicine

## 2016-05-23 VITALS — BP 146/90 | HR 86 | Temp 98.3°F | Ht 68.0 in | Wt 242.4 lb

## 2016-05-23 DIAGNOSIS — F17210 Nicotine dependence, cigarettes, uncomplicated: Secondary | ICD-10-CM | POA: Insufficient documentation

## 2016-05-23 DIAGNOSIS — I10 Essential (primary) hypertension: Secondary | ICD-10-CM

## 2016-05-23 DIAGNOSIS — Z760 Encounter for issue of repeat prescription: Secondary | ICD-10-CM | POA: Insufficient documentation

## 2016-05-23 DIAGNOSIS — E7800 Pure hypercholesterolemia, unspecified: Secondary | ICD-10-CM

## 2016-05-23 DIAGNOSIS — Z114 Encounter for screening for human immunodeficiency virus [HIV]: Secondary | ICD-10-CM

## 2016-05-23 DIAGNOSIS — K219 Gastro-esophageal reflux disease without esophagitis: Secondary | ICD-10-CM

## 2016-05-23 DIAGNOSIS — F17200 Nicotine dependence, unspecified, uncomplicated: Secondary | ICD-10-CM

## 2016-05-23 DIAGNOSIS — J0100 Acute maxillary sinusitis, unspecified: Secondary | ICD-10-CM

## 2016-05-23 LAB — COMPLETE METABOLIC PANEL WITH GFR
ALBUMIN: 3.9 g/dL (ref 3.6–5.1)
ALK PHOS: 58 U/L (ref 33–115)
ALT: 18 U/L (ref 6–29)
AST: 15 U/L (ref 10–35)
BILIRUBIN TOTAL: 0.6 mg/dL (ref 0.2–1.2)
BUN: 16 mg/dL (ref 7–25)
CO2: 23 mmol/L (ref 20–31)
Calcium: 9.4 mg/dL (ref 8.6–10.2)
Chloride: 108 mmol/L (ref 98–110)
Creat: 0.76 mg/dL (ref 0.50–1.10)
GFR, Est African American: >89 mL/min (ref >=60)
GFR, Est Non African American: >89 mL/min (ref >=60)
GLUCOSE: 91 mg/dL (ref 65–99)
Potassium: 4 mmol/L (ref 3.5–5.3)
SODIUM: 141 mmol/L (ref 135–146)
TOTAL PROTEIN: 6.8 g/dL (ref 6.1–8.1)

## 2016-05-23 LAB — LIPID PANEL
CHOL/HDL RATIO: 5.3 ratio — AB (ref <5.0)
Cholesterol: 234 mg/dL — ABNORMAL HIGH (ref <200)
HDL: 44 mg/dL — ABNORMAL LOW (ref >50)
LDL CALC: 128 mg/dL — AB (ref <100)
Triglycerides: 312 mg/dL — ABNORMAL HIGH (ref <150)
VLDL: 62 mg/dL — ABNORMAL HIGH (ref <30)

## 2016-05-23 MED ORDER — ESOMEPRAZOLE MAGNESIUM 40 MG PO CPDR: 40.0000 mg | DELAYED_RELEASE_CAPSULE | Freq: Every day | ORAL | 11 refills | Status: DC

## 2016-05-23 MED ORDER — ATORVASTATIN CALCIUM 40 MG PO TABS: 40.0000 mg | ORAL_TABLET | Freq: Every day | ORAL | 11 refills | Status: DC

## 2016-05-23 MED ORDER — HYDROCHLOROTHIAZIDE 25 MG PO TABS: 25.0000 mg | ORAL_TABLET | Freq: Every day | ORAL | 11 refills | Status: DC

## 2016-05-23 MED ORDER — AMOXICILLIN 500 MG PO CAPS: 500.0000 mg | ORAL_CAPSULE | Freq: Three times a day (TID) | ORAL | 0 refills | Status: DC

## 2016-05-23 MED FILL — ?AMOXICILLIN 500 MG CAPSULE: 500 | 10 days supply | Qty: 30 | Fill #0

## 2016-05-23 MED FILL — HYDROCHLOROTHIAZIDE 25 MG T: 25 | 30 days supply | Qty: 30 | Fill #0

## 2016-05-23 MED FILL — ?ATORVASTATIN 40MG TABLET: 40 | 30 days supply | Qty: 30 | Fill #0

## 2016-05-23 MED FILL — ?ESOMEPRAZOLE MAG DR 40MG C: 40 | 30 days supply | Qty: 30 | Fill #0

## 2016-05-23 NOTE — Progress Notes (Signed)
Pt is here today for medication refills.  Pt needs refills on lipitor, HCTZ,nexium  Pt declined flu shot.

## 2016-05-23 NOTE — Patient Instructions (Addendum)
Kristi Cisneros was seen today for medication refill.  Diagnoses and all orders for this visit:  Current smoker  Essential hypertension -     hydrochlorothiazide (HYDRODIURIL) 25 MG tablet; Take 1 tablet (25 mg total) by mouth daily. -     COMPLETE METABOLIC PANEL WITH GFR  Gastroesophageal reflux disease, esophagitis presence not specified -     esomeprazole (NEXIUM) 40 MG capsule; Take 1 capsule (40 mg total) by mouth daily. -     Lipid Panel  Pure hypercholesterolemia -     atorvastatin (LIPITOR) 40 MG tablet; Take 1 tablet (40 mg total) by mouth daily.  Screening for HIV (human immunodeficiency virus) -     HIV antibody (with reflex)  Acute non-recurrent maxillary sinusitis -     amoxicillin (AMOXIL) 500 MG capsule; Take 1 capsule (500 mg total) by mouth 3 (three) times daily.   Smoking cessation support: smoking cessation hotline: 1-800-QUIT-NOW.  Smoking cessation classes are available through [facility]. Call [phone] or visit our website at [email or web address].  F/u in 3-4 weeks for pap smear  Dr. Armen PickupFunches

## 2016-05-23 NOTE — Progress Notes (Signed)
Subjective:  Patient ID: Kristi Cisneros, female    DOB: Apr 17, 1966  Age: 51 y.o. MRN: [id number]  CC: Medication Refill   HPI Kristi Cisneros presents for   1. HTN: out of HCTZ x 1 week. No HA, CP or SOB. Cough x 1 week.   2. Cough: x 1 week with sinus pressure. No fever or chills. Symptoms are not improving. She is smoking 1/2 PPD and working to quit. Tried chantix but reports that it made her feel weird.  3. GERD: controlled with nexium. She has been out for one week. Request refill. No stomach pains, fever or weight loss. No blood in stool.   Social History  Substance Use Topics  . Smoking status: Current Every Day Smoker    Packs/day: 0.50    Years: 20.00    Types: Cigarettes  . Smokeless tobacco: Not on file  . Alcohol use No     Comment: socially- ocassional    Outpatient Medications Prior to Visit  Medication Sig Dispense Refill  . atorvastatin (LIPITOR) 40 MG tablet TAKE 1 TABLET BY MOUTH DAILY. 30 tablet 0  . esomeprazole (NEXIUM) 40 MG capsule TAKE 1 CAPSULE BY MOUTH DAILY. 30 capsule 0  . hydrochlorothiazide (HYDRODIURIL) 25 MG tablet TAKE 1 TABLET BY MOUTH DAILY. 30 tablet 0  . ibuprofen (ADVIL) 200 MG tablet Take 3 tablets (600 mg total) by mouth every 8 (eight) hours as needed. (Patient taking differently: Take 200 mg by mouth every 8 (eight) hours as needed for headache, mild pain or moderate pain. ) 30 tablet 0  . cyclobenzaprine (FLEXERIL) 10 MG tablet Take 1 tablet (10 mg total) by mouth 2 (two) times daily as needed for muscle spasms. (Patient not taking: Reported on 05/23/2016) 20 tablet 0  . guaiFENesin-codeine 100-10 MG/5ML syrup Take 10 mLs by mouth every 6 (six) hours as needed for cough. (Patient not taking: Reported on 05/23/2016) 120 mL 0  . naproxen (NAPROSYN) 500 MG tablet Take 1 tablet (500 mg total) by mouth 2 (two) times daily. (Patient not taking: Reported on 05/23/2016) 30 tablet 0  . omeprazole (PRILOSEC) 20 MG capsule Take 1 capsule (20 mg total) by  mouth daily. (Patient not taking: Reported on 05/23/2016) 30 capsule 3  . varenicline (CHANTIX STARTING MONTH PAK) 0.5 MG X 11 & 1 MG X 42 tablet Per packet insert (Patient not taking: Reported on 05/23/2016) 53 tablet 0   No facility-administered medications prior to visit.     ROS Review of Systems  Constitutional: Negative for chills and fever.  HENT: Positive for congestion.   Eyes: Negative for visual disturbance.  Respiratory: Positive for cough. Negative for shortness of breath.   Cardiovascular: Negative for chest pain.  Gastrointestinal: Negative for abdominal pain and blood in stool.  Musculoskeletal: Negative for arthralgias and back pain.  Skin: Negative for rash.  Allergic/Immunologic: Negative for immunocompromised state.  Hematological: Negative for adenopathy. Does not bruise/bleed easily.  Psychiatric/Behavioral: Negative for dysphoric mood and suicidal ideas.    Objective:  BP (!) 146/90 (BP Location: Left Arm, Patient Position: Sitting, Cuff Size: Small)   Pulse 86   Temp 98.3 F (36.8 C) (Oral)   Ht 5\' 8"  (1.727 m)   Wt 242 lb 6.4 oz (110 kg)   LMP 05/06/2016   SpO2 100%   BMI 36.86 kg/m   BP/Weight 05/23/2016 06/22/2015 04/05/2015  Systolic BP 146 145 125  Diastolic BP 90 90 84  Wt. (Lbs) 242.4 - 240  BMI 36.86 -  36.5    Physical Exam  Constitutional: She is oriented to person, place, and time. She appears well-developed and well-nourished. No distress.  HENT:  Head: Normocephalic and atraumatic.  Right Ear: Tympanic membrane, external ear and ear canal normal.  Left Ear: Tympanic membrane, external ear and ear canal normal.  Nose: Mucosal edema present.  Mouth/Throat: Oropharynx is clear and moist.  Cardiovascular: Normal rate, regular rhythm, normal heart sounds and intact distal pulses.   Pulmonary/Chest: Effort normal and breath sounds normal.  Musculoskeletal: She exhibits no edema.  Neurological: She is alert and oriented to person, place, and  time.  Skin: Skin is warm and dry. No rash noted.  Psychiatric: She has a normal mood and affect.    Assessment & Plan:   Kristi Cisneros was seen today for medication refill.  Diagnoses and all orders for this visit:  Current smoker  Essential hypertension -     hydrochlorothiazide (HYDRODIURIL) 25 MG tablet; Take 1 tablet (25 mg total) by mouth daily. -     COMPLETE METABOLIC PANEL WITH GFR  Gastroesophageal reflux disease, esophagitis presence not specified -     esomeprazole (NEXIUM) 40 MG capsule; Take 1 capsule (40 mg total) by mouth daily.  Pure hypercholesterolemia -     atorvastatin (LIPITOR) 40 MG tablet; Take 1 tablet (40 mg total) by mouth daily. -     Lipid Panel  Screening for HIV (human immunodeficiency virus) -     HIV antibody (with reflex)  Acute non-recurrent maxillary sinusitis -     amoxicillin (AMOXIL) 500 MG capsule; Take 1 capsule (500 mg total) by mouth 3 (three) times daily.    No orders of the defined types were placed in this encounter.   Follow-up: Return in about 4 weeks (around 06/20/2016) for pap smear .   Dessa Phi MD

## 2016-05-23 NOTE — Assessment & Plan Note (Signed)
Slightly elevated off meds Restart HCTZ 25 mg daily Checking CMP

## 2016-05-24 LAB — HIV ANTIBODY (ROUTINE TESTING W REFLEX): HIV: NONREACTIVE

## 2016-05-25 ENCOUNTER — Telehealth: Payer: Self-pay

## 2016-05-25 NOTE — Telephone Encounter (Addendum)
UNSUCCESSFUL CALL ATTEMPTS: [phone]: Non-working number. Home number rings continuously/no voicemail option.   When patient calls advise per Dr. Dessa PhiJosalyn Funches, MD  Screening HIV negative Normal metabolic panel High cholesterol, be sure to take Lipitor daily

## 2016-05-26 ENCOUNTER — Telehealth: Payer: Self-pay | Admitting: Family Medicine

## 2016-05-26 NOTE — Telephone Encounter (Signed)
Pt called for lab results.  Name and DOB verified.  Pt aware of lab results.

## 2016-05-26 NOTE — Telephone Encounter (Signed)
Pt. Returned nurses call regarding results.  Please f/u

## 2016-05-26 NOTE — Telephone Encounter (Signed)
Pt. Called stating she has been calling requesting her lab results. Called triage nurse and she stated she can take give pt. Her results.  Please f/u

## 2016-05-26 NOTE — Telephone Encounter (Signed)
Pt. Returned nurse call regarding results. Please f/u with pt.  °

## 2016-06-16 MED FILL — ATORVASTATIN 40 MG TABLET: 40 | 30 days supply | Qty: 30 | Fill #1

## 2016-06-16 MED FILL — HYDROCHLOROTHIAZIDE 25 MG T: 25 | 30 days supply | Qty: 30 | Fill #1

## 2016-06-16 MED FILL — ?ESOMEPRAZOLE MAG DR 40MG C: 40 | 30 days supply | Qty: 30 | Fill #1

## 2016-07-17 MED FILL — ATORVASTATIN 40 MG TABLET: 40 | 30 days supply | Qty: 30 | Fill #2

## 2016-07-17 MED FILL — ?ESOMEPRAZOLE MAG DR 40MG C: 40 | 30 days supply | Qty: 30 | Fill #2

## 2016-07-17 MED FILL — HYDROCHLOROTHIAZIDE 25 MG T: 25 | 30 days supply | Qty: 30 | Fill #2

## 2016-08-21 MED FILL — HYDROCHLOROTHIAZIDE 25 MG T: 25 | 30 days supply | Qty: 30 | Fill #3

## 2016-08-21 MED FILL — ?ESOMEPRAZOLE MAG DR 40 MG: 40 MG | 30 days supply | Qty: 30 | Fill #3

## 2016-08-21 MED FILL — ?ATORVASTATIN 40MG TABLET: 40 | 30 days supply | Qty: 30 | Fill #3

## 2016-09-21 MED FILL — ATORVASTATIN 40 MG TABLET: 40 | 30 days supply | Qty: 30 | Fill #4

## 2016-09-21 MED FILL — HYDROCHLOROTHIAZIDE 25 MG T: 25 | 30 days supply | Qty: 30 | Fill #4

## 2016-09-21 MED FILL — ?ESOMEPRAZOLE MAG DR 40 MG: 40 MG | 30 days supply | Qty: 30 | Fill #4

## 2016-09-27 ENCOUNTER — Encounter: Payer: Self-pay | Admitting: Family Medicine

## 2016-10-23 MED FILL — HYDROCHLOROTHIAZIDE 25 MG T: 25 | 30 days supply | Qty: 30 | Fill #5

## 2016-10-23 MED FILL — ?ESOMEPRAZOLE MAG DR 40 MG: 40 MG | 30 days supply | Qty: 30 | Fill #5

## 2016-10-23 MED FILL — ATORVASTATIN 40 MG TABLET: 40 | 30 days supply | Qty: 30 | Fill #5

## 2016-11-28 MED FILL — HYDROCHLOROTHIAZIDE 25 MG T: 25 | 30 days supply | Qty: 30 | Fill #6

## 2016-11-28 MED FILL — ?ESOMEPRAZOLE MAG DR 40 MG: 40 MG | 30 days supply | Qty: 30 | Fill #6

## 2016-11-28 MED FILL — ?ATORVASTATIN 40MG TABLET: 40 | 30 days supply | Qty: 30 | Fill #6

## 2016-12-27 MED FILL — HYDROCHLOROTHIAZIDE 25 MG T: 25 | 30 days supply | Qty: 30 | Fill #7

## 2016-12-27 MED FILL — ESOMEPRAZOLE MAG DR 40 MG C: 40 | 30 days supply | Qty: 30 | Fill #7

## 2016-12-27 MED FILL — ATORVASTATIN 40 MG TABLET: 40 | 30 days supply | Qty: 30 | Fill #7

## 2017-01-23 ENCOUNTER — Encounter (HOSPITAL_COMMUNITY): Payer: Self-pay | Admitting: Emergency Medicine

## 2017-01-23 ENCOUNTER — Ambulatory Visit (HOSPITAL_COMMUNITY)
Admission: EM | Admit: 2017-01-23 | Discharge: 2017-01-23 | Disposition: A | Discharge status: Home / Self Care | Payer: Self-pay | Attending: Emergency Medicine | Admitting: Emergency Medicine

## 2017-01-23 DIAGNOSIS — K219 Gastro-esophageal reflux disease without esophagitis: Secondary | ICD-10-CM | POA: Insufficient documentation

## 2017-01-23 DIAGNOSIS — F17210 Nicotine dependence, cigarettes, uncomplicated: Secondary | ICD-10-CM | POA: Insufficient documentation

## 2017-01-23 DIAGNOSIS — I10 Essential (primary) hypertension: Secondary | ICD-10-CM | POA: Insufficient documentation

## 2017-01-23 DIAGNOSIS — Z4800 Encounter for change or removal of nonsurgical wound dressing: Secondary | ICD-10-CM | POA: Insufficient documentation

## 2017-01-23 DIAGNOSIS — L02818 Cutaneous abscess of other sites: Secondary | ICD-10-CM | POA: Insufficient documentation

## 2017-01-23 DIAGNOSIS — L02415 Cutaneous abscess of right lower limb: Secondary | ICD-10-CM

## 2017-01-23 DIAGNOSIS — Z79899 Other long term (current) drug therapy: Secondary | ICD-10-CM | POA: Insufficient documentation

## 2017-01-23 DIAGNOSIS — Z5189 Encounter for other specified aftercare: Secondary | ICD-10-CM

## 2017-01-23 MED ORDER — SULFAMETHOXAZOLE-TRIMETHOPRIM 800-160 MG PO TABS: 2.0000 | ORAL_TABLET | Freq: Two times a day (BID) | ORAL | 0 refills | Status: AC

## 2017-01-23 MED FILL — SULFAMETHOXAZOLE-TMP DS TAB: 800-160 | 5 days supply | Qty: 20 | Fill #0

## 2017-01-23 NOTE — Discharge Instructions (Signed)
We will call you if we need to change your antibiotic therapy. Finish the Bactrim. This will take several weeks to heal. Keep it clean with soap and water and covered with gauze. Follow-up with your primary care physician in several weeks. Go to the ER for the signs and symptoms we discussed

## 2017-01-23 NOTE — ED Triage Notes (Signed)
PT reports she had a boil to right hip 3 weeks ago. PT reports it drained and she took keflex for 10 days. PT  Reports now there is a "hole" there.

## 2017-01-23 NOTE — ED Provider Notes (Signed)
HPI  SUBJECTIVE:  Kristi Cisneros is a 51 y.o. female who presents for wound check. She states that she had an erythematous, tender, painful knot over her right hip 2-3 weeks ago, she took some leftover Keflex 500 mg 1 pill twice a day for 10 days and she states that the pain, swelling resolved. Finished the Keflex yesterday. States it started draining on its own and she now has a large "hole". She states it itches now. It is slightly tender to palpation and continues to drain  serosanguineous material with some pus but she states that this drainage is slowing down. She denies nausea, vomiting, fevers, bodyaches, odor from the wound. She denies any hip pain. She has also tried bacitracin. Symptoms are worse with palpation. She has a past medical history of boils, hypertension. States that she is compliant with her medications. No history of MRSA, diabetes, HIV, immunocompromise, cancer. LMP: Last week. Denies possibility pregnant. ZOX:WRUEAVW, [name], MD    Past Medical History:  Diagnosis Date  . Arthritis   . GERD (gastroesophageal reflux disease)   . Hypertension Dx 2015    Past Surgical History:  Procedure Laterality Date  . KNEE ARTHROSCOPY Right 01/26/2015   Procedure: ARTHROSCOPY KNEE, DEBRIDEMENT;  Surgeon: Cammy Copa, MD;  Location: [facility] OR;  Service: Orthopedics;  Laterality: Right;  . NO PAST SURGERIES      Family History  Problem Relation Age of Onset  . Hypertension Mother   . Diabetes Mother   . Hypertension Brother     Social History  Substance Use Topics  . Smoking status: Current Every Day Smoker    Packs/day: 0.50    Years: 20.00    Types: Cigarettes  . Smokeless tobacco: Never Used  . Alcohol use No     Comment: socially- ocassional    No current facility-administered medications for this encounter.   Current Outpatient Prescriptions:  .  atorvastatin (LIPITOR) 40 MG tablet, Take 1 tablet (40 mg total) by mouth daily., Disp: 30 tablet, Rfl: 11 .   esomeprazole (NEXIUM) 40 MG capsule, Take 1 capsule (40 mg total) by mouth daily., Disp: 30 capsule, Rfl: 11 .  hydrochlorothiazide (HYDRODIURIL) 25 MG tablet, Take 1 tablet (25 mg total) by mouth daily., Disp: 30 tablet, Rfl: 11 .  sulfamethoxazole-trimethoprim (BACTRIM DS,SEPTRA DS) 800-160 MG tablet, Take 2 tablets by mouth 2 (two) times daily., Disp: 20 tablet, Rfl: 0  Allergies  Allergen Reactions  . Levaquin [Levofloxacin In D5w] Other (See Comments)    "makes me feel Weird"     ROS  As noted in HPI.   Physical Exam  BP (!) 153/108 (BP Location: Right Arm)   Pulse 88   Temp 98.4 F (36.9 C) (Oral)   Resp 16   Ht  (1.702 m)   Wt 230 lb (104.3 kg)   LMP 01/16/2017   SpO2 100%   BMI 36.02 kg/m   Constitutional: Well developed, well nourished, no acute distress Eyes:  EOMI, conjunctiva normal bilaterally HENT: Normocephalic, atraumatic,mucus membranes moist Respiratory: Normal inspiratory effort Cardiovascular: Normal rate GI: nondistended skin: 1 x 2 cm hole upper right lateral thigh with some expressible purulent drainage. No surrounding erythema, induration. Explored wound with a sterile Q-tip, no loculations found. See picture     Musculoskeletal: no deformities Neurologic: Alert & oriented x 3, no focal neuro deficits Psychiatric: Speech and behavior appropriate   ED Course   Medications - No data to display  Orders Placed This Encounter  Procedures  . Wound or Superficial Culture    Upper right lateral thigh    Standing Status:   Standing    Number of Occurrences:   1    No results found for this or any previous visit (from the past 24 hour(s)). No results found.  ED Clinical Impression  Wound check, abscess   ED Assessment/Plan  We'll send off the purulent drainage for culture. There is nothing to incise. Appears to be a healing abscess. Since the patient partially treated herself with Keflex, will finish treatment with Bactrim DS 2  tabs twice a day for 5 days. Advised her that this will take several weeks to heal by secondary intention, also she is to do local wound care. follow-up with her primary care physician as needed.  Also advised her to keep an eye on her blood pressure. She is asymptomatic today at this visit.  Discussed labs,  MDM, plan and followup with patient. Discussed sn/sx that should prompt return to the ED. Patient agrees with plan.   Meds ordered this encounter  Medications  . sulfamethoxazole-trimethoprim (BACTRIM DS,SEPTRA DS) 800-160 MG tablet    Sig: Take 2 tablets by mouth 2 (two) times daily.    Dispense:  20 tablet    Refill:  0    *This clinic note was created using Scientist, clinical (histocompatibility and immunogenetics)Dragon dictation software. Therefore, there may be occasional mistakes despite careful proofreading.  ?   Domenick GongMortenson, Thaine Garriga, MD 01/23/17 1455

## 2017-01-23 NOTE — ED Notes (Signed)
Dry dressing applied to wound on patients right hip.

## 2017-01-27 LAB — AEROBIC CULTURE W GRAM STAIN (SUPERFICIAL SPECIMEN)

## 2017-01-27 LAB — AEROBIC CULTURE  (SUPERFICIAL SPECIMEN)

## 2017-01-31 MED FILL — HYDROCHLOROTHIAZIDE 25 MG T: 25 | 30 days supply | Qty: 30 | Fill #8

## 2017-01-31 MED FILL — ESOMEPRAZOLE MAG DR 40 MG C: 40 | 30 days supply | Qty: 30 | Fill #8

## 2017-01-31 MED FILL — ?ATORVASTATIN 40MG TABLET: 40 | 30 days supply | Qty: 30 | Fill #8

## 2017-03-05 MED FILL — ?ESOMEPRAZOLE MAG DR 40MG C: 40 | 30 days supply | Qty: 30 | Fill #9

## 2017-03-05 MED FILL — ?ATORVASTATIN 40MG TABLET: 40 | 30 days supply | Qty: 30 | Fill #9

## 2017-03-05 MED FILL — HYDROCHLOROTHIAZIDE 25 MG T: 25 | 30 days supply | Qty: 30 | Fill #9

## 2017-04-09 MED FILL — HYDROCHLOROTHIAZIDE 25 MG T: 25 | 30 days supply | Qty: 30 | Fill #10

## 2017-04-09 MED FILL — ?ATORVASTATIN 40MG TABLET: 40 | 30 days supply | Qty: 30 | Fill #10

## 2017-04-09 MED FILL — ?ESOMEPRAZOLE MAG DR 40MG C: 40 | 30 days supply | Qty: 30 | Fill #10

## 2017-05-10 MED FILL — ATORVASTATIN 40 MG TABLET: 40 | 30 days supply | Qty: 30 | Fill #11

## 2017-05-10 MED FILL — ?ESOMEPRAZOLE MAG DR 40MG C: 40 | 30 days supply | Qty: 30 | Fill #11

## 2017-05-10 MED FILL — HYDROCHLOROTHIAZIDE 25 MG T: 25 | 30 days supply | Qty: 30 | Fill #11

## 2017-06-06 ENCOUNTER — Ambulatory Visit: Payer: Self-pay

## 2017-06-13 ENCOUNTER — Ambulatory Visit: Payer: Self-pay | Attending: Internal Medicine | Admitting: Physician Assistant

## 2017-06-13 VITALS — BP 150/98 | HR 103 | Temp 98.4°F | Resp 16 | Wt 235.4 lb

## 2017-06-13 DIAGNOSIS — I10 Essential (primary) hypertension: Secondary | ICD-10-CM | POA: Insufficient documentation

## 2017-06-13 DIAGNOSIS — Z79899 Other long term (current) drug therapy: Secondary | ICD-10-CM | POA: Insufficient documentation

## 2017-06-13 DIAGNOSIS — K219 Gastro-esophageal reflux disease without esophagitis: Secondary | ICD-10-CM | POA: Insufficient documentation

## 2017-06-13 DIAGNOSIS — M1990 Unspecified osteoarthritis, unspecified site: Secondary | ICD-10-CM | POA: Insufficient documentation

## 2017-06-13 DIAGNOSIS — E7800 Pure hypercholesterolemia, unspecified: Secondary | ICD-10-CM | POA: Insufficient documentation

## 2017-06-13 DIAGNOSIS — Z881 Allergy status to other antibiotic agents status: Secondary | ICD-10-CM | POA: Insufficient documentation

## 2017-06-13 MED ORDER — LISINOPRIL-HYDROCHLOROTHIAZIDE 20-25 MG PO TABS: 1.0000 | ORAL_TABLET | Freq: Every day | ORAL | 3 refills | Status: DC

## 2017-06-13 MED ORDER — ATORVASTATIN CALCIUM 40 MG PO TABS: 40.0000 mg | ORAL_TABLET | Freq: Every day | ORAL | 11 refills | Status: DC

## 2017-06-13 MED ORDER — ESOMEPRAZOLE MAGNESIUM 40 MG PO CPDR: 40.0000 mg | DELAYED_RELEASE_CAPSULE | Freq: Every day | ORAL | 11 refills | Status: DC

## 2017-06-13 MED FILL — ?ATORVASTATIN 40MG TABLET: 40 | 30 days supply | Qty: 30 | Fill #0

## 2017-06-13 MED FILL — ?ESOMEPRAZOLE MAG DR 40MG C: 40 | 30 days supply | Qty: 30 | Fill #0

## 2017-06-13 MED FILL — LISINOPRIL-HCTZ 20-25 MG TA: 20-25 | 30 days supply | Qty: 30 | Fill #0

## 2017-06-13 NOTE — Patient Instructions (Signed)
Check blood pressure 3-4 times/week and record and bring to your next visit.     Hypertension Hypertension is another name for high blood pressure. High blood pressure forces your heart to work harder to pump blood. This can cause problems over time. There are two numbers in a blood pressure reading. There is a top number (systolic) over a bottom number (diastolic). It is best to have a blood pressure below 120/80. Healthy choices can help lower your blood pressure. You may need medicine to help lower your blood pressure if:  Your blood pressure cannot be lowered with healthy choices.  Your blood pressure is higher than 130/80.  Follow these instructions at home: Eating and drinking  If directed, follow the DASH eating plan. This diet includes: ? Filling half of your plate at each meal with fruits and vegetables. ? Filling one quarter of your plate at each meal with whole grains. Whole grains include whole wheat pasta, brown rice, and whole grain bread. ? Eating or drinking low-fat dairy products, such as skim milk or low-fat yogurt. ? Filling one quarter of your plate at each meal with low-fat (lean) proteins. Low-fat proteins include fish, skinless chicken, eggs, beans, and tofu. ? Avoiding fatty meat, cured and processed meat, or chicken with skin. ? Avoiding premade or processed food.  Eat less than 1,500 mg of salt (sodium) a day.  Limit alcohol use to no more than 1 drink a day for nonpregnant women and 2 drinks a day for men. One drink equals 12 oz of beer, 5 oz of wine, or 1 oz of hard liquor. Lifestyle  Work with your doctor to stay at a healthy weight or to lose weight. Ask your doctor what the best weight is for you.  Get at least 30 minutes of exercise that causes your heart to beat faster (aerobic exercise) most days of the week. This may include walking, swimming, or biking.  Get at least 30 minutes of exercise that strengthens your muscles (resistance exercise) at least  3 days a week. This may include lifting weights or pilates.  Do not use any products that contain nicotine or tobacco. This includes cigarettes and e-cigarettes. If you need help quitting, ask your doctor.  Check your blood pressure at home as told by your doctor.  Keep all follow-up visits as told by your doctor. This is important. Medicines  Take over-the-counter and prescription medicines only as told by your doctor. Follow directions carefully.  Do not skip doses of blood pressure medicine. The medicine does not work as well if you skip doses. Skipping doses also puts you at risk for problems.  Ask your doctor about side effects or reactions to medicines that you should watch for. Contact a doctor if:  You think you are having a reaction to the medicine you are taking.  You have headaches that keep coming back (recurring).  You feel dizzy.  You have swelling in your ankles.  You have trouble with your vision. Get help right away if:  You get a very bad headache.  You start to feel confused.  You feel weak or numb.  You feel faint.  You get very bad pain in your: ? Chest. ? Belly (abdomen).  You throw up (vomit) more than once.  You have trouble breathing. Summary  Hypertension is another name for high blood pressure.  Making healthy choices can help lower blood pressure. If your blood pressure cannot be controlled with healthy choices, you may need to  take medicine. This information is not intended to replace advice given to you by your health care provider. Make sure you discuss any questions you have with your health care provider. Document Released: 10/18/2007 Document Revised: 03/29/2016 Document Reviewed: 03/29/2016 Elsevier Interactive Patient Education  [employer].

## 2017-06-13 NOTE — Progress Notes (Signed)
Kristi GladdenCandace Cisneros, is a 52 y.o. female  UJW:[id number]  FAO:[id number]  DOB - Apr 07, 1966  Subjective:  Chief Complaint and HPI: Kristi Cisneros is a 52 y.o. female here today for medication RF.  She is compliant with BP, stomach, and cholesterol meds and has been taking all 3.  No HA/CP/SOB.  No problems or SE.  No labs in about 1 year.    ROS:   Constitutional:  No f/c, No night sweats, No unexplained weight loss. EENT:  No vision changes, No blurry vision, No hearing changes. No mouth, throat, or ear problems.  Respiratory: No cough, No SOB Cardiac: No CP, no palpitations GI:  No abd pain, No N/V/D. GU: No Urinary s/sx Musculoskeletal: No joint pain Neuro: No headache, no dizziness, no motor weakness.  Skin: No rash Endocrine:  No polydipsia. No polyuria.  Psych: Denies SI/HI  No problems updated.  ALLERGIES: Allergies  Allergen Reactions  . Levaquin [Levofloxacin In D5w] Other (See Comments)    "makes me feel Weird"    PAST MEDICAL HISTORY: Past Medical History:  Diagnosis Date  . Arthritis   . GERD (gastroesophageal reflux disease)   . Hypertension Dx 2015    MEDICATIONS AT HOME: Prior to Admission medications   Medication Sig Start Date End Date Taking? Authorizing Provider  atorvastatin (LIPITOR) 40 MG tablet Take 1 tablet (40 mg total) by mouth daily. 06/13/17   Anders SimmondsMcClung, Damarco Keysor M, PA-C  esomeprazole (NEXIUM) 40 MG capsule Take 1 capsule (40 mg total) by mouth daily. 06/13/17   Anders SimmondsMcClung, Aunna Snooks M, PA-C  lisinopril-hydrochlorothiazide (PRINZIDE,ZESTORETIC) 20-25 MG tablet Take 1 tablet by mouth daily. 06/13/17   Anders SimmondsMcClung, Dontarius Sheley M, PA-C     Objective:  EXAM:   Vitals:   06/13/17 1618  BP: (!) 150/98  Pulse: (!) 103  Resp: 16  Temp: 98.4 F (36.9 C)  TempSrc: Oral  SpO2: 99%  Weight: 235 lb 6.4 oz (106.8 kg)    General appearance : A&OX3. NAD. Non-toxic-appearing HEENT: Atraumatic and Normocephalic.  PERRLA. EOM intact.   Neck: supple, no JVD. No cervical  lymphadenopathy. No thyromegaly Chest/Lungs:  Breathing-non-labored, Good air entry bilaterally, breath sounds normal without rales, rhonchi, or wheezing  CVS: S1 S2 regular, no murmurs, gallops, rubs  Extremities: Bilateral Lower Ext shows no edema, both legs are warm to touch with = pulse throughout Neurology:  CN II-XII grossly intact, Non focal.   Psych:  TP linear. J/I WNL. Normal speech. Appropriate eye contact and affect.  Skin:  No Rash  Data Review No results found for: HGBA1C   Assessment & Plan   1. Gastroesophageal reflux disease, esophagitis presence not specified Continue- - esomeprazole (NEXIUM) 40 MG capsule; Take 1 capsule (40 mg total) by mouth daily.  Dispense: 30 capsule; Refill: 11  2. Pure hypercholesterolemia continue - atorvastatin (LIPITOR) 40 MG tablet; Take 1 tablet (40 mg total) by mouth daily.  Dispense: 30 tablet; Refill: 11 - Lipid panel  3. Hypertension, unspecified type Uncontrolled-stop HCTZ. Reviewed kidney function. start- lisinopril-hydrochlorothiazide (PRINZIDE,ZESTORETIC) 20-25 MG tablet; Take 1 tablet by mouth daily.  Dispense: 90 tablet; Refill: 3 - Comprehensive metabolic panel - CBC with Differential/Platelet  Patient have been counseled extensively about nutrition and exercise  Return in about 5 weeks (around 07/18/2017) for Dr Laural BenesJohnson; f/up on BP and repeat BMP.  The patient was given clear instructions to go to ER or return to medical center if symptoms don't improve, worsen or new problems develop. The patient verbalized understanding. The patient was told  to call to get lab results if they haven't heard anything in the next week.     Georgian Co, PA-C [facility] [place], [place] [phone]   06/13/2017, 4:29 PMPatient ID: Kristi Cisneros, female   DOB: [birth year], 52 y.o.   MRN: [id number]

## 2017-06-14 ENCOUNTER — Telehealth: Payer: Self-pay

## 2017-06-14 LAB — CBC WITH DIFFERENTIAL/PLATELET
BASOS ABS: 0 10*3/uL (ref 0.0–0.2)
Basos: 0 %
EOS (ABSOLUTE): 0.1 10*3/uL (ref 0.0–0.4)
Eos: 2 %
Hematocrit: 38.9 % (ref 34.0–46.6)
Hemoglobin: 13.2 g/dL (ref 11.1–15.9)
IMMATURE GRANS (ABS): 0 10*3/uL (ref 0.0–0.1)
Immature Granulocytes: 0 %
LYMPHS ABS: 2.5 10*3/uL (ref 0.7–3.1)
Lymphs: 34 %
MCH: 30.9 pg (ref 26.6–33.0)
MCHC: 33.9 g/dL (ref 31.5–35.7)
MCV: 91 fL (ref 79–97)
MONOS ABS: 0.7 10*3/uL (ref 0.1–0.9)
Monocytes: 10 %
NEUTROS ABS: 3.9 10*3/uL (ref 1.4–7.0)
Neutrophils: 54 %
PLATELETS: 293 10*3/uL (ref 150–379)
RBC: 4.27 x10E6/uL (ref 3.77–5.28)
RDW: 13.7 % (ref 12.3–15.4)
WBC: 7.2 10*3/uL (ref 3.4–10.8)

## 2017-06-14 LAB — COMPREHENSIVE METABOLIC PANEL
ALT: 22 IU/L (ref 0–32)
AST: 17 IU/L (ref 0–40)
Albumin/Globulin Ratio: 1.5 (ref 1.2–2.2)
Albumin: 4.4 g/dL (ref 3.5–5.5)
Alkaline Phosphatase: 85 IU/L (ref 39–117)
BUN/Creatinine Ratio: 19 (ref 9–23)
BUN: 17 mg/dL (ref 6–24)
Bilirubin Total: 0.6 mg/dL (ref 0.0–1.2)
CALCIUM: 10.1 mg/dL (ref 8.7–10.2)
CO2: 26 mmol/L (ref 20–29)
CREATININE: 0.91 mg/dL (ref 0.57–1.00)
Chloride: 100 mmol/L (ref 96–106)
GFR calc Af Amer: 86 mL/min/{1.73_m2} (ref >59)
GFR, EST NON AFRICAN AMERICAN: 74 mL/min/{1.73_m2} (ref >59)
GLOBULIN, TOTAL: 2.9 g/dL (ref 1.5–4.5)
GLUCOSE: 97 mg/dL (ref 65–99)
Potassium: 3.4 mmol/L — ABNORMAL LOW (ref 3.5–5.2)
SODIUM: 141 mmol/L (ref 134–144)
Total Protein: 7.3 g/dL (ref 6.0–8.5)

## 2017-06-14 LAB — LIPID PANEL
Chol/HDL Ratio: 4.4 ratio (ref 0.0–4.4)
Cholesterol, Total: 195 mg/dL (ref 100–199)
HDL: 44 mg/dL (ref >39)
LDL CALC: 111 mg/dL — AB (ref 0–99)
TRIGLYCERIDES: 199 mg/dL — AB (ref 0–149)
VLDL CHOLESTEROL CAL: 40 mg/dL (ref 5–40)

## 2017-06-14 NOTE — Telephone Encounter (Signed)
Contacted pt to go over lab results pt is aware and doesn't have any questions or concerns 

## 2017-06-18 ENCOUNTER — Ambulatory Visit: Payer: Self-pay

## 2017-06-25 ENCOUNTER — Ambulatory Visit: Payer: Self-pay

## 2017-07-16 MED FILL — LISINOPRIL-HCTZ 20-25 MG TA: 20-25 | 30 days supply | Qty: 30 | Fill #1

## 2017-07-17 ENCOUNTER — Ambulatory Visit: Payer: No Typology Code available for payment source | Attending: Internal Medicine | Admitting: Internal Medicine

## 2017-07-17 ENCOUNTER — Encounter: Payer: Self-pay | Admitting: Internal Medicine

## 2017-07-17 VITALS — BP 123/83 | HR 86 | Temp 98.4°F | Resp 16 | Ht 67.0 in | Wt 238.8 lb

## 2017-07-17 DIAGNOSIS — K219 Gastro-esophageal reflux disease without esophagitis: Secondary | ICD-10-CM | POA: Diagnosis not present

## 2017-07-17 DIAGNOSIS — E876 Hypokalemia: Secondary | ICD-10-CM | POA: Diagnosis not present

## 2017-07-17 DIAGNOSIS — G5603 Carpal tunnel syndrome, bilateral upper limbs: Secondary | ICD-10-CM | POA: Insufficient documentation

## 2017-07-17 DIAGNOSIS — F17210 Nicotine dependence, cigarettes, uncomplicated: Secondary | ICD-10-CM | POA: Diagnosis not present

## 2017-07-17 DIAGNOSIS — E785 Hyperlipidemia, unspecified: Secondary | ICD-10-CM | POA: Insufficient documentation

## 2017-07-17 DIAGNOSIS — E559 Vitamin D deficiency, unspecified: Secondary | ICD-10-CM | POA: Insufficient documentation

## 2017-07-17 DIAGNOSIS — I10 Essential (primary) hypertension: Secondary | ICD-10-CM | POA: Insufficient documentation

## 2017-07-17 DIAGNOSIS — Z23 Encounter for immunization: Secondary | ICD-10-CM | POA: Diagnosis not present

## 2017-07-17 DIAGNOSIS — Z79899 Other long term (current) drug therapy: Secondary | ICD-10-CM | POA: Diagnosis not present

## 2017-07-17 DIAGNOSIS — F17200 Nicotine dependence, unspecified, uncomplicated: Secondary | ICD-10-CM | POA: Diagnosis not present

## 2017-07-17 MED ORDER — TETANUS-DIPHTH-ACELL PERTUSSIS 5-2.5-18.5 LF-MCG/0.5 IM SUSP: 0.5000 mL | Freq: Once | INTRAMUSCULAR | 0 refills | Status: AC

## 2017-07-17 MED FILL — ESOMEPRAZOLE MAG DR 40 MG C: 40 | 30 days supply | Qty: 30 | Fill #1

## 2017-07-17 MED FILL — ATORVASTATIN 40 MG TABLET: 40 | 30 days supply | Qty: 30 | Fill #1

## 2017-07-17 NOTE — Progress Notes (Signed)
Pt states she is having some discomfort in her b/l hands but is mostly the left hand. Pt states her hands has been falling asleep

## 2017-07-17 NOTE — Progress Notes (Signed)
Patient ID: Kristi Cisneros, female    DOB: 12/30/1965  MRN: [id number]  CC: Hypertension   Subjective: Kristi Cisneros is a 52 y.o. female who presents for chronic ds management Her concerns today include:  Patient with history of HTN, GERD, tobacco dependence  1.  C/o hands falling asleep - numbness and tingling x yrs. symptoms wake her up at night.  Also pain in thumb and index fingers.  Symptoms have been worse in past few mths.  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  Tobacco dependence: Down to half a pack a day from 1-1/2 packs.  Trying to quit.  3.  HTN: Compliant with lisinopril HCTZ and salt restriction.  Recent blood work revealed potassium of 3.4.  No chest pains or shortness of breath.  No lower extremity edema.  4.  HL: Compliant with Lipitor.  Had mild elevation in LDL of 111 that had improved from the year  before.  Does a lot of physical labor and feels she gets an exercise this way.  Patient Active Problem List   Diagnosis Date Noted  . GERD (gastroesophageal reflux disease) 04/05/2015  . Vitamin D deficiency 11/23/2014  . Bilateral hand numbness 11/20/2014  . Subcutaneous nodule of chest wall 11/20/2014  . Left shoulder pain 11/20/2014  . Menorrhagia 09/03/2014  . Patellofemoral arthritis of right knee 08/19/2014  . Right knee pain 05/22/2014  . Right ankle pain 04/17/2014  . Plantar fasciitis, bilateral 03/27/2014  . Achilles tendonitis 03/27/2014  . HLD (hyperlipidemia) 03/06/2014  . Current smoker 03/04/2014  . Hypertension      Current Outpatient Medications on File Prior to Visit  Medication Sig Dispense Refill  . atorvastatin (LIPITOR) 40 MG tablet Take 1 tablet (40 mg total) by mouth daily. 30 tablet 11  . esomeprazole (NEXIUM) 40 MG capsule Take 1 capsule (40 mg total) by mouth daily. 30 capsule 11  . lisinopril-hydrochlorothiazide  (PRINZIDE,ZESTORETIC) 20-25 MG tablet Take 1 tablet by mouth daily. 90 tablet 3   No current facility-administered medications on file prior to visit.     Allergies  Allergen Reactions  . Levaquin [Levofloxacin In D5w] Other (See Comments)    "makes me feel Weird"    Social History   Socioeconomic History  . Marital status: Single    Spouse name: Tollie Eth   . Number of children: 1   . Years of education: 87   . Highest education level: Not on file  Social Needs  . Financial resource strain: Not on file  . Food insecurity - worry: Not on file  . Food insecurity - inability: Not on file  . Transportation needs - medical: Not on file  . Transportation needs - non-medical: Not on file  Occupational History  . 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 Smoking status: Current Every Day Smoker    Packs/day: 0.50    Years: 20.00    Pack years: 10.00    Types: Cigarettes  . Smokeless tobacco: Never Used  Substance and Sexual Activity  . Alcohol use: No    Comment: socially- ocassional  . Drug use: No  . Sexual activity: Yes    Birth control/protection: None  Other Topics Concern  . Not on file  Social History Narrative  Lives with Caryn BeeKevin (parnter of 26 years) and 50 yo son.           Family History  Problem Relation Age of Onset  . Hypertension Mother   . Diabetes Mother   . Hypertension Brother     Past Surgical History:  Procedure Laterality Date  . KNEE ARTHROSCOPY Right 01/26/2015   Procedure: ARTHROSCOPY KNEE, DEBRIDEMENT;  Surgeon: Cammy CopaScott Gregory Dean, MD;  [facility];  Service: Orthopedics;  Laterality: Right;  . NO PAST SURGERIES      ROS: Review of Systems Negative except as stated above PHYSICAL EXAM: BP 123/83   Pulse 86   Temp 98.4 F (36.9 C) (Oral)   Resp 16   Ht 5\' 7"  (1.702 m)   Wt 238 lb 12.8 oz (108.3 kg)   SpO2 99%   BMI 37.40 kg/m   Physical Exam  General  appearance - alert, well appearing, middle-aged Caucasian female and in no distress Mental status - alert, oriented to person, place, and time, normal mood, behavior, speech, dress, motor activity, and thought processes Neck - supple, no significant adenopathy Chest - clear to auscultation, no wheezes, rales or rhonchi, symmetric air entry Heart - normal rate, regular rhythm, normal S1, S2, no murmurs, rubs, clicks or gallops Musculoskeletal -Tinel's sign positive Extremities - peripheral pulses normal, no pedal edema, no clubbing or cyanosis  Results for orders placed or performed in visit on 06/13/17  Comprehensive metabolic panel  Result Value Ref Range   Glucose 97 65 - 99 mg/dL   BUN 17 6 - 24 mg/dL   Creatinine, Ser 1.300.91 0.57 - 1.00 mg/dL   GFR calc non Af Amer 74 >59 mL/min/1.73   GFR calc Af Amer 86 >59 mL/min/1.73   BUN/Creatinine Ratio 19 9 - 23   Sodium 141 134 - 144 mmol/L   Potassium 3.4 (L) 3.5 - 5.2 mmol/L   Chloride 100 96 - 106 mmol/L   CO2 26 20 - 29 mmol/L   Calcium 10.1 8.7 - 10.2 mg/dL   Total Protein 7.3 6.0 - 8.5 g/dL   Albumin 4.4 3.5 - 5.5 g/dL   Globulin, Total 2.9 1.5 - 4.5 g/dL   Albumin/Globulin Ratio 1.5 1.2 - 2.2   Bilirubin Total 0.6 0.0 - 1.2 mg/dL   Alkaline Phosphatase 85 39 - 117 IU/L   AST 17 0 - 40 IU/L   ALT 22 0 - 32 IU/L  Lipid panel  Result Value Ref Range   Cholesterol, Total 195 100 - 199 mg/dL   Triglycerides 865199 (H) 0 - 149 mg/dL   HDL 44 >78>39 mg/dL   VLDL Cholesterol Cal 40 5 - 40 mg/dL   LDL Calculated 469111 (H) 0 - 99 mg/dL   Chol/HDL Ratio 4.4 0.0 - 4.4 ratio  CBC with Differential/Platelet  Result Value Ref Range   WBC 7.2 3.4 - 10.8 x10E3/uL   RBC 4.27 3.77 - 5.28 x10E6/uL   Hemoglobin 13.2 11.1 - 15.9 g/dL   Hematocrit 62.938.9 52.834.0 - 46.6 %   MCV 91 79 - 97 fL   MCH 30.9 26.6 - 33.0 pg   MCHC 33.9 31.5 - 35.7 g/dL   RDW 41.313.7 24.412.3 - 01.015.4 %   Platelets 293 150 - 379 x10E3/uL   Neutrophils 54 Not Estab. %   Lymphs 34 Not  Estab. %   Monocytes 10 Not Estab. %   Eos 2 Not Estab. %   Basos 0 Not Estab. %   Neutrophils Absolute 3.9 1.4 -  7.0 x10E3/uL   Lymphocytes Absolute 2.5 0.7 - 3.1 x10E3/uL   Monocytes Absolute 0.7 0.1 - 0.9 x10E3/uL   EOS (ABSOLUTE) 0.1 0.0 - 0.4 x10E3/uL   Basophils Absolute 0.0 0.0 - 0.2 x10E3/uL   Immature Granulocytes 0 Not Estab. %   Immature Grans (Abs) 0.0 0.0 - 0.1 x10E3/uL     ASSESSMENT AND PLAN: 1. Essential hypertension At goal.  Continue lisinopril HCTZ.  Potassium rich foods encouraged.  Recheck potassium level.  If still low we will give potassium supplement - Potassium; Future  2. Bilateral carpal tunnel syndrome Discussed diagnosis and management.  Prescription given to get cockup wrist splints.  Patient will try the splints for several weeks and if not helpful she will let me know so that we can refer to orthopedics  3. Need for influenza vaccination - Flu Vaccine QUAD 6+ mos PF IM (Fluarix Quad PF) Also due for Tdap.  This will be given at the pharmacy  4. Tobacco dependence Commended her on cutting back.  She declines medication to help her quit.  States that she intends to do this on her own.  5. Hypokalemia   6. Hyperlipidemia, unspecified hyperlipidemia type Continue pravastatin.  Patient was given the opportunity to ask questions.  Patient verbalized understanding of the plan and was able to repeat key elements of the plan.   Orders Placed This Encounter  Procedures  . Flu Vaccine QUAD 6+ mos PF IM (Fluarix Quad PF)  . Potassium     Requested Prescriptions   Signed Prescriptions Disp Refills  . Tdap (BOOSTRIX) 5-2.5-18.5 LF-MCG/0.5 injection 0.5 mL 0    Sig: Inject 0.5 mLs into the muscle once for 1 dose.    Return in about 3 months (around 10/17/2017).  Jonah Blue, MD, FACP

## 2017-07-17 NOTE — Patient Instructions (Signed)
We do not have any more of the wrist splints.  I have given a prescription.  You can get it filled at any medical supply store. Please remember to return to the lab to have your potassium level rechecked.

## 2017-07-18 ENCOUNTER — Other Ambulatory Visit: Payer: Self-pay

## 2017-08-14 MED FILL — ESOMEPRAZOLE MAG DR 40 MG C: 40 | 30 days supply | Qty: 30 | Fill #2

## 2017-08-14 MED FILL — ATORVASTATIN 40 MG TABLET: 40 | 30 days supply | Qty: 30 | Fill #2

## 2017-08-14 MED FILL — LISINOPRIL-HCTZ 20-25 MG TA: 20-25 | 30 days supply | Qty: 30 | Fill #2

## 2017-09-14 MED FILL — ATORVASTATIN CALCIUM 40 MG: 40 | 30 days supply | Qty: 30 | Fill #3

## 2017-09-14 MED FILL — LISINOPRIL-HCTZ 20-25 MG TA: 20-25 | 30 days supply | Qty: 30 | Fill #3

## 2017-09-14 MED FILL — ESOMEPRAZOLE MAG DR 40 MG C: 40 | 30 days supply | Qty: 30 | Fill #3

## 2017-10-16 MED FILL — LISINOPRIL-HCTZ 20-25 MG TA: 20-25 | 30 days supply | Qty: 30 | Fill #4

## 2017-10-16 MED FILL — ATORVASTATIN CALCIUM 40 MG: 40 | 30 days supply | Qty: 30 | Fill #4

## 2017-10-16 MED FILL — ESOMEPRAZOLE MAG DR 40 MG C: 40 | 30 days supply | Qty: 30 | Fill #4

## 2017-10-18 ENCOUNTER — Ambulatory Visit: Payer: No Typology Code available for payment source | Admitting: Internal Medicine

## 2017-11-16 MED FILL — ESOMEPRAZOLE MAGNESIUM 40 M: 40 | 30 days supply | Qty: 30 | Fill #5

## 2017-11-16 MED FILL — ATORVASTATIN CALCIUM 40 MG: 40 | 30 days supply | Qty: 30 | Fill #5

## 2017-11-16 MED FILL — LISINOPRIL-HCTZ 20-25 MG TA: 20-25 | 30 days supply | Qty: 30 | Fill #5

## 2017-11-27 ENCOUNTER — Encounter: Payer: Self-pay | Admitting: Internal Medicine

## 2017-11-27 ENCOUNTER — Ambulatory Visit: Payer: Self-pay | Attending: Internal Medicine | Admitting: Internal Medicine

## 2017-11-27 VITALS — BP 119/80 | HR 98 | Temp 98.4°F | Resp 18 | Ht 68.0 in | Wt 227.0 lb

## 2017-11-27 DIAGNOSIS — G5603 Carpal tunnel syndrome, bilateral upper limbs: Secondary | ICD-10-CM

## 2017-11-27 DIAGNOSIS — Z881 Allergy status to other antibiotic agents status: Secondary | ICD-10-CM | POA: Insufficient documentation

## 2017-11-27 DIAGNOSIS — F17210 Nicotine dependence, cigarettes, uncomplicated: Secondary | ICD-10-CM | POA: Insufficient documentation

## 2017-11-27 DIAGNOSIS — K219 Gastro-esophageal reflux disease without esophagitis: Secondary | ICD-10-CM | POA: Insufficient documentation

## 2017-11-27 DIAGNOSIS — I10 Essential (primary) hypertension: Secondary | ICD-10-CM

## 2017-11-27 DIAGNOSIS — E876 Hypokalemia: Secondary | ICD-10-CM

## 2017-11-27 DIAGNOSIS — F17200 Nicotine dependence, unspecified, uncomplicated: Secondary | ICD-10-CM

## 2017-11-27 DIAGNOSIS — Z79899 Other long term (current) drug therapy: Secondary | ICD-10-CM | POA: Insufficient documentation

## 2017-11-27 DIAGNOSIS — Z6834 Body mass index (BMI) 34.0-34.9, adult: Secondary | ICD-10-CM

## 2017-11-27 DIAGNOSIS — Z8249 Family history of ischemic heart disease and other diseases of the circulatory system: Secondary | ICD-10-CM | POA: Insufficient documentation

## 2017-11-27 DIAGNOSIS — E669 Obesity, unspecified: Secondary | ICD-10-CM

## 2017-11-27 DIAGNOSIS — E785 Hyperlipidemia, unspecified: Secondary | ICD-10-CM | POA: Insufficient documentation

## 2017-11-27 NOTE — Progress Notes (Signed)
Patient ID: Kristi Cisneros, female    DOB: 1965-07-10  MRN: [id number]  CC: Follow-up   Subjective: Kristi Cisneros is a 52 y.o. female who presents for chronic disease management. Her concerns today include:  Patient with history of HTN, GERD, tobacco dependence  CTS: On last visit we prescribed wrist splints.  She reports that her hands still fall asleep wearing wrist splint. Wakes her up 3-5 times at nights.  Also bothers her at work.  She claims homes for living.  HTN:  Checks her BP once a wk. reports readings have been good.  She is compliant with taking her medication and salt restriction.  Due for repeat potassium check.  She has lost 11 pounds since last visit.  She reports that her husband was recently diagnosed with prediabetes so they decided to make some dietary changes together has a family.  Eating more low-carb foods.    Tob: down to less than 1/2 pk/day.  Slowly trying to wean down  Patient Active Problem List   Diagnosis Date Noted  . GERD (gastroesophageal reflux disease) 04/05/2015  . Vitamin D deficiency 11/23/2014  . Bilateral hand numbness 11/20/2014  . Subcutaneous nodule of chest wall 11/20/2014  . Left shoulder pain 11/20/2014  . Menorrhagia 09/03/2014  . Patellofemoral arthritis of right knee 08/19/2014  . Right knee pain 05/22/2014  . Right ankle pain 04/17/2014  . Plantar fasciitis, bilateral 03/27/2014  . Achilles tendonitis 03/27/2014  . HLD (hyperlipidemia) 03/06/2014  . Current smoker 03/04/2014  . Hypertension      Current Outpatient Medications on File Prior to Visit  Medication Sig Dispense Refill  . atorvastatin (LIPITOR) 40 MG tablet Take 1 tablet (40 mg total) by mouth daily. 30 tablet 11  . esomeprazole (NEXIUM) 40 MG capsule Take 1 capsule (40 mg total) by mouth daily. 30 capsule 11  . lisinopril-hydrochlorothiazide (PRINZIDE,ZESTORETIC) 20-25 MG tablet Take 1 tablet by mouth daily. 90 tablet 3   No current facility-administered  medications on file prior to visit.     Allergies  Allergen Reactions  . Levaquin [Levofloxacin In D5w] Other (See Comments)    "makes me feel Weird"    Social History   Socioeconomic History  . Marital status: Single    Spouse name: Tollie Eth   . Number of children: 1   . Years of education: 77   . Highest education level: Not on file  Occupational History  . 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 Financial resource strain: Not on file  . Food insecurity:    Worry: Not on file    Inability: Not on file  . Transportation needs:    Medical: Not on file    Non-medical: Not on file  Tobacco Use  . Smoking status: Current Every Day Smoker    Packs/day: 0.50    Years: 20.00    Pack years: 10.00    Types: Cigarettes  . Smokeless tobacco: Never Used  Substance and Sexual Activity  . Alcohol use: No    Comment: socially- ocassional  . Drug use: No  . Sexual activity: Yes    Birth control/protection: None  Lifestyle  . Physical activity:    Days per week: Not on file    Minutes per session: Not on file  . Stress: Not on file  Relationships  . Social connections:    Talks on phone: Not on file  Gets together: Not on file    Attends religious service: Not on file    Active member of club or organization: Not on file    Attends meetings of clubs or organizations: Not on file    Relationship status: Not on file  . Intimate partner violence:    Fear of current or ex partner: Not on file    Emotionally abused: Not on file    Physically abused: Not on file    Forced sexual activity: Not on file  Other Topics Concern  . Not on file  Social History Narrative   Lives with Caryn BeeKevin (partner of 30 years) and 50 yo son.        Family History  Problem Relation Age of Onset  . Hypertension Mother   . Diabetes Mother   . Hypertension Brother     Past Surgical History:  Procedure Laterality Date  .  KNEE ARTHROSCOPY Right 01/26/2015   Procedure: ARTHROSCOPY KNEE, DEBRIDEMENT;  Surgeon: Cammy CopaScott Gregory Dean, MD;  [facility];  Service: Orthopedics;  Laterality: Right;  . NO PAST SURGERIES      ROS: Review of Systems Negative except as stated above PHYSICAL EXAM: BP 119/80 (BP Location: Left Arm, Patient Position: Sitting, Cuff Size: Large)   Pulse 98   Temp 98.4 F (36.9 C) (Oral)   Resp 18   Ht 5\' 8"  (1.727 m)   Wt 227 lb (103 kg)   LMP 10/13/2017   SpO2 99%   BMI 34.52 kg/m   Wt Readings from Last 3 Encounters:  11/27/17 227 lb (103 kg)  07/17/17 238 lb 12.8 oz (108.3 kg)  06/13/17 235 lb 6.4 oz (106.8 kg)    Physical Exam  General appearance - alert, well appearing, and in no distress Mental status - alert, oriented to person, place, and time, normal mood, behavior, speech, dress, motor activity, and thought processes Neck - supple, no significant adenopathy Chest - clear to auscultation, no wheezes, rales or rhonchi, symmetric air entry Heart - normal rate, regular rhythm, normal S1, S2, no murmurs, rubs, clicks or gallops Musculoskeletal -hands: No wasting of intrinsic muscles Extremities - peripheral pulses normal, no pedal edema, no clubbing or cyanosis  ASSESSMENT AND PLAN: 1. Essential hypertension At goal.  Continue lisinopril HCTZ. - Potassium  2. Bilateral carpal tunnel syndrome Will apply for orange card/cone discount.  Once she is approved we will refer for nerve conduction studies and then on to orthopedics depending on results of the nerve study.  3. Tobacco dependence Commended her on cutting back.  Continue to encourage her towards smoking cessation.  4. Hypokalemia Repeat potassium level today.  5. Class 1 obesity without serious comorbidity with body mass index (BMI) of 34.0 to 34.9 in adult, unspecified obesity type Commended her on weight loss.  Encouraged her to continue healthy eating habits and staying active.  It is good that the whole  family is involved in the effort.  Patient was given the opportunity to ask questions.  Patient verbalized understanding of the plan and was able to repeat key elements of the plan.   No orders of the defined types were placed in this encounter.    Requested Prescriptions    No prescriptions requested or ordered in this encounter    Return in about 4 months (around 03/30/2018).  Jonah Blueeborah Johnson, MD, FACP

## 2017-11-28 LAB — POTASSIUM: POTASSIUM: 3.8 mmol/L (ref 3.5–5.2)

## 2017-12-03 ENCOUNTER — Ambulatory Visit: Payer: No Typology Code available for payment source | Admitting: Internal Medicine

## 2017-12-17 MED FILL — ATORVASTATIN CALCIUM 40 MG: 40 | 30 days supply | Qty: 30 | Fill #6

## 2017-12-17 MED FILL — LISINOPRIL-HCTZ 20-25 MG TA: 20-25 | 30 days supply | Qty: 30 | Fill #6

## 2017-12-17 MED FILL — ESOMEPRAZOLE MAGNESIUM 40 M: 40 | 30 days supply | Qty: 30 | Fill #6

## 2018-01-18 MED FILL — ESOMEPRAZOLE MAGNESIUM 40 M: 40 | 30 days supply | Qty: 30 | Fill #7

## 2018-01-18 MED FILL — ATORVASTATIN CALCIUM 40 MG: 40 | 30 days supply | Qty: 30 | Fill #7

## 2018-01-18 MED FILL — LISINOPRIL-HCTZ 20-25 MG TA: 20-25 | 30 days supply | Qty: 30 | Fill #7

## 2018-02-18 MED FILL — LISINOPRIL-HCTZ 20-25 MG TA: 20-25 | 30 days supply | Qty: 30 | Fill #8

## 2018-02-18 MED FILL — ESOMEPRAZOLE MAG DR 40 MG C: 40 | 30 days supply | Qty: 30 | Fill #8

## 2018-02-18 MED FILL — ATORVASTATIN CALCIUM 40 MG: 40 | 30 days supply | Qty: 30 | Fill #8

## 2018-03-21 MED FILL — LISINOPRIL-HCTZ 20-25 MG TA: 20-25 | 30 days supply | Qty: 30 | Fill #9

## 2018-03-21 MED FILL — ATORVASTATIN CALCIUM 40 MG: 40 | 30 days supply | Qty: 30 | Fill #9

## 2018-03-25 MED FILL — ESOMEPRAZOLE MAG DR 40 MG C: 40 | 30 days supply | Qty: 30 | Fill #9

## 2018-04-01 ENCOUNTER — Ambulatory Visit: Payer: No Typology Code available for payment source | Admitting: Internal Medicine

## 2018-04-22 MED FILL — LISINOPRIL-HCTZ 20-25 MG TA: 20-25 | 30 days supply | Qty: 30 | Fill #10

## 2018-04-22 MED FILL — ATORVASTATIN CALCIUM 40 MG: 40 | 30 days supply | Qty: 30 | Fill #10

## 2018-04-22 MED FILL — ESOMEPRAZOLE MAG DR 40 MG C: 40 | 30 days supply | Qty: 30 | Fill #10

## 2018-05-21 MED FILL — LISINOPRIL-HCTZ 20-25 MG TA: 20-25 | 30 days supply | Qty: 30 | Fill #11

## 2018-05-21 MED FILL — ESOMEPRAZOLE MAG DR 40 MG C: 40 | 30 days supply | Qty: 30 | Fill #11

## 2018-05-21 MED FILL — ATORVASTATIN CALCIUM 40 MG: 40 | 30 days supply | Qty: 30 | Fill #11

## 2018-06-18 ENCOUNTER — Telehealth: Payer: Self-pay | Admitting: Internal Medicine

## 2018-06-18 ENCOUNTER — Other Ambulatory Visit: Payer: Self-pay

## 2018-06-18 DIAGNOSIS — E7800 Pure hypercholesterolemia, unspecified: Secondary | ICD-10-CM

## 2018-06-18 DIAGNOSIS — I10 Essential (primary) hypertension: Secondary | ICD-10-CM

## 2018-06-18 DIAGNOSIS — K219 Gastro-esophageal reflux disease without esophagitis: Secondary | ICD-10-CM

## 2018-06-18 MED ORDER — ATORVASTATIN CALCIUM 40 MG PO TABS: 40.0000 mg | ORAL_TABLET | Freq: Every day | ORAL | 0 refills | Status: DC

## 2018-06-18 MED ORDER — ESOMEPRAZOLE MAGNESIUM 40 MG PO CPDR: 40.0000 mg | DELAYED_RELEASE_CAPSULE | Freq: Every day | ORAL | 0 refills | Status: DC

## 2018-06-18 MED ORDER — LISINOPRIL-HYDROCHLOROTHIAZIDE 20-25 MG PO TABS: 1.0000 | ORAL_TABLET | Freq: Every day | ORAL | 0 refills | Status: DC

## 2018-06-18 MED FILL — LISINOPRIL-HCTZ 20-25 MG TA: 20-25 | 30 days supply | Qty: 30 | Fill #0

## 2018-06-18 MED FILL — ATORVASTATIN CALCIUM 40 MG: 40 | 30 days supply | Qty: 30 | Fill #0

## 2018-06-18 MED FILL — ESOMEPRAZOLE MAG DR 40 MG C: 40 | 30 days supply | Qty: 30 | Fill #0

## 2018-06-18 NOTE — Telephone Encounter (Signed)
Sent pt a 30 day supply of all 3 rx with no refills. Pt is aware that she is only getting a 30 day supply

## 2018-06-18 NOTE — Telephone Encounter (Signed)
1) Medication(s) Requested (by name): lipitor, nexium, lisinopril  2) Pharmacy of Choice: [facility]  3) Special Requests: --Pt has appt with Laural BenesJohnson 07/22/18. Call pt cell [phone] or home ph [phone].   Approved medications will be sent to the pharmacy, we will reach out if there is an issue.  Requests made after 3pm may not be addressed until the following business day!  If a patient is unsure of the name of the medication(s) please note and ask patient to call back when they are able to provide all info, do not send to responsible party until all information is available!

## 2018-07-22 ENCOUNTER — Encounter: Payer: Self-pay | Admitting: Internal Medicine

## 2018-07-22 ENCOUNTER — Ambulatory Visit: Payer: Self-pay | Attending: Internal Medicine | Admitting: Internal Medicine

## 2018-07-22 VITALS — BP 112/78 | HR 97 | Temp 98.3°F | Resp 16 | Ht 68.0 in | Wt 228.2 lb

## 2018-07-22 DIAGNOSIS — F17200 Nicotine dependence, unspecified, uncomplicated: Secondary | ICD-10-CM

## 2018-07-22 DIAGNOSIS — E7800 Pure hypercholesterolemia, unspecified: Secondary | ICD-10-CM

## 2018-07-22 DIAGNOSIS — K219 Gastro-esophageal reflux disease without esophagitis: Secondary | ICD-10-CM

## 2018-07-22 DIAGNOSIS — I10 Essential (primary) hypertension: Secondary | ICD-10-CM

## 2018-07-22 DIAGNOSIS — E669 Obesity, unspecified: Secondary | ICD-10-CM

## 2018-07-22 MED ORDER — ATORVASTATIN CALCIUM 40 MG PO TABS: 40.0000 mg | ORAL_TABLET | Freq: Every day | ORAL | 6 refills | Status: DC

## 2018-07-22 MED ORDER — LISINOPRIL-HYDROCHLOROTHIAZIDE 20-25 MG PO TABS: 1.0000 | ORAL_TABLET | Freq: Every day | ORAL | 6 refills | Status: DC

## 2018-07-22 MED ORDER — ESOMEPRAZOLE MAGNESIUM 40 MG PO CPDR
40.0000 mg | DELAYED_RELEASE_CAPSULE | Freq: Every day | ORAL | 6 refills | Status: DC
Start: 2018-07-22 — End: 2019-03-06

## 2018-07-22 MED FILL — LISINOPRIL-HCTZ 20-25 MG TA: 20-25 | 30 days supply | Qty: 30 | Fill #0

## 2018-07-22 MED FILL — ESOMEPRAZOLE MAG DR 40 MG C: 40 | 30 days supply | Qty: 30 | Fill #0

## 2018-07-22 MED FILL — ATORVASTATIN CALCIUM 40 MG: 40 | 30 days supply | Qty: 30 | Fill #0

## 2018-07-22 NOTE — Patient Instructions (Signed)
Try to avoid skipping meals.   Follow a Healthy Eating Plan - You can do it! Limit sugary drinks.  Avoid sodas, sweet tea, sport or energy drinks, or fruit drinks.  Drink water, lo-fat milk, or diet drinks. Limit snack foods.   Cut back on candy, cake, cookies, chips, ice cream.  These are a special treat, only in small amounts. Eat plenty of vegetables.  Especially dark green, red, and orange vegetables. Aim for at least 3 servings a day. More is better! Include fruit in your daily diet.  Whole fruit is much healthier than fruit juice! Limit "white" bread, "white" pasta, "white" rice.   Choose "100% whole grain" products, brown or wild rice. Avoid fatty meats. Try "Meatless Monday" and choose eggs or beans one day a week.  When eating meat, choose lean meats like chicken, [place], and fish.  Grill, broil, or bake meats instead of frying, and eat poultry without the skin. Eat less salt.  Avoid frozen pizzas, frozen dinners and salty foods.  Use seasonings other than salt in cooking.  This can help blood pressure and keep you from swelling Beer, wine and liquor have calories.  If you can safely drink alcohol, limit to 1 drink per day for women, 2 drinks for men

## 2018-07-22 NOTE — Progress Notes (Signed)
Patient ID: [name], female    DOB: 11/23/1965  MRN: [id number]  CC: Hypertension   Subjective: Kristi Kristi is a 53 y.o. female who presents for chronic ds management Her concerns today include:  Patient with history of HTN, GERD, tobacco dependence  HTN:  Checks BP 1-2 x a wk and reports good range. No CP/SOB/LE edema Cleans houses for a living.  Very active, "I'm up and down stairs."  Obesity: My eating habits are not the best.  Skips meals then ends up overeating at the next meal. She is very active due to the type of work that she does.  HL: Reports compliance with atorvastatin.  Tob dep:  Still smoking less than 1/2 pk a day.   Reports being prescribed Chantix in the past but was too afraid to take it due to possible side effects.  GERD: Doing well on Nexium.  Requests refills. Patient Active Problem List   Diagnosis Date Noted  . GERD (gastroesophageal reflux disease) 04/05/2015  . Vitamin D deficiency 11/23/2014  . Bilateral hand numbness 11/20/2014  . Subcutaneous nodule of chest wall 11/20/2014  . Left shoulder pain 11/20/2014  . Menorrhagia 09/03/2014  . Patellofemoral arthritis of right knee 08/19/2014  . Right knee pain 05/22/2014  . Right ankle pain 04/17/2014  . Plantar fasciitis, bilateral 03/27/2014  . Achilles tendonitis 03/27/2014  . HLD (hyperlipidemia) 03/06/2014  . Current smoker 03/04/2014  . Hypertension      No current outpatient medications on file prior to visit.   No current facility-administered medications on file prior to visit.     Allergies  Allergen Reactions  . Levaquin [Levofloxacin In D5w] Other (See Comments)    "makes me feel Weird"    Social History   Socioeconomic History  . Marital status: Single    Spouse name: Kristi Kristi   . Number of children: 1   . Years of education: 9   . Highest education level: Not on file  Occupational History  . 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 Financial resource strain: Not on file  . Food insecurity:    Worry: Not on file    Inability: Not on file  . Transportation needs:    Medical: Not on file    Non-medical: Not on file  Tobacco Use  . Smoking status: Current Every Day Smoker    Packs/day: 0.50    Years: 20.00    Pack years: 10.00    Types: Cigarettes  . Smokeless tobacco: Never Used  Substance and Sexual Activity  . Alcohol use: No    Comment: socially- ocassional  . Drug use: No  . Sexual activity: Yes    Birth control/protection: None  Lifestyle  . Physical activity:    Days per week: Not on file    Minutes per session: Not on file  . Stress: Not on file  Relationships  . Social connections:    Talks on phone: Not on file    Gets together: Not on file    Attends religious service: Not on file    Active member of club or organization: Not on file    Attends meetings of clubs or organizations: Not on file    Relationship status: Not on file  . Intimate partner violence:    Fear of current or ex partner: Not on file    Emotionally abused: Not  on file    Physically abused: Not on file    Forced sexual activity: Not on file  Other Topics Concern  . Not on file  Social History Narrative   Lives with Kristi Kristi (partner of 30 years) and 30 yo son.        Family History  Problem Relation Age of Onset  . Hypertension Mother   . Diabetes Mother   . Hypertension Brother     Past Surgical History:  Procedure Laterality Date  . KNEE ARTHROSCOPY Right 01/26/2015   Procedure: ARTHROSCOPY KNEE, DEBRIDEMENT;  Surgeon: Cammy Copa, MD;  [facility];  Service: Orthopedics;  Laterality: Right;  . NO PAST SURGERIES      ROS: Review of Systems Negative except as stated above  PHYSICAL EXAM: BP 112/78   Pulse 97   Temp 98.3 F (36.8 C) (Oral)   Resp 16   Ht 5\' 8"  (1.727 m)   Wt 228 lb 3.2 oz (103.5 kg)   SpO2 99%   BMI 34.70 kg/m   Wt Readings  from Last 3 Encounters:  07/22/18 228 lb 3.2 oz (103.5 kg)  11/27/17 227 lb (103 kg)  07/17/17 238 lb 12.8 oz (108.3 kg)   Physical Exam General appearance - alert, well appearing, and in no distress Mental status - normal mood, behavior, speech, dress, motor activity, and thought processes Neck - supple, no significant adenopathy Chest - clear to auscultation, no wheezes, rales or rhonchi, symmetric air entry Heart - normal rate, regular rhythm, normal S1, S2, no murmurs, rubs, clicks or gallops Extremities - peripheral pulses normal, no pedal edema, no clubbing or cyanosis   CMP Latest Ref Rng & Units 11/27/2017 06/13/2017 05/23/2016  Glucose 65 - 99 mg/dL - 97 91  BUN 6 - 24 mg/dL - 17 16  Creatinine 6.76 - 1.00 mg/dL - 7.20 9.47  Sodium 096 - 144 mmol/L - 141 141  Potassium 3.5 - 5.2 mmol/L 3.8 3.4(L) 4.0  Chloride 96 - 106 mmol/L - 100 108  CO2 20 - 29 mmol/L - 26 23  Calcium 8.7 - 10.2 mg/dL - 28.3 9.4  Total Protein 6.0 - 8.5 g/dL - 7.3 6.8  Total Bilirubin 0.0 - 1.2 mg/dL - 0.6 0.6  Alkaline Phos 39 - 117 IU/L - 85 58  AST 0 - 40 IU/L - 17 15  ALT 0 - 32 IU/L - 22 18   Lipid Panel     Component Value Date/Time   CHOL 195 06/13/2017 1636   TRIG 199 (H) 06/13/2017 1636   HDL 44 06/13/2017 1636   CHOLHDL 4.4 06/13/2017 1636   CHOLHDL 5.3 (H) 05/23/2016 1234   VLDL 62 (H) 05/23/2016 1234   LDLCALC 111 (H) 06/13/2017 1636    CBC    Component Value Date/Time   WBC 7.2 06/13/2017 1636   WBC 5.8 01/26/2015 1340   RBC 4.27 06/13/2017 1636   RBC 4.39 01/26/2015 1340   HGB 13.2 06/13/2017 1636   HCT 38.9 06/13/2017 1636   PLT 293 06/13/2017 1636   MCV 91 06/13/2017 1636   MCH 30.9 06/13/2017 1636   MCH 31.2 01/26/2015 1340   MCHC 33.9 06/13/2017 1636   MCHC 33.8 01/26/2015 1340   RDW 13.7 06/13/2017 1636   LYMPHSABS 2.5 06/13/2017 1636   EOSABS 0.1 06/13/2017 1636   BASOSABS 0.0 06/13/2017 1636    ASSESSMENT AND PLAN:  1. Essential hypertension At goal.   Continue current medications - lisinopril-hydrochlorothiazide (PRINZIDE,ZESTORETIC) 20-25 MG tablet; Take  1 tablet by mouth daily.  Dispense: 30 tablet; Refill: 6 - CBC - Comprehensive metabolic panel  2. Pure hypercholesterolemia - atorvastatin (LIPITOR) 40 MG tablet; Take 1 tablet (40 mg total) by mouth daily.  Dispense: 30 tablet; Refill: 6 - Lipid panel  3. Gastroesophageal reflux disease, esophagitis presence not specified - esomeprazole (NEXIUM) 40 MG capsule; Take 1 capsule (40 mg total) by mouth daily.  Dispense: 30 capsule; Refill: 6  4. Tobacco dependence Advised to quit.  She is aware of health risks associated with smoking.  She would be willing to try the nicotine patches.  I informed her of 1 800 quit now so that she can get the patches for free Less than 5 minutes spent on counseling  5. Obesity (BMI 30.0-34.9) Advised her to avoid skipping meals so that she does not overeat at the next meal.  Also healthy eating habits discussed and encouraged    Patient was given the opportunity to ask questions.  Patient verbalized understanding of the plan and was able to repeat key elements of the plan.   Orders Placed This Encounter  Procedures  . CBC  . Comprehensive metabolic panel  . Lipid panel     Requested Prescriptions   Signed Prescriptions Disp Refills  . atorvastatin (LIPITOR) 40 MG tablet 30 tablet 6    Sig: Take 1 tablet (40 mg total) by mouth daily.  Marland Kitchen esomeprazole (NEXIUM) 40 MG capsule 30 capsule 6    Sig: Take 1 capsule (40 mg total) by mouth daily.  Marland Kitchen lisinopril-hydrochlorothiazide (PRINZIDE,ZESTORETIC) 20-25 MG tablet 30 tablet 6    Sig: Take 1 tablet by mouth daily.    Return in about 4 months (around 11/21/2018).  Jonah Blue, MD, FACP

## 2018-07-23 LAB — LIPID PANEL
Chol/HDL Ratio: 3.6 ratio (ref 0.0–4.4)
Cholesterol, Total: 189 mg/dL (ref 100–199)
HDL: 53 mg/dL (ref >39)
LDL Calculated: 89 mg/dL (ref 0–99)
Triglycerides: 233 mg/dL — ABNORMAL HIGH (ref 0–149)
VLDL CHOLESTEROL CAL: 47 mg/dL — AB (ref 5–40)

## 2018-07-23 LAB — COMPREHENSIVE METABOLIC PANEL
ALT: 19 IU/L (ref 0–32)
AST: 14 IU/L (ref 0–40)
Albumin/Globulin Ratio: 1.8 (ref 1.2–2.2)
Albumin: 4.4 g/dL (ref 3.8–4.8)
Alkaline Phosphatase: 87 IU/L (ref 39–117)
BUN / CREAT RATIO: 18 (ref 9–23)
BUN: 14 mg/dL (ref 6–24)
Bilirubin Total: 0.6 mg/dL (ref 0.0–1.2)
CO2: 24 mmol/L (ref 20–29)
Calcium: 9.7 mg/dL (ref 8.7–10.2)
Chloride: 101 mmol/L (ref 96–106)
Creatinine, Ser: 0.77 mg/dL (ref 0.57–1.00)
GFR calc Af Amer: 104 mL/min/{1.73_m2} (ref >59)
GFR calc non Af Amer: 90 mL/min/{1.73_m2} (ref >59)
Globulin, Total: 2.4 g/dL (ref 1.5–4.5)
Glucose: 83 mg/dL (ref 65–99)
Potassium: 4.2 mmol/L (ref 3.5–5.2)
Sodium: 141 mmol/L (ref 134–144)
Total Protein: 6.8 g/dL (ref 6.0–8.5)

## 2018-07-23 LAB — CBC
HEMOGLOBIN: 12.7 g/dL (ref 11.1–15.9)
Hematocrit: 36.1 % (ref 34.0–46.6)
MCH: 31.1 pg (ref 26.6–33.0)
MCHC: 35.2 g/dL (ref 31.5–35.7)
MCV: 89 fL (ref 79–97)
PLATELETS: 300 10*3/uL (ref 150–450)
RBC: 4.08 x10E6/uL (ref 3.77–5.28)
RDW: 12.6 % (ref 11.7–15.4)
WBC: 7.6 10*3/uL (ref 3.4–10.8)

## 2018-08-29 MED FILL — ESOMEPRAZOLE MAG DR 40 MG C: 40 | 30 days supply | Qty: 30 | Fill #1

## 2018-08-29 MED FILL — LISINOPRIL-HCTZ 20-25 MG TA: 20-25 | 30 days supply | Qty: 30 | Fill #1

## 2018-08-29 MED FILL — ATORVASTATIN CALCIUM 40 MG: 40 | 30 days supply | Qty: 30 | Fill #1

## 2018-09-05 ENCOUNTER — Telehealth: Payer: Self-pay | Admitting: Internal Medicine

## 2018-09-05 NOTE — Telephone Encounter (Signed)
Patient called requesting lab results, please follow up °

## 2018-09-05 NOTE — Telephone Encounter (Signed)
Returned pt call and went over lab results pt doesn't have any questions or concerns  Deactivated pt mychart. Per pt she is not able to get in. Changed pt email and sent new activation code to email

## 2018-09-20 MED FILL — ?ESOMEPRAZOLE MAG DR 40MG C: 40 | 90 days supply | Qty: 90 | Fill #2

## 2018-09-20 MED FILL — ?ATORVASTATIN 40MG TABLET: 40 | 90 days supply | Qty: 90 | Fill #2

## 2018-09-20 MED FILL — LISINOPRIL-HCTZ 20-25 MG TA: 20-25 | 30 days supply | Qty: 30 | Fill #2

## 2018-10-31 MED FILL — LISINOPRIL-HCTZ 20-25 MG TA: 20-25 | 30 days supply | Qty: 30 | Fill #3

## 2018-11-22 ENCOUNTER — Ambulatory Visit: Payer: No Typology Code available for payment source | Admitting: Internal Medicine

## 2018-12-02 MED FILL — LISINOPRIL-HCTZ 20-25 MG TA: 20-25 | 30 days supply | Qty: 30 | Fill #4

## 2019-01-01 MED FILL — ATORVASTATIN CALCIUM 40 MG: 40 | 30 days supply | Qty: 30 | Fill #3

## 2019-01-01 MED FILL — ?ESOMEPRAZOLE MAG DR 40MG C: 40 | 30 days supply | Qty: 30 | Fill #3

## 2019-01-01 MED FILL — LISINOPRIL-HCTZ 20-25 MG TA: 20-25 | 30 days supply | Qty: 30 | Fill #5

## 2019-02-05 MED FILL — ?ATORVASTATIN 40MG TABLET: 40 | 30 days supply | Qty: 30 | Fill #4

## 2019-02-05 MED FILL — ?ESOMEPRAZOLE MAG DR 40MG C: 40 | 30 days supply | Qty: 30 | Fill #4

## 2019-02-05 MED FILL — LISINOPRIL-HCTZ 20-25 MG TA: 20-25 | 30 days supply | Qty: 30 | Fill #6

## 2019-03-06 ENCOUNTER — Other Ambulatory Visit: Payer: Self-pay | Admitting: Internal Medicine

## 2019-03-06 DIAGNOSIS — I10 Essential (primary) hypertension: Secondary | ICD-10-CM

## 2019-03-06 DIAGNOSIS — K219 Gastro-esophageal reflux disease without esophagitis: Secondary | ICD-10-CM

## 2019-03-06 DIAGNOSIS — E7800 Pure hypercholesterolemia, unspecified: Secondary | ICD-10-CM

## 2019-03-07 MED FILL — LISINOPRIL-HCTZ 20-25 MG TA: 20-25 | 30 days supply | Qty: 30 | Fill #0

## 2019-03-07 MED FILL — ?ESOMEPRAZOLE MAG DR 40MG C: 40 | 30 days supply | Qty: 30 | Fill #0

## 2019-03-07 MED FILL — ?ATORVASTATIN 40MG TABLET: 40 | 30 days supply | Qty: 30 | Fill #0

## 2019-04-03 ENCOUNTER — Ambulatory Visit: Payer: No Typology Code available for payment source | Attending: Internal Medicine | Admitting: Physician Assistant

## 2019-04-03 ENCOUNTER — Other Ambulatory Visit: Payer: Self-pay

## 2019-04-03 DIAGNOSIS — R232 Flushing: Secondary | ICD-10-CM | POA: Diagnosis not present

## 2019-04-03 DIAGNOSIS — I10 Essential (primary) hypertension: Secondary | ICD-10-CM

## 2019-04-03 DIAGNOSIS — N912 Amenorrhea, unspecified: Secondary | ICD-10-CM | POA: Diagnosis not present

## 2019-04-03 DIAGNOSIS — E7800 Pure hypercholesterolemia, unspecified: Secondary | ICD-10-CM | POA: Diagnosis not present

## 2019-04-03 DIAGNOSIS — K219 Gastro-esophageal reflux disease without esophagitis: Secondary | ICD-10-CM | POA: Diagnosis not present

## 2019-04-03 MED ORDER — ATORVASTATIN CALCIUM 40 MG PO TABS: 40.0000 mg | ORAL_TABLET | Freq: Every day | ORAL | 1 refills | Status: DC

## 2019-04-03 MED ORDER — LISINOPRIL-HYDROCHLOROTHIAZIDE 20-25 MG PO TABS: 1.0000 | ORAL_TABLET | Freq: Every day | ORAL | 1 refills | Status: DC

## 2019-04-03 MED ORDER — ESOMEPRAZOLE MAGNESIUM 40 MG PO CPDR: 40.0000 mg | DELAYED_RELEASE_CAPSULE | Freq: Every day | ORAL | 1 refills | Status: DC

## 2019-04-03 NOTE — Progress Notes (Signed)
Patient has been called and DOB has been verified. Patient has been screened and transferred to PCP to start phone visit.     

## 2019-04-03 NOTE — Progress Notes (Signed)
Virtual Visit via Telephone Note  I connected with [name] on 04/03/19 at  3:50 PM EST by telephone and verified that I am speaking with the correct person using two identifiers.   I discussed the limitations, risks, security and privacy concerns of performing an evaluation and management service by telephone and the availability of in person appointments. I also discussed with the patient that there may be a patient responsible charge related to this service. The patient expressed understanding and agreed to proceed.  Patient location:  home My Location:  [place] office Persons on the call:  Me and the patient   History of Present Illness:  Patient is calling for med RF.  Her BP is good outside of the office.  She denies HA/CP.  She is compliant with all of her medications.  She has been having hot flashes for several months.  She has not had a period since September.  Prior to that her periods were lighter but still regular.  She is married and monogamous and has taken 2 home pregnancy tests which were both negative.  She has noticed some vaginal dryness and sleep disturbances with the hot flashes.      Observations/Objective:  NAD.  A&Ox3   Assessment and Plan:   1. Hot flashes Try black cohosh.  Use replens twice weekly. - Ambulatory referral to Gynecology to see if candidate for HRT.    2. Amenorrhea Likely due to menopausal age - Ambulatory referral to Gynecology  3. Pure hypercholesterolemia - atorvastatin (LIPITOR) 40 MG tablet; Take 1 tablet (40 mg total) by mouth daily. Must have office visit for refills  Dispense: 90 tablet; Refill: 1  4. Gastroesophageal reflux disease stable - esomeprazole (NEXIUM) 40 MG capsule; Take 1 capsule (40 mg total) by mouth daily. Must have office visit for refills  Dispense: 90 capsule; Refill: 1  5. Essential hypertension Controlled based on outside readings.   - lisinopril-hydrochlorothiazide (ZESTORETIC) 20-25 MG tablet; Take 1  tablet by mouth daily.  Dispense: 90 tablet; Refill: 1    Follow Up Instructions: See PCP in 3-4 months   I discussed the assessment and treatment plan with the patient. The patient was provided an opportunity to ask questions and all were answered. The patient agreed with the plan and demonstrated an understanding of the instructions.   The patient was advised to call back or seek an in-person evaluation if the symptoms worsen or if the condition fails to improve as anticipated.  I provided 14 minutes of non-face-to-face time during this encounter.   Freeman Caldron, PA-C  Patient ID: Kristi Cisneros, female   DOB: 07-22-1965, 53 y.o.   MRN: [id number]

## 2019-04-04 MED FILL — ATORVASTATIN CALCIUM 40 MG: 40 | 30 days supply | Qty: 30 | Fill #0

## 2019-04-04 MED FILL — LISINOPRIL-HCTZ 20-25 MG TA: 20-25 | 30 days supply | Qty: 30 | Fill #0

## 2019-04-04 MED FILL — ESOMEPRAZOLE MAG DR 40 MG C: 40 | 30 days supply | Qty: 30 | Fill #0

## 2019-05-13 MED FILL — LISINOPRIL-HCTZ 20-25 MG TA: 20-25 | 30 days supply | Qty: 30 | Fill #1

## 2019-05-13 MED FILL — ?ESOMEPRAZOLE MAG DR 40MG C: 40 | 30 days supply | Qty: 30 | Fill #1

## 2019-05-13 MED FILL — ?ATORVASTATIN 40MG TABLET: 40 | 30 days supply | Qty: 30 | Fill #1

## 2019-05-19 ENCOUNTER — Encounter: Payer: No Typology Code available for payment source | Admitting: Obstetrics & Gynecology

## 2019-05-19 ENCOUNTER — Encounter: Payer: Self-pay | Admitting: Obstetrics & Gynecology

## 2019-05-22 ENCOUNTER — Encounter: Payer: Self-pay | Admitting: Obstetrics & Gynecology

## 2019-06-06 ENCOUNTER — Ambulatory Visit: Payer: No Typology Code available for payment source | Admitting: Internal Medicine

## 2019-06-09 MED FILL — ?ATORVASTATIN 40MG TABL: 40 | 30 days supply | Qty: 30 | Fill #2

## 2019-06-09 MED FILL — LISINOPRIL-HCTZ 20-25 MG TA: 20-25 | 30 days supply | Qty: 30 | Fill #2

## 2019-06-16 MED FILL — ?ESOMEPRAZOLE MAG DR 40MG C: 40 | 30 days supply | Qty: 30 | Fill #2

## 2019-06-23 ENCOUNTER — Other Ambulatory Visit: Payer: Self-pay

## 2019-06-23 ENCOUNTER — Encounter: Payer: Self-pay | Admitting: Internal Medicine

## 2019-06-23 ENCOUNTER — Ambulatory Visit (HOSPITAL_BASED_OUTPATIENT_CLINIC_OR_DEPARTMENT_OTHER): Payer: No Typology Code available for payment source | Admitting: Pharmacist

## 2019-06-23 ENCOUNTER — Ambulatory Visit: Payer: No Typology Code available for payment source | Attending: Internal Medicine | Admitting: Internal Medicine

## 2019-06-23 VITALS — BP 118/79 | HR 98 | Temp 92.3°F | Resp 16 | Wt 238.4 lb

## 2019-06-23 DIAGNOSIS — G5603 Carpal tunnel syndrome, bilateral upper limbs: Secondary | ICD-10-CM

## 2019-06-23 DIAGNOSIS — E785 Hyperlipidemia, unspecified: Secondary | ICD-10-CM

## 2019-06-23 DIAGNOSIS — I10 Essential (primary) hypertension: Secondary | ICD-10-CM

## 2019-06-23 DIAGNOSIS — Z23 Encounter for immunization: Secondary | ICD-10-CM

## 2019-06-23 DIAGNOSIS — Z1211 Encounter for screening for malignant neoplasm of colon: Secondary | ICD-10-CM

## 2019-06-23 DIAGNOSIS — R739 Hyperglycemia, unspecified: Secondary | ICD-10-CM

## 2019-06-23 DIAGNOSIS — F17200 Nicotine dependence, unspecified, uncomplicated: Secondary | ICD-10-CM

## 2019-06-23 DIAGNOSIS — Z1231 Encounter for screening mammogram for malignant neoplasm of breast: Secondary | ICD-10-CM

## 2019-06-23 DIAGNOSIS — K219 Gastro-esophageal reflux disease without esophagitis: Secondary | ICD-10-CM

## 2019-06-23 DIAGNOSIS — F17210 Nicotine dependence, cigarettes, uncomplicated: Secondary | ICD-10-CM | POA: Diagnosis not present

## 2019-06-23 NOTE — Patient Instructions (Addendum)
Please apply for the orange card/cone discount card.  Once approved we can refer you for the nerve conduction study on your hands and then to an orthopedic surgeon.  Continue to work on trying to cut back on the cigarettes.   Obesity, Adult Obesity is having too much body fat. Being obese means that your weight is more than what is healthy for you. BMI is a number that explains how much body fat you have. If you have a BMI of 30 or more, you are obese. Obesity is often caused by eating or drinking more calories than your body uses. Changing your lifestyle can help you lose weight. Obesity can cause serious health problems, such as:  Stroke.  Coronary artery disease (CAD).  Type 2 diabetes.  Some types of cancer, including cancers of the colon, breast, uterus, and gallbladder.  Osteoarthritis.  High blood pressure (hypertension).  High cholesterol.  Sleep apnea.  Gallbladder stones.  Infertility problems. What are the causes?  Eating meals each day that are high in calories, sugar, and fat.  Being born with genes that may make you more likely to become obese.  Having a medical condition that causes obesity.  Taking certain medicines.  Sitting a lot (having a sedentary lifestyle).  Not getting enough sleep.  Drinking a lot of drinks that have sugar in them. What increases the risk?  Having a family history of obesity.  Being an [place] American woman.  Being a Hispanic man.  Living in an area with limited access to: ? Romilda Garret, recreation centers, or sidewalks. ? Healthy food choices, such as grocery stores and farmers' markets. What are the signs or symptoms? The main sign is having too much body fat. How is this treated?  Treatment for this condition often includes changing your lifestyle. Treatment may include: ? Changing your diet. This may include making a healthy meal plan. ? Exercise. This may include activity that causes your heart to beat faster  (aerobic exercise) and strength training. Work with your doctor to design a program that works for you. ? Medicine to help you lose weight. This may be used if you are not able to lose 1 pound a week after 6 weeks of healthy eating and more exercise. ? Treating conditions that cause the obesity. ? Surgery. Options may include gastric banding and gastric bypass. This may be done if:  Other treatments have not helped to improve your condition.  You have a BMI of 40 or higher.  You have life-threatening health problems related to obesity. Follow these instructions at home: Eating and drinking   Follow advice from your doctor about what to eat and drink. Your doctor may tell you to: ? Limit fast food, sweets, and processed snack foods. ? Choose low-fat options. For example, choose low-fat milk instead of whole milk. ? Eat 5 or more servings of fruits or vegetables each day. ? Eat at home more often. This gives you more control over what you eat. ? Choose healthy foods when you eat out. ? Learn to read food labels. This will help you learn how much food is in 1 serving. ? Keep low-fat snacks available. ? Avoid drinks that have a lot of sugar in them. These include soda, fruit juice, iced tea with sugar, and flavored milk.  Drink enough water to keep your pee (urine) pale yellow.  Do not go on fad diets. Physical activity  Exercise often, as told by your doctor. Most adults should get up to 150  minutes of moderate-intensity exercise every week.Ask your doctor: ? What types of exercise are safe for you. ? How often you should exercise.  Warm up and stretch before being active.  Do slow stretching after being active (cool down).  Rest between times of being active. Lifestyle  Work with your doctor and a food expert (dietitian) to set a weight-loss goal that is best for you.  Limit your screen time.  Find ways to reward yourself that do not involve food.  Do not drink alcohol  if: ? Your doctor tells you not to drink. ? You are pregnant, may be pregnant, or are planning to become pregnant.  If you drink alcohol: ? Limit how much you use to:  0-1 drink a day for women.  0-2 drinks a day for men. ? Be aware of how much alcohol is in your drink. In the [place] one drink equals one 12 oz bottle of beer (355 mL), one 5 oz glass of wine (148 mL), or one 1 oz glass of hard liquor (44 mL). General instructions  Keep a weight-loss journal. This can help you keep track of: ? The food that you eat. ? How much exercise you get.  Take over-the-counter and prescription medicines only as told by your doctor.  Take vitamins and supplements only as told by your doctor.  Think about joining a support group.  Keep all follow-up visits as told by your doctor. This is important. Contact a doctor if:  You cannot meet your weight loss goal after you have changed your diet and lifestyle for 6 weeks. Get help right away if you:  Are having trouble breathing.  Are having thoughts of harming yourself. Summary  Obesity is having too much body fat.  Being obese means that your weight is more than what is healthy for you.  Work with your doctor to set a weight-loss goal.  Get regular exercise as told by your doctor. This information is not intended to replace advice given to you by your health care provider. Make sure you discuss any questions you have with your health care provider. Document Revised: 01/03/2018 Document Reviewed: 01/03/2018 Elsevier Patient Education  2020 Elsevier Inc.  Td (Tetanus, Diphtheria) Vaccine: What You Need to Know 1. Why get vaccinated? Td vaccine can prevent tetanus and diphtheria. Tetanus enters the body through cuts or wounds. Diphtheria spreads from person to person.  TETANUS (T) causes painful stiffening of the muscles. Tetanus can lead to serious health problems, including being unable to open the mouth, having trouble swallowing and  breathing, or death.  DIPHTHERIA (D) can lead to difficulty breathing, heart failure, paralysis, or death. 2. Td vaccine Td is only for children 7 years and older, adolescents, and adults.  Td is usually given as a booster dose every 10 years, but it can also be given earlier after a severe and dirty wound or burn. Another vaccine, called Tdap, that protects against pertussis, also known as "whooping cough," in addition to tetanus and diphtheria, may be used instead of Td.  Td may be given at the same time as other vaccines. 3. Talk with your health care provider Tell your vaccine provider if the person getting the vaccine:  Has had an allergic reaction after a previous dose of any vaccine that protects against tetanus or diphtheria, or has any severe, life-threatening allergies.  Has ever had Guillain-Barr Syndrome (also called GBS).  Has had severe pain or swelling after a previous dose of any vaccine that  protects against tetanus or diphtheria. In some cases, your health care provider may decide to postpone Td vaccination to a future visit.  People with minor illnesses, such as a cold, may be vaccinated. People who are moderately or severely ill should usually wait until they recover before getting Td vaccine.  Your health care provider can give you more information. 4. Risks of a vaccine reaction  Pain, redness, or swelling where the shot was given, mild fever, headache, feeling tired, and nausea, vomiting, diarrhea, or stomachache sometimes happen after Td vaccine. People sometimes faint after medical procedures, including vaccination. Tell your provider if you feel dizzy or have vision changes or ringing in the ears.  As with any medicine, there is a very remote chance of a vaccine causing a severe allergic reaction, other serious injury, or death. 5. What if there is a serious problem? An allergic reaction could occur after the vaccinated person leaves the clinic. If you see signs of  a severe allergic reaction (hives, swelling of the face and throat, difficulty breathing, a fast heartbeat, dizziness, or weakness), call 9-1-1 and get the person to the nearest hospital.  For other signs that concern you, call your health care provider.  Adverse reactions should be reported to the Vaccine Adverse Event Reporting System (VAERS). Your health care provider will usually file this report, or you can do it yourself. Visit the VAERS website at www.vaers.LAgents.no or call 418-074-6523. VAERS is only for reporting reactions, and VAERS staff do not give medical advice. 6. The National Vaccine Injury Compensation Program The [employer] Vaccine Injury Compensation Program (VICP) is a federal program that was created to compensate people who may have been injured by certain vaccines. Visit the VICP website at SpiritualWord.at or call 559-236-6797 to learn about the program and about filing a claim. There is a time limit to file a claim for compensation. 7. How can I learn more?  Ask your health care provider.  Call your local or state health department.  Contact the Centers for Disease Control and Prevention (CDC): ? Call (510) 362-8889 (1-800-CDC-INFO) or ? Visit CDC's website at PicCapture.uy Vaccine Information Statement Td Vaccine (08/14/18) This information is not intended to replace advice given to you by your health care provider. Make sure you discuss any questions you have with your health care provider. Document Revised: 09/23/2018 Document Reviewed: 08/26/2018 Elsevier Patient Education  2020 Elsevier Inc.  Influenza Virus Vaccine injection (Fluarix) What is this medicine? INFLUENZA VIRUS VACCINE (in floo EN zuh VAHY ruhs vak SEEN) helps to reduce the risk of getting influenza also known as the flu. This medicine may be used for other purposes; ask your health care provider or pharmacist if you have questions. COMMON BRAND NAME(S): Fluarix, Fluzone What  should I tell my health care provider before I take this medicine? They need to know if you have any of these conditions:  bleeding disorder like hemophilia  fever or infection  Guillain-Barre syndrome or other neurological problems  immune system problems  infection with the human immunodeficiency virus (HIV) or AIDS  low blood platelet counts  multiple sclerosis  an unusual or allergic reaction to influenza virus vaccine, eggs, chicken proteins, latex, gentamicin, other medicines, foods, dyes or preservatives  pregnant or trying to get pregnant  breast-feeding How should I use this medicine? This vaccine is for injection into a muscle. It is given by a health care professional. A copy of Vaccine Information Statements will be given before each vaccination. Read this sheet carefully  each time. The sheet may change frequently. Talk to your pediatrician regarding the use of this medicine in children. Special care may be needed. Overdosage: If you think you have taken too much of this medicine contact a poison control center or emergency room at once. NOTE: This medicine is only for you. Do not share this medicine with others. What if I miss a dose? This does not apply. What may interact with this medicine?  chemotherapy or radiation therapy  medicines that lower your immune system like etanercept, anakinra, infliximab, and adalimumab  medicines that treat or prevent blood clots like warfarin  phenytoin  steroid medicines like prednisone or cortisone  theophylline  vaccines This list may not describe all possible interactions. Give your health care provider a list of all the medicines, herbs, non-prescription drugs, or dietary supplements you use. Also tell them if you smoke, drink alcohol, or use illegal drugs. Some items may interact with your medicine. What should I watch for while using this medicine? Report any side effects that do not go away within 3 days to your  doctor or health care professional. Call your health care provider if any unusual symptoms occur within 6 weeks of receiving this vaccine. You may still catch the flu, but the illness is not usually as bad. You cannot get the flu from the vaccine. The vaccine will not protect against colds or other illnesses that may cause fever. The vaccine is needed every year. What side effects may I notice from receiving this medicine? Side effects that you should report to your doctor or health care professional as soon as possible:  allergic reactions like skin rash, itching or hives, swelling of the face, lips, or tongue Side effects that usually do not require medical attention (report to your doctor or health care professional if they continue or are bothersome):  fever  headache  muscle aches and pains  pain, tenderness, redness, or swelling at site where injected  weak or tired This list may not describe all possible side effects. Call your doctor for medical advice about side effects. You may report side effects to FDA at 1-800-FDA-1088. Where should I keep my medicine? This vaccine is only given in a clinic, pharmacy, doctor's office, or other health care setting and will not be stored at home. NOTE: This sheet is a summary. It may not cover all possible information. If you have questions about this medicine, talk to your doctor, pharmacist, or health care provider.  2020 Elsevier/Gold Standard (2007-11-27 09:30:40)

## 2019-06-23 NOTE — Progress Notes (Addendum)
Patient ID: Kristi Cisneros, female    DOB: 05-06-1966  MRN: [id number]  CC: Hypertension and Hand Pain   Subjective: Kristi Cisneros is a 54 y.o. female who presents for chronic ds management Her concerns today include:  Patient with history of HTN, HL, GERD, tobacco dependence  HYPERTENSION Currently taking: see medication list.  She is on lisinopril/HCTZ Med Adherence: [x]  Yes    []  No Medication side effects: []  Yes    []  No Adherence with salt restriction: [x]  Yes    []  No Home Monitoring?: [x]  Yes    []  No Monitoring Frequency: Occasionally Home BP results range: its been good SOB? []  Yes    [x]  No Chest Pain?: []  Yes    [x]  No Leg swelling?: []  Yes    [x]  No Headaches?: []  Yes    [x]  No Dizziness? []  Yes    [x]  No Comments:   HL: Reports compliance with Lipitor.  Denies any muscle aches or cramps from the medication  GERD:  Takes Nexium every day.  If she skips for several days, heartburn returns.  She avoids food that makes heartburn worse  Tob dep:  Previously was over 1 pk a day.  Now 1/2-1/3 pk a day. Trying to quit slowly.    Obesity: Very active with the type of work that she does.  She cleans houses for living.  She tells me she walks up and down steps all day.  Admits that she has been overeating and snacking more than she should since the Covid pandemic.  She has gained 10 pounds since I last saw her a year ago.  CTS:  Still waking up 2-3 times a night with numbness in the hands despite using the cock-up wrist splints.  She feels that the splints help but symptoms are progressing.  She gets numbness in the hands when she is working through the day Patient Active Problem List   Diagnosis Date Noted  . GERD (gastroesophageal reflux disease) 04/05/2015  . Vitamin D deficiency 11/23/2014  . Bilateral hand numbness 11/20/2014  . Subcutaneous nodule of chest wall 11/20/2014  . Left shoulder pain 11/20/2014  . Menorrhagia 09/03/2014  . Patellofemoral arthritis of  right knee 08/19/2014  . Right knee pain 05/22/2014  . Right ankle pain 04/17/2014  . Plantar fasciitis, bilateral 03/27/2014  . Achilles tendonitis 03/27/2014  . HLD (hyperlipidemia) 03/06/2014  . Current smoker 03/04/2014  . Hypertension      Current Outpatient Medications on File Prior to Visit  Medication Sig Dispense Refill  . atorvastatin (LIPITOR) 40 MG tablet Take 1 tablet (40 mg total) by mouth daily. Must have office visit for refills 90 tablet 1  . esomeprazole (NEXIUM) 40 MG capsule Take 1 capsule (40 mg total) by mouth daily. Must have office visit for refills 90 capsule 1  . lisinopril-hydrochlorothiazide (ZESTORETIC) 20-25 MG tablet Take 1 tablet by mouth daily. 90 tablet 1   No current facility-administered medications on file prior to visit.    Allergies  Allergen Reactions  . Levaquin [Levofloxacin In D5w] Other (See Comments)    "makes me feel Weird"    Social History   Socioeconomic History  . Marital status: Single    Spouse name: Kristi Cisneros   . Number of children: 1   . Years of education: 13   . Highest education level: Not on file  Occupational History  . Occupation: Conservation officer, historic buildings: [employer]     Comment:  [employer]   Tobacco Use  . Smoking status: Current Every Day Smoker    Packs/day: 0.50    Years: 20.00    Pack years: 10.00    Types: Cigarettes  . Smokeless tobacco: Never Used  Substance and Sexual Activity  . Alcohol use: No    Comment: socially- ocassional  . Drug use: No  . Sexual activity: Yes    Birth control/protection: None  Other Topics Concern  . Not on file  Social History Narrative   Lives with Caryn Bee (partner of 30 years) and 35 yo son.       Social Determinants of Health   Financial Resource Strain:   . Difficulty of Paying Living Expenses: Not on file  Food Insecurity:   . Worried About Programme researcher, broadcasting/film/video in the Last Year: Not on file  . Ran Out of Food in the Last Year: Not on file    Transportation Needs:   . Lack of Transportation (Medical): Not on file  . Lack of Transportation (Non-Medical): Not on file  Physical Activity:   . Days of Exercise per Week: Not on file  . Minutes of Exercise per Session: Not on file  Stress:   . Feeling of Stress : Not on file  Social Connections:   . Frequency of Communication with Friends and Family: Not on file  . Frequency of Social Gatherings with Friends and Family: Not on file  . Attends Religious Services: Not on file  . Active Member of Clubs or Organizations: Not on file  . Attends Banker Meetings: Not on file  . Marital Status: Not on file  Intimate Partner Violence:   . Fear of Current or Ex-Partner: Not on file  . Emotionally Abused: Not on file  . Physically Abused: Not on file  . Sexually Abused: Not on file    Family History  Problem Relation Age of Onset  . Hypertension Mother   . Diabetes Mother   . Hypertension Brother     Past Surgical History:  Procedure Laterality Date  . KNEE ARTHROSCOPY Right 01/26/2015   Procedure: ARTHROSCOPY KNEE, DEBRIDEMENT;  Surgeon: Cammy Copa, MD;  Location: [facility] OR;  Service: Orthopedics;  Laterality: Right;  . NO PAST SURGERIES      ROS: Review of Systems Negative except as stated above  PHYSICAL EXAM: BP 118/79   Pulse 98   Temp (!) 92.3 F (33.5 C)   Resp 16   Wt 238 lb 6.4 oz (108.1 kg)   SpO2 97%   BMI 36.25 kg/m   Wt Readings from Last 3 Encounters:  06/23/19 238 lb 6.4 oz (108.1 kg)  07/22/18 228 lb 3.2 oz (103.5 kg)  11/27/17 227 lb (103 kg)    Physical Exam  General appearance - alert, well appearing, and in no distress Mental status - normal mood, behavior, speech, dress, motor activity, and thought processes Neck - supple, no significant adenopathy Chest - clear to auscultation, no wheezes, rales or rhonchi, symmetric air entry Heart - normal rate, regular rhythm, normal S1, S2, no murmurs, rubs, clicks or  gallops Musculoskeletal -grip 5/5 bilaterally.  No wasting of intrinsic hand muscles.  Tinel signs positive bilaterally  extremities - peripheral pulses normal, no pedal edema, no clubbing or cyanosis   CMP Latest Ref Rng & Units 07/22/2018 11/27/2017 06/13/2017  Glucose 65 - 99 mg/dL 83 - 97  BUN 6 - 24 mg/dL 14 - 17  Creatinine 3.14 - 1.00 mg/dL 9.70 -  0.91  Sodium 134 - 144 mmol/L 141 - 141  Potassium 3.5 - 5.2 mmol/L 4.2 3.8 3.4(L)  Chloride 96 - 106 mmol/L 101 - 100  CO2 20 - 29 mmol/L 24 - 26  Calcium 8.7 - 10.2 mg/dL 9.7 - 09.3  Total Protein 6.0 - 8.5 g/dL 6.8 - 7.3  Total Bilirubin 0.0 - 1.2 mg/dL 0.6 - 0.6  Alkaline Phos 39 - 117 IU/L 87 - 85  AST 0 - 40 IU/L 14 - 17  ALT 0 - 32 IU/L 19 - 22   Lipid Panel     Component Value Date/Time   CHOL 189 07/22/2018 1639   TRIG 233 (H) 07/22/2018 1639   HDL 53 07/22/2018 1639   CHOLHDL 3.6 07/22/2018 1639   CHOLHDL 5.3 (H) 05/23/2016 1234   VLDL 62 (H) 05/23/2016 1234   LDLCALC 89 07/22/2018 1639    CBC    Component Value Date/Time   WBC 7.6 07/22/2018 1639   WBC 5.8 01/26/2015 1340   RBC 4.08 07/22/2018 1639   RBC 4.39 01/26/2015 1340   HGB 12.7 07/22/2018 1639   HCT 36.1 07/22/2018 1639   PLT 300 07/22/2018 1639   MCV 89 07/22/2018 1639   MCH 31.1 07/22/2018 1639   MCH 31.2 01/26/2015 1340   MCHC 35.2 07/22/2018 1639   MCHC 33.8 01/26/2015 1340   RDW 12.6 07/22/2018 1639   LYMPHSABS 2.5 06/13/2017 1636   EOSABS 0.1 06/13/2017 1636   BASOSABS 0.0 06/13/2017 1636    ASSESSMENT AND PLAN: 1. Essential hypertension At goal.  Continue current medication and low-salt diet - CBC - Lipid panel  2. Hyperlipidemia, unspecified hyperlipidemia type Continue atorvastatin - Comprehensive metabolic panel  3. Bilateral carpal tunnel syndrome Patient will apply for the orange card/cone discount card.  Once approved I will refer her for nerve conduction study then to orthopedics to consider surgical option  4. Tobacco  dependence Commended on cutting back.  Encouraged her to quit.  Patient states it is her goal to quit.  She declines any medications to help her quit at this time.  Encouraged her to set a quit date.  Less than 5 minutes spent on counseling  5. Need for influenza vaccination Given  6. Need for Tdap vaccination Given  7. Encounter for screening mammogram for malignant neoplasm of breast - MM Digital Screening; Future  8. Gastroesophageal reflux disease, unspecified whether esophagitis present Continue Nexium.  GERD precautions discussed  9. Colon cancer screening Discussed colon cancer screening.  She is agreeable to doing the fit test - Fecal Globin By Immunochemistry    Patient was given the opportunity to ask questions.  Patient verbalized understanding of the plan and was able to repeat key elements of the plan.   ADDENDUM:  A1C added due to hyperglycemia on chem7.  Orders Placed This Encounter  Procedures  . MM Digital Screening  . CBC  . Lipid panel  . Comprehensive metabolic panel  . Fecal Globin By Immunochemistry     Requested Prescriptions    No prescriptions requested or ordered in this encounter    Return in about 6 weeks (around 08/04/2019) for PAP.  Jonah Blue, MD, FACP

## 2019-06-23 NOTE — Progress Notes (Signed)
Patient presents for vaccination against influenza and tetanus per orders of Dr. Johnson. Consent given. Counseling provided. No contraindications exists. Vaccine administered without incident.   

## 2019-06-25 LAB — COMPREHENSIVE METABOLIC PANEL
ALT: 20 IU/L (ref 0–32)
AST: 19 IU/L (ref 0–40)
Albumin/Globulin Ratio: 1.6 (ref 1.2–2.2)
Albumin: 4.2 g/dL (ref 3.8–4.9)
Alkaline Phosphatase: 83 IU/L (ref 39–117)
BUN/Creatinine Ratio: 20 (ref 9–23)
BUN: 18 mg/dL (ref 6–24)
Bilirubin Total: 0.4 mg/dL (ref 0.0–1.2)
CO2: 21 mmol/L (ref 20–29)
Calcium: 9.6 mg/dL (ref 8.7–10.2)
Chloride: 103 mmol/L (ref 96–106)
Creatinine, Ser: 0.89 mg/dL (ref 0.57–1.00)
GFR calc Af Amer: 87 mL/min/{1.73_m2} (ref >59)
GFR calc non Af Amer: 75 mL/min/{1.73_m2} (ref >59)
Globulin, Total: 2.7 g/dL (ref 1.5–4.5)
Glucose: 168 mg/dL — ABNORMAL HIGH (ref 65–99)
Potassium: 3.8 mmol/L (ref 3.5–5.2)
Sodium: 139 mmol/L (ref 134–144)
Total Protein: 6.9 g/dL (ref 6.0–8.5)

## 2019-06-25 LAB — LIPID PANEL
Chol/HDL Ratio: 3.6 ratio (ref 0.0–4.4)
Cholesterol, Total: 197 mg/dL (ref 100–199)
HDL: 55 mg/dL (ref >39)
LDL Chol Calc (NIH): 105 mg/dL — ABNORMAL HIGH (ref 0–99)
Triglycerides: 216 mg/dL — ABNORMAL HIGH (ref 0–149)
VLDL Cholesterol Cal: 37 mg/dL (ref 5–40)

## 2019-06-25 LAB — CBC
Hematocrit: 36.6 % (ref 34.0–46.6)
Hemoglobin: 12 g/dL (ref 11.1–15.9)
MCH: 29.9 pg (ref 26.6–33.0)
MCHC: 32.8 g/dL (ref 31.5–35.7)
MCV: 91 fL (ref 79–97)
Platelets: 279 10*3/uL (ref 150–450)
RBC: 4.01 x10E6/uL (ref 3.77–5.28)
RDW: 13.2 % (ref 11.7–15.4)
WBC: 7.9 10*3/uL (ref 3.4–10.8)

## 2019-06-25 NOTE — Addendum Note (Signed)
Addended by: Jonah Blue B on: 06/25/2019 11:49 AM   Modules accepted: Orders

## 2019-07-01 ENCOUNTER — Telehealth: Payer: Self-pay

## 2019-07-01 NOTE — Telephone Encounter (Signed)
Contacted pt to go over lab results pt is aware and doesn't have any questions or concerns 

## 2019-07-09 MED FILL — ?ATORVASTATIN 40MG TABLET: 40 | 30 days supply | Qty: 30 | Fill #3

## 2019-07-14 MED FILL — LISINOPRIL-HCTZ 20-25 MG TA: 20-25 | 30 days supply | Qty: 30 | Fill #3

## 2019-07-14 MED FILL — ?ESOMEPRAZOLE MAG DR 40MG C: 40 | 30 days supply | Qty: 30 | Fill #3

## 2019-07-18 LAB — SPECIMEN STATUS REPORT

## 2019-07-18 LAB — HEMOGLOBIN A1C
Est. average glucose Bld gHb Est-mCnc: 120 mg/dL
Hgb A1c MFr Bld: 5.8 % — ABNORMAL HIGH (ref 4.8–5.6)

## 2019-07-20 ENCOUNTER — Encounter: Payer: Self-pay | Admitting: Internal Medicine

## 2019-07-20 DIAGNOSIS — R7303 Prediabetes: Secondary | ICD-10-CM | POA: Insufficient documentation

## 2019-07-21 ENCOUNTER — Telehealth: Payer: Self-pay

## 2019-07-21 NOTE — Telephone Encounter (Signed)
Contacted pt to go over lab results pt is aware and doesn't have any questions or concerns 

## 2019-08-14 MED FILL — ?ATORVASTATIN 40MG TABLET: 40 | 30 days supply | Qty: 30 | Fill #4

## 2019-08-14 MED FILL — LISINOPRIL-HCTZ 20-25 MG TA: 20-25 | 30 days supply | Qty: 30 | Fill #4

## 2019-08-14 MED FILL — ?ESOMEPRAZOLE MAG DR 40MG C: 40 | 30 days supply | Qty: 30 | Fill #4

## 2019-09-17 MED FILL — ?ATORVASTATIN 40MG TABLET: 40 | 30 days supply | Qty: 30 | Fill #5

## 2019-09-17 MED FILL — ?ESOMEPRAZOLE MAG DR 40MG C: 40 | 30 days supply | Qty: 30 | Fill #5

## 2019-09-17 MED FILL — LISINOPRIL-HYDROCHLOROTHIAZ: 20-25 | 30 days supply | Qty: 30 | Fill #5

## 2019-10-24 ENCOUNTER — Other Ambulatory Visit: Payer: Self-pay | Admitting: Physician Assistant

## 2019-10-24 DIAGNOSIS — I10 Essential (primary) hypertension: Secondary | ICD-10-CM

## 2019-10-29 MED FILL — LISINOPRIL-HYDROCHLOROTHIAZ: 20-25 | 30 days supply | Qty: 30 | Fill #0

## 2019-11-12 ENCOUNTER — Ambulatory Visit: Payer: Self-pay | Attending: Physician Assistant | Admitting: Physician Assistant

## 2019-11-12 ENCOUNTER — Encounter: Payer: Self-pay | Admitting: Physician Assistant

## 2019-11-12 ENCOUNTER — Other Ambulatory Visit: Payer: Self-pay | Admitting: Physician Assistant

## 2019-11-12 DIAGNOSIS — E7800 Pure hypercholesterolemia, unspecified: Secondary | ICD-10-CM

## 2019-11-12 DIAGNOSIS — K219 Gastro-esophageal reflux disease without esophagitis: Secondary | ICD-10-CM

## 2019-11-12 DIAGNOSIS — I10 Essential (primary) hypertension: Secondary | ICD-10-CM

## 2019-11-12 MED ORDER — LISINOPRIL-HYDROCHLOROTHIAZIDE 20-25 MG PO TABS: 1.0000 | ORAL_TABLET | Freq: Every day | ORAL | 1 refills | Status: DC

## 2019-11-12 MED ORDER — ESOMEPRAZOLE MAGNESIUM 40 MG PO CPDR: 40.0000 mg | DELAYED_RELEASE_CAPSULE | Freq: Every day | ORAL | 1 refills | Status: DC

## 2019-11-12 MED ORDER — ATORVASTATIN CALCIUM 40 MG PO TABS: 40.0000 mg | ORAL_TABLET | Freq: Every day | ORAL | 1 refills | Status: DC

## 2019-11-12 MED FILL — ESOMEPRAZOLE MAG DR 40 MG C: 40 | 30 days supply | Qty: 30 | Fill #0

## 2019-11-12 MED FILL — ATORVASTATIN CALCIUM 40 MG: 40 | 30 days supply | Qty: 30 | Fill #0

## 2019-11-12 NOTE — Progress Notes (Signed)
Virtual Visit via Telephone Note  I connected with Kristi Cisneros on 11/12/19 at  4:10 PM EDT by telephone and verified that I am speaking with the correct person using two identifiers.   I discussed the limitations, risks, security and privacy concerns of performing an evaluation and management service by telephone and the availability of in person appointments. I also discussed with the patient that there may be a patient responsible charge related to this service. The patient expressed understanding and agreed to proceed.  PATIENT visit by telephone virtually in the context of Covid-19 pandemic. Patient location:  home My Location:  [facility] office Persons on the call:  Me and the patient   History of Present Illness: Patient needs RF of meds.  No CP.  No HA/dizziness/sob.  Last labs in 06/2019.  BP ~120s/80s(her mom has a cuff).  No new issues or concerns.     Observations/Objective:  NAD.  A&Ox3   Assessment and Plan:  1. Pure hypercholesterolemia - atorvastatin (LIPITOR) 40 MG tablet; Take 1 tablet (40 mg total) by mouth daily.  Dispense: 90 tablet; Refill: 1  2. Gastroesophageal reflux disease - esomeprazole (NEXIUM) 40 MG capsule; Take 1 capsule (40 mg total) by mouth daily. Must have office visit for refills  Dispense: 90 capsule; Refill: 1  3. Essential hypertension controlled - lisinopril-hydrochlorothiazide (ZESTORETIC) 20-25 MG tablet; Take 1 tablet by mouth daily.  Dispense: 90 tablet; Refill: 1    Follow Up Instructions: 2-3 months see PCP   I discussed the assessment and treatment plan with the patient. The patient was provided an opportunity to ask questions and all were answered. The patient agreed with the plan and demonstrated an understanding of the instructions.   The patient was advised to call back or seek an in-person evaluation if the symptoms worsen or if the condition fails to improve as anticipated.  I provided 9 minutes of non-face-to-face time during  this encounter.   Georgian Co, PA-C

## 2019-11-25 MED FILL — LISINOPRIL-HYDROCHLOROTHIAZ: 20-25 | 30 days supply | Qty: 30 | Fill #0

## 2019-12-11 MED FILL — ESOMEPRAZOLE MAG DR 40 MG C: 40 | 30 days supply | Qty: 30 | Fill #1

## 2019-12-11 MED FILL — ATORVASTATIN CALCIUM 40 MG: 40 | 30 days supply | Qty: 30 | Fill #1

## 2019-12-23 MED FILL — LISINOPRIL-HYDROCHLOROTHIAZ: 20-25 | 30 days supply | Qty: 30 | Fill #1

## 2019-12-30 MED FILL — LISINOPRIL-HYDROCHLOROTHIAZ: 20-25 | 30 days supply | Qty: 30 | Fill #1

## 2020-01-12 MED FILL — ATORVASTATIN CALCIUM 40 MG: 40 | 30 days supply | Qty: 30 | Fill #2

## 2020-01-12 MED FILL — ESOMEPRAZOLE MAG DR 40 MG C: 40 | 30 days supply | Qty: 30 | Fill #2

## 2020-01-14 ENCOUNTER — Emergency Department (HOSPITAL_COMMUNITY)
Admission: EM | Admit: 2020-01-14 | Discharge: 2020-01-14 | Disposition: A | Discharge status: Home / Self Care | Payer: Self-pay | Attending: Emergency Medicine | Admitting: Emergency Medicine

## 2020-01-14 ENCOUNTER — Other Ambulatory Visit: Payer: Self-pay

## 2020-01-14 ENCOUNTER — Encounter (HOSPITAL_COMMUNITY): Payer: Self-pay | Admitting: Emergency Medicine

## 2020-01-14 ENCOUNTER — Emergency Department (HOSPITAL_COMMUNITY): Payer: Self-pay

## 2020-01-14 DIAGNOSIS — F17210 Nicotine dependence, cigarettes, uncomplicated: Secondary | ICD-10-CM | POA: Insufficient documentation

## 2020-01-14 DIAGNOSIS — M25571 Pain in right ankle and joints of right foot: Secondary | ICD-10-CM | POA: Insufficient documentation

## 2020-01-14 DIAGNOSIS — I10 Essential (primary) hypertension: Secondary | ICD-10-CM | POA: Insufficient documentation

## 2020-01-14 NOTE — ED Provider Notes (Signed)
[facility]-EMERGENCY DEPT Provider Note   CSN: [id number] Arrival date & time: 01/14/20  1417     History Chief Complaint  Patient presents with  . Ankle Pain    Kristi Cisneros is a 54 y.o. female with past medical history significant for arthritis, GERD, hypertension, prediabetes.  HPI Patient presents to emergency department today with chief complaint of right ankle pain x 1 month. She states pain has been intermittent ever since 2014 when she sprained her ankle. This current episode of pain has been intermittent x 1 month. She describes the pain as throbbing and sharp. Pain is located on lateral malleolus and occasionally radiates up her calf. She rates pain 6/10 in severity. She owns a [employer] and reports pain is typically worse at the end of the day. She has tried taking advil with minimal symptom relief. She denies fever, chills, numbness, tingling, weakness, rash.      Past Medical History:  Diagnosis Date  . Arthritis   . GERD (gastroesophageal reflux disease)   . Hypertension Dx 2015    Patient Active Problem List   Diagnosis Date Noted  . Prediabetes 07/20/2019  . GERD (gastroesophageal reflux disease) 04/05/2015  . Vitamin D deficiency 11/23/2014  . Bilateral hand numbness 11/20/2014  . Subcutaneous nodule of chest wall 11/20/2014  . Left shoulder pain 11/20/2014  . Menorrhagia 09/03/2014  . Patellofemoral arthritis of right knee 08/19/2014  . Right knee pain 05/22/2014  . Right ankle pain 04/17/2014  . Plantar fasciitis, bilateral 03/27/2014  . Achilles tendonitis 03/27/2014  . HLD (hyperlipidemia) 03/06/2014  . Current smoker 03/04/2014  . Hypertension     Past Surgical History:  Procedure Laterality Date  . KNEE ARTHROSCOPY Right 01/26/2015   Procedure: ARTHROSCOPY KNEE, DEBRIDEMENT;  Surgeon: Cammy Copa, MD;  Location: [facility] OR;  Service: Orthopedics;  Laterality: Right;  . NO PAST SURGERIES       OB History   No  obstetric history on file.     Family History  Problem Relation Age of Onset  . Hypertension Mother   . Diabetes Mother   . Hypertension Brother     Social History   Tobacco Use  . Smoking status: Current Every Day Smoker    Packs/day: 0.50    Years: 20.00    Pack years: 10.00    Types: Cigarettes  . Smokeless tobacco: Never Used  Substance Use Topics  . Alcohol use: No    Comment: socially- ocassional  . Drug use: No    Home Medications Prior to Admission medications   Medication Sig Start Date End Date Taking? Authorizing Provider  atorvastatin (LIPITOR) 40 MG tablet Take 1 tablet (40 mg total) by mouth daily. 11/12/19   Anders Simmonds, PA-C  esomeprazole (NEXIUM) 40 MG capsule Take 1 capsule (40 mg total) by mouth daily. Must have office visit for refills 11/12/19   Georgian Co M, PA-C  lisinopril-hydrochlorothiazide (ZESTORETIC) 20-25 MG tablet Take 1 tablet by mouth daily. 11/12/19   Anders Simmonds, PA-C    Allergies    Levaquin [levofloxacin in d5w]  Review of Systems   Review of Systems All other systems are reviewed and are negative for acute change except as noted in the HPI.  Physical Exam Updated Vital Signs BP (!) 164/106 (BP Location: Left Arm)   Pulse 94   Temp 98.5 F (36.9 C) (Oral)   Resp 18   SpO2 99%   Physical Exam Vitals and nursing note reviewed.  Constitutional:      Appearance: She is well-developed. She is not ill-appearing or toxic-appearing.  HENT:     Head: Normocephalic and atraumatic.     Nose: Nose normal.  Eyes:     General: No scleral icterus.       Right eye: No discharge.        Left eye: No discharge.     Conjunctiva/sclera: Conjunctivae normal.  Neck:     Vascular: No JVD.  Cardiovascular:     Rate and Rhythm: Normal rate and regular rhythm.     Pulses: Normal pulses.          Dorsalis pedis pulses are 2+ on the right side and 2+ on the left side.     Heart sounds: Normal heart sounds.  Pulmonary:      Effort: Pulmonary effort is normal.     Breath sounds: Normal breath sounds.  Abdominal:     General: There is no distension.  Musculoskeletal:        General: Normal range of motion.     Cervical back: Normal range of motion.     Right lower leg: No edema.     Left lower leg: No edema.     Comments: There is swelling and tenderness over the lateral malleolus. No overt deformity. No tenderness over the medial aspect of the ankle. The fifth metatarsal is not tender. The ankle joint is intact without excessive opening on stressing. No break in skin. Good pedal pulse and cap refill of all toes. Wiggling toes without difficulty.  Compartments in right lower extremity are soft.  Skin:    General: Skin is warm and dry.  Neurological:     Mental Status: She is oriented to person, place, and time.     GCS: GCS eye subscore is 4. GCS verbal subscore is 5. GCS motor subscore is 6.     Comments: Fluent speech, no facial droop.  Psychiatric:        Behavior: Behavior normal.     ED Results / Procedures / Treatments   Labs (all labs ordered are listed, but only abnormal results are displayed) Labs Reviewed - No data to display  EKG None  Radiology DG Ankle Complete Right  Result Date: 01/14/2020 CLINICAL DATA:  Twisting injury.  Posterior and lateral pain. EXAM: RIGHT ANKLE - COMPLETE 3+ VIEW COMPARISON:  08/14/2012 FINDINGS: Lateral soft tissue swelling. No visible fracture or dislocation. Large plantar calcaneal spur. IMPRESSION: Lateral soft tissue swelling. No visible fracture or dislocation. Electronically Signed   By: Paulina Fusi M.D.   On: 01/14/2020 14:52   DG Foot Complete Right  Result Date: 01/14/2020 CLINICAL DATA:  Twisting injury EXAM: RIGHT FOOT COMPLETE - 3+ VIEW COMPARISON:  None. FINDINGS: No evidence of fracture or dislocation. Chronic abutment changes of the third and fourth metatarsals could relate to chronic foot pain. IMPRESSION: No acute finding. Chronic abutment  changes of the third and fourth metatarsals could relate to chronic foot pain. Electronically Signed   By: Paulina Fusi M.D.   On: 01/14/2020 14:54    Procedures Procedures (including critical care time)  Medications Ordered in ED Medications - No data to display  ED Course  I have reviewed the triage vital signs and the nursing notes.  Pertinent labs & imaging results that were available during my care of the patient were reviewed by me and considered in my medical decision making (see chart for details).    MDM Rules/Calculators/A&P  History provided by patient with additional history obtained from chart review.    Patient presents to the ED with complaints of pain to the ankle s/p ankle inversion. Exam without obvious deformity or open wounds. ROM intact. Tender to palpation over lateral malleolus with mild edema. NVI distally. Xray negative for fracture/dislocation. Therapeutic splint provided. PRICE and motrin recommended. I discussed results, treatment plan, need for orthopedics follow-up, and return precautions with the patient. Provided opportunity for questions, patient confirmed understanding and are in agreement with plan.    Portions of this note were generated with Scientist, clinical (histocompatibility and immunogenetics). Dictation errors may occur despite best attempts at proofreading.   Final Clinical Impression(s) / ED Diagnoses Final diagnoses:  Acute right ankle pain    Rx / DC Orders ED Discharge Orders    None       Sherene Sires, PA-C 01/14/20 1552    Jacalyn Lefevre, MD 01/15/20 [phone]

## 2020-01-14 NOTE — ED Triage Notes (Signed)
Pt c/o right ankle pain for about a month.

## 2020-01-14 NOTE — Discharge Instructions (Addendum)
Wear the boot for comfort.  You can take three 200mg  ibuprofen and one 500mg  tylenol at night to help with pain as we discussed.  Elevate your ankle as much as possible when you are not working. Wear an ice wrap to help with swelling when you are not wearing the boot.  Follow up with [facility] as soon as possible

## 2020-01-15 ENCOUNTER — Telehealth: Payer: Self-pay | Admitting: Internal Medicine

## 2020-01-15 NOTE — Telephone Encounter (Signed)
Pt was call an schedule a financial appt on 01/22/20

## 2020-01-15 NOTE — Telephone Encounter (Signed)
Copied from CRM [id number]. Topic: General - Inquiry >> Jan 14, 2020  5:13 PM Leary Roca wrote: Reason for CRM: Pt is calling in regards to her orange card .  Please call pt back at [phone] ( Home )

## 2020-01-22 ENCOUNTER — Ambulatory Visit: Payer: Self-pay

## 2020-01-27 ENCOUNTER — Ambulatory Visit: Payer: Self-pay | Admitting: Internal Medicine

## 2020-01-27 MED FILL — LISINOPRIL-HYDROCHLOROTHIAZ: 20-25 | 30 days supply | Qty: 30 | Fill #2

## 2020-01-30 ENCOUNTER — Ambulatory Visit: Payer: Self-pay

## 2020-02-04 ENCOUNTER — Encounter: Payer: Self-pay | Admitting: Orthopedic Surgery

## 2020-02-04 ENCOUNTER — Ambulatory Visit: Payer: Self-pay | Admitting: Orthopedic Surgery

## 2020-02-04 VITALS — Ht 67.0 in | Wt 227.0 lb

## 2020-02-04 DIAGNOSIS — M25571 Pain in right ankle and joints of right foot: Secondary | ICD-10-CM

## 2020-02-04 NOTE — Progress Notes (Signed)
Office Visit Note   Patient: Kristi Cisneros           Date of Birth: 10/04/1965           MRN: [id number] Visit Date: 02/04/2020 Requested by: Kristi Matar, MD [address] [place],  [place] [address] PCP: Kristi Matar, MD  Subjective: Chief Complaint  Patient presents with  . Right Ankle - Pain    HPI: Labria is a 54 year old patient with right ankle pain.  She fell through a porch 2014 and was told she had a sprain.  Has been hurting her since then.  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  Reports increased pain with standing.  Felt a pop in the posterior right ankle.  Seen in the emergency department with x-rays which are reviewed and which showed no fracture.  Advil helped at first but now not so much.  Reports pain primarily in the posterior lateral aspect of the ankle.  Radiates posterior medially as well.  Pain occurs on a daily basis but is not really stopping her from doing what she needs to or wants to do.  It does hurt her to walk most of the time.              ROS: All systems reviewed are negative as they relate to the chief complaint within the history of present illness.  Patient denies  fevers or chills.   Assessment & Plan: Visit Diagnoses:  1. Pain in right ankle and joints of right foot     Plan: Impression is right ankle pain posterior lateral radiating posterior medial.  Hard to say exactly what is going on here.  No loss trigonum.  Partial tearing of the peroneal tendons possible.  Achilles is nontender and functional.  Plantaris rupture could be a possibility but there is not much in the way of swelling in the posterior aspect of the ankle.  No real plantar fascial pain negative squeeze test on the calcaneus.  Need MRI scan right ankle to evaluate posterior lateral pain and swelling.  She did have a trauma years ago with another trauma and mechanical pop 2 months ago with no response to conservative treatment  follow-up after that study  Follow-Up Instructions: Return for after MRI.   Orders:  Orders Placed This Encounter  Procedures  . MR Ankle Right w/o contrast   No orders of the defined types were placed in this encounter.     Procedures: No procedures performed   Clinical Data: No additional findings.  Objective: Vital Signs: Ht 5\' 7"  (1.702 m)   Wt 227 lb (103 kg)   BMI 35.55 kg/m   Physical Exam:   Constitutional: Patient appears well-developed HEENT:  Head: Normocephalic Eyes:EOM are normal Neck: Normal range of motion Cardiovascular: Normal rate Pulmonary/chest: Effort normal Neurologic: Patient is alert Skin: Skin is warm Psychiatric: Patient has normal mood and affect    Ortho Exam: Ortho exam demonstrates full active and passive range of motion of both ankles.  Not much in terms of instability difference right versus left with anterior drawer testing and varus tilt testing.  Palpable intact nontender anterior to posterior tib and Achilles tendons on the right but slightly tender peroneal tendons.  No subluxation of the peroneal tendons with active or passive range of motion of the right ankle.  No pain with transverse tarsal range of motion.  Subtalar motion intact.  Negative squeeze test on the  calcaneus.  Ankle plantarflexion strength on the right is intact.  Specialty Comments:  No specialty comments available.  Imaging: No results found.   PMFS History: Patient Active Problem List   Diagnosis Date Noted  . Prediabetes 07/20/2019  . GERD (gastroesophageal reflux disease) 04/05/2015  . Vitamin D deficiency 11/23/2014  . Bilateral hand numbness 11/20/2014  . Subcutaneous nodule of chest wall 11/20/2014  . Left shoulder pain 11/20/2014  . Menorrhagia 09/03/2014  . Patellofemoral arthritis of right knee 08/19/2014  . Right knee pain 05/22/2014  . Right ankle pain 04/17/2014  . Plantar fasciitis, bilateral 03/27/2014  . Achilles tendonitis  03/27/2014  . HLD (hyperlipidemia) 03/06/2014  . Current smoker 03/04/2014  . Hypertension    Past Medical History:  Diagnosis Date  . Arthritis   . GERD (gastroesophageal reflux disease)   . Hypertension Dx 2015    Family History  Problem Relation Age of Onset  . Hypertension Mother   . Diabetes Mother   . Hypertension Brother     Past Surgical History:  Procedure Laterality Date  . KNEE ARTHROSCOPY Right 01/26/2015   Procedure: ARTHROSCOPY KNEE, DEBRIDEMENT;  Surgeon: Cammy Copa, MD;  Location: [facility] OR;  Service: Orthopedics;  Laterality: Right;  . NO PAST SURGERIES     Social History   Occupational History  . 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 Smoking status: Current Every Day Smoker    Packs/day: 0.50    Years: 20.00    Pack years: 10.00    Types: Cigarettes  . Smokeless tobacco: Never Used  Substance and Sexual Activity  . Alcohol use: No    Comment: socially- ocassional  . Drug use: No  . Sexual activity: Yes    Birth control/protection: None

## 2020-02-12 MED FILL — ESOMEPRAZOLE MAG DR 40 MG C: 40 | 30 days supply | Qty: 30 | Fill #3

## 2020-02-12 MED FILL — ATORVASTATIN CALCIUM 40 MG: 40 | 30 days supply | Qty: 30 | Fill #3

## 2020-02-25 ENCOUNTER — Other Ambulatory Visit: Payer: Self-pay

## 2020-02-25 ENCOUNTER — Ambulatory Visit
Admission: RE | Admit: 2020-02-25 | Discharge: 2020-02-25 | Disposition: A | Discharge status: Home / Self Care | Payer: Self-pay | Source: Ambulatory Visit | Attending: Orthopedic Surgery | Admitting: Orthopedic Surgery

## 2020-02-25 DIAGNOSIS — M25571 Pain in right ankle and joints of right foot: Secondary | ICD-10-CM

## 2020-02-26 MED FILL — LISINOPRIL-HYDROCHLOROTHIAZ: 20-25 | 30 days supply | Qty: 30 | Fill #3

## 2020-03-04 MED FILL — LISINOPRIL-HYDROCHLOROTHIAZ: 20-25 | 30 days supply | Qty: 30 | Fill #3

## 2020-03-17 ENCOUNTER — Other Ambulatory Visit: Payer: Self-pay

## 2020-03-17 ENCOUNTER — Ambulatory Visit (INDEPENDENT_AMBULATORY_CARE_PROVIDER_SITE_OTHER): Payer: Self-pay | Admitting: Orthopedic Surgery

## 2020-03-17 DIAGNOSIS — M25571 Pain in right ankle and joints of right foot: Secondary | ICD-10-CM

## 2020-03-18 ENCOUNTER — Other Ambulatory Visit: Payer: Self-pay | Admitting: Internal Medicine

## 2020-03-18 ENCOUNTER — Other Ambulatory Visit: Payer: Self-pay

## 2020-03-18 ENCOUNTER — Encounter: Payer: Self-pay | Admitting: Internal Medicine

## 2020-03-18 ENCOUNTER — Ambulatory Visit: Payer: Self-pay | Attending: Internal Medicine | Admitting: Internal Medicine

## 2020-03-18 VITALS — BP 117/79 | HR 107 | Temp 98.2°F | Ht 67.0 in | Wt 243.0 lb

## 2020-03-18 DIAGNOSIS — Z1211 Encounter for screening for malignant neoplasm of colon: Secondary | ICD-10-CM

## 2020-03-18 DIAGNOSIS — Z803 Family history of malignant neoplasm of breast: Secondary | ICD-10-CM | POA: Insufficient documentation

## 2020-03-18 DIAGNOSIS — Z9229 Personal history of other drug therapy: Secondary | ICD-10-CM

## 2020-03-18 DIAGNOSIS — F17200 Nicotine dependence, unspecified, uncomplicated: Secondary | ICD-10-CM

## 2020-03-18 DIAGNOSIS — Z23 Encounter for immunization: Secondary | ICD-10-CM

## 2020-03-18 DIAGNOSIS — I10 Essential (primary) hypertension: Secondary | ICD-10-CM

## 2020-03-18 DIAGNOSIS — Z1231 Encounter for screening mammogram for malignant neoplasm of breast: Secondary | ICD-10-CM

## 2020-03-18 DIAGNOSIS — E7800 Pure hypercholesterolemia, unspecified: Secondary | ICD-10-CM

## 2020-03-18 DIAGNOSIS — G4709 Other insomnia: Secondary | ICD-10-CM

## 2020-03-18 MED ORDER — TRAZODONE HCL 50 MG PO TABS: 25.0000 mg | ORAL_TABLET | Freq: Every evening | ORAL | 3 refills | Status: DC | PRN

## 2020-03-18 MED FILL — ATORVASTATIN CALCIUM 40 MG: 40 | 30 days supply | Qty: 30 | Fill #4

## 2020-03-18 MED FILL — ESOMEPRAZOLE MAG DR 40 MG C: 40 | 30 days supply | Qty: 30 | Fill #4

## 2020-03-18 NOTE — Progress Notes (Signed)
Patient ID: Kristi Cisneros, female    DOB: August 26, 1965  MRN: [id number]  CC: Hypertension   Subjective: Kristi Cisneros is a 54 y.o. female who presents for chronic ds Her concerns today include:  Patient with history of HTN, HL, GERD, tobacco dependence, CTS  Sister dx with breast CA recently at age 41.  She tested positve for the BRCA2.  Materal aunt also had breast cancer, not sure at what age.  She would like to be tested for the BRCA gene Pt has never had a MMG.  Referral placed when I saw her earlier this year but never materialized Working on  form for OC  HTN:  Has home device, checks few x/wk.  Reports blood pressure readings have been good. Compliant with taking medications.  Denies chest pains, shortness of breath, lower extremity edema.  HL:  Taking and tolerating Lipitor  Obesity:  Gained 16 lbs since Sept 2021.  Still cleans houses.  She attributes weight gain to not eating during the day but then overeating once she gets home.    Tob dep:  Same.  Less than 1/2 pk per day.  She feels that she needs to quit but is not mentally ready to do so.    Complains of problems staying asleep for the past 4 to 5 months.  She usually gets in bed between 9:51 PM.  She watches TV for about 30 to 45 minutes and then falls asleep.  She stays asleep for about 2 to 3 hours and then wakes up and is up for about an hour to an hour and a half before being able to fall asleep again.  She wakes up feeling tired in the mornings.  Her alarm clock is set for 5 AM.  She admits to drinking Coke and Dr. Malachi Bonds or a glass of tea in the evenings.  She does get some hot flashes at night but does not feel that this is contributing to her issue.   HM:  Due for PAP, MMG.   Complete Pfizer COVID vaccine Patient Active Problem List   Diagnosis Date Noted  . Family history of breast cancer 03/18/2020  . Other insomnia 03/18/2020  . Class 2 severe obesity due to excess calories with serious comorbidity in  adult ([place]) 03/18/2020  . Prediabetes 07/20/2019  . GERD (gastroesophageal reflux disease) 04/05/2015  . Vitamin D deficiency 11/23/2014  . Bilateral hand numbness 11/20/2014  . Subcutaneous nodule of chest wall 11/20/2014  . Left shoulder pain 11/20/2014  . Menorrhagia 09/03/2014  . Patellofemoral arthritis of right knee 08/19/2014  . Right knee pain 05/22/2014  . Right ankle pain 04/17/2014  . Plantar fasciitis, bilateral 03/27/2014  . Achilles tendonitis 03/27/2014  . HLD (hyperlipidemia) 03/06/2014  . Current smoker 03/04/2014  . Hypertension      Current Outpatient Medications on File Prior to Visit  Medication Sig Dispense Refill  . atorvastatin (LIPITOR) 40 MG tablet Take 1 tablet (40 mg total) by mouth daily. 90 tablet 1  . esomeprazole (NEXIUM) 40 MG capsule Take 1 capsule (40 mg total) by mouth daily. Must have office visit for refills 90 capsule 1  . lisinopril-hydrochlorothiazide (ZESTORETIC) 20-25 MG tablet Take 1 tablet by mouth daily. 90 tablet 1   No current facility-administered medications on file prior to visit.    Allergies  Allergen Reactions  . Levaquin [Levofloxacin In D5w] Other (See Comments)    "makes me feel Weird"    Social History   Socioeconomic History  .  Marital status: Single    Spouse name: Kandra Nicolas   . Number of children: 1   . Years of education: 57   . Highest education level: Not on file  Occupational History  . Occupation: Conservation officer, historic buildings: [employer]     Comment: Media planner   Tobacco Use  . Smoking status: Current Every Day Smoker    Packs/day: 0.50    Years: 20.00    Pack years: 10.00    Types: Cigarettes  . Smokeless tobacco: Never Used  Substance and Sexual Activity  . Alcohol use: No    Comment: socially- ocassional  . Drug use: No  . Sexual activity: Yes    Birth control/protection: None  Other Topics Concern  . Not on file  Social History Narrative   Lives with Lennette Bihari (partner of 70  years) and 80 yo son.       Social Determinants of Health   Financial Resource Strain:   . Difficulty of Paying Living Expenses: Not on file  Food Insecurity:   . Worried About Charity fundraiser in the Last Year: Not on file  . Ran Out of Food in the Last Year: Not on file  Transportation Needs:   . Lack of Transportation (Medical): Not on file  . Lack of Transportation (Non-Medical): Not on file  Physical Activity:   . Days of Exercise per Week: Not on file  . Minutes of Exercise per Session: Not on file  Stress:   . Feeling of Stress : Not on file  Social Connections:   . Frequency of Communication with Friends and Family: Not on file  . Frequency of Social Gatherings with Friends and Family: Not on file  . Attends Religious Services: Not on file  . Active Member of Clubs or Organizations: Not on file  . Attends Archivist Meetings: Not on file  . Marital Status: Not on file  Intimate Partner Violence:   . Fear of Current or Ex-Partner: Not on file  . Emotionally Abused: Not on file  . Physically Abused: Not on file  . Sexually Abused: Not on file    Family History  Problem Relation Age of Onset  . Hypertension Mother   . Diabetes Mother   . Hypertension Brother   . Breast cancer Sister   . Breast cancer Maternal Aunt     Past Surgical History:  Procedure Laterality Date  . KNEE ARTHROSCOPY Right 01/26/2015   Procedure: ARTHROSCOPY KNEE, DEBRIDEMENT;  Surgeon: Meredith Pel, MD;  Location: [facility];  Service: Orthopedics;  Laterality: Right;  . NO PAST SURGERIES      ROS: Review of Systems Negative except as stated above  PHYSICAL EXAM: BP 117/79   Pulse (!) 107   Temp 98.2 F (36.8 C) (Oral)   Ht $R'5\' 7"'wp$  (1.702 m)   Wt 243 lb (110.2 kg)   SpO2 100%   BMI 38.06 kg/m   Wt Readings from Last 3 Encounters:  03/18/20 243 lb (110.2 kg)  02/04/20 227 lb (103 kg)  06/23/19 238 lb 6.4 oz (108.1 kg)    Physical Exam  General appearance -  alert, well appearing, and in no distress Mental status - normal mood, behavior, speech, dress, motor activity, and thought processes Neck - supple, no significant adenopathy Chest - clear to auscultation, no wheezes, rales or rhonchi, symmetric air entry Heart - normal rate, regular rhythm, normal S1, S2, no murmurs, rubs, clicks or gallops Extremities -  peripheral pulses normal, no pedal edema, no clubbing or cyanosis  CMP Latest Ref Rng & Units 06/23/2019 07/22/2018 11/27/2017  Glucose 65 - 99 mg/dL 516(U) 83 -  BUN 6 - 24 mg/dL 18 14 -  Creatinine 6.10 - 1.00 mg/dL 4.24 7.31 -  Sodium 924 - 144 mmol/L 139 141 -  Potassium 3.5 - 5.2 mmol/L 3.8 4.2 3.8  Chloride 96 - 106 mmol/L 103 101 -  CO2 20 - 29 mmol/L 21 24 -  Calcium 8.7 - 10.2 mg/dL 9.6 9.7 -  Total Protein 6.0 - 8.5 g/dL 6.9 6.8 -  Total Bilirubin 0.0 - 1.2 mg/dL 0.4 0.6 -  Alkaline Phos 39 - 117 IU/L 83 87 -  AST 0 - 40 IU/L 19 14 -  ALT 0 - 32 IU/L 20 19 -   Lipid Panel     Component Value Date/Time   CHOL 197 06/23/2019 1640   TRIG 216 (H) 06/23/2019 1640   HDL 55 06/23/2019 1640   CHOLHDL 3.6 06/23/2019 1640   CHOLHDL 5.3 (H) 05/23/2016 1234   VLDL 62 (H) 05/23/2016 1234   LDLCALC 105 (H) 06/23/2019 1640    CBC    Component Value Date/Time   WBC 7.9 06/23/2019 1640   WBC 5.8 01/26/2015 1340   RBC 4.01 06/23/2019 1640   RBC 4.39 01/26/2015 1340   HGB 12.0 06/23/2019 1640   HCT 36.6 06/23/2019 1640   PLT 279 06/23/2019 1640   MCV 91 06/23/2019 1640   MCH 29.9 06/23/2019 1640   MCH 31.2 01/26/2015 1340   MCHC 32.8 06/23/2019 1640   MCHC 33.8 01/26/2015 1340   RDW 13.2 06/23/2019 1640   LYMPHSABS 2.5 06/13/2017 1636   EOSABS 0.1 06/13/2017 1636   BASOSABS 0.0 06/13/2017 1636    ASSESSMENT AND PLAN:  1. Essential hypertension At goal.  Continue current medications and low-salt diet.  2. Pure hypercholesterolemia Continue atorvastatin - Lipid panel  3. Class 2 severe obesity due to excess calories  with serious comorbidity in adult, unspecified BMI (H[facility]) Discussed and encourage healthy eating habits and regular exercise.  Encouraged to avoid skipping meals so that she does not overeat at other meals. - Hemoglobin A1c  4. Other insomnia Good sleep hygiene discussed.  Advised that when she gets in bed she should turn off all lights and sounds.  Recommend that she discontinue drinking caffeinated beverages at least 4 to 5 hours before bedtime.  We will try her with a low-dose of trazodone. - traZODone (DESYREL) 50 MG tablet; Take 0.5-1 tablets (25-50 mg total) by mouth at bedtime as needed for sleep.  Dispense: 30 tablet; Refill: 3  5. Family history of breast cancer Given that her sister was diagnosed with breast cancer before the age of 45 and is tested positive for the BRCA2 gene, I recommend that she to see a Dentist and then get tested.  Referral submitted for genetic counselor.   6. Need for influenza vaccination Given  7. COVID-19 vaccine series completed   8. Screening for colon cancer Discussed methods of colon cancer screening.  She is uninsured.  She is willing to do the fit test. - Fecal occult blood, imunochemical([facility])  9. Encounter for screening mammogram for malignant neoplasm of breast Given that she has a first-degree relative who was tested positive for the BRCA gene, ideally she should be screened with mammogram and MRI and I have told her this.  She wants to hold off on the MRI until she is approved  for the orange card of the [facility] discount but is agreeable to getting the mammogram. - MM Digital Screening; Future  10.  Tobacco dependence. Advised to quit.  Discussed health risks associated with smoking.  Patient not ready to give a trial of quitting. Patient was given the opportunity to ask questions.  Patient verbalized understanding of the plan and was able to repeat key elements of the plan.   Orders Placed This Encounter  Procedures  .  Fecal occult blood, imunochemical([facility])  . MM Digital Screening  . Hemoglobin A1c  . Lipid panel  . Ambulatory referral to Genetics     Requested Prescriptions   Signed Prescriptions Disp Refills  . traZODone (DESYREL) 50 MG tablet 30 tablet 3    Sig: Take 0.5-1 tablets (25-50 mg total) by mouth at bedtime as needed for sleep.    Return in about 1 month (around 04/17/2020) for PAP.  Karle Plumber, MD, FACP

## 2020-03-18 NOTE — Progress Notes (Signed)
Sister tested positive for Cancer and she is wanting to be tested.  Not sleeping

## 2020-03-19 LAB — LIPID PANEL
Chol/HDL Ratio: 4 ratio (ref 0.0–4.4)
Cholesterol, Total: 222 mg/dL — ABNORMAL HIGH (ref 100–199)
HDL: 55 mg/dL (ref >39)
LDL Chol Calc (NIH): 124 mg/dL — ABNORMAL HIGH (ref 0–99)
Triglycerides: 246 mg/dL — ABNORMAL HIGH (ref 0–149)
VLDL Cholesterol Cal: 43 mg/dL — ABNORMAL HIGH (ref 5–40)

## 2020-03-19 LAB — HEMOGLOBIN A1C
Est. average glucose Bld gHb Est-mCnc: 123 mg/dL
Hgb A1c MFr Bld: 5.9 % — ABNORMAL HIGH (ref 4.8–5.6)

## 2020-03-19 MED FILL — traZODone HCL 50 MG TABS: 50 | 30 days supply | Qty: 30 | Fill #0

## 2020-03-20 ENCOUNTER — Other Ambulatory Visit: Payer: Self-pay | Admitting: Internal Medicine

## 2020-03-20 ENCOUNTER — Encounter: Payer: Self-pay | Admitting: Internal Medicine

## 2020-03-20 DIAGNOSIS — E7800 Pure hypercholesterolemia, unspecified: Secondary | ICD-10-CM

## 2020-03-20 MED ORDER — ATORVASTATIN CALCIUM 40 MG PO TABS: 60.0000 mg | ORAL_TABLET | Freq: Every day | ORAL | 1 refills | Status: DC

## 2020-03-22 ENCOUNTER — Telehealth: Payer: Self-pay | Admitting: Genetic Counselor

## 2020-03-22 ENCOUNTER — Encounter: Payer: Self-pay | Admitting: Orthopedic Surgery

## 2020-03-22 NOTE — Telephone Encounter (Signed)
Received a genetic counseling referral from Dr. Laural Benes for fhx of breast cancer. I returned Kristi Cisneros's call and scheduled her to see Clydie Braun on 12/6 at 2pm. Pt aware to arrive 15 minutes early.

## 2020-03-22 NOTE — Progress Notes (Signed)
Office Visit Note   Patient: Kristi Cisneros           Date of Birth: 1966-03-23           MRN: [id number] Visit Date: 03/17/2020 Requested by: Marcine Matar, MD [address] [place],  [place] [address] PCP: Marcine Matar, MD  Subjective: Chief Complaint  Patient presents with  . scan review    HPI: Kristi Cisneros is a 54 year old patient with right ankle pain.  Since have seen her she has had an MRI scan.  In general the most prominent finding is some fluid in the retrocalcaneal bursa.  Tendons look intact.  Not much in the way of structural problems in the ankle region.  Advil helps a little bit.  She has tried doing stretching.              ROS: All systems reviewed are negative as they relate to the chief complaint within the history of present illness.  Patient denies  fevers or chills.   Assessment & Plan: Visit Diagnoses: No diagnosis found.  Plan: Impression is right ankle pain with no definite structural problem with the tendons and not much in the way of arthritis.  She does have some retrocalcaneal bursitis.  Would not be inclined to do an injection in this region.  I think stretching and local modalities as well as wearing shoes with good arch supports would be the best way to treat this.  Follow-up as needed  Follow-Up Instructions: Return if symptoms worsen or fail to improve.   Orders:  No orders of the defined types were placed in this encounter.  No orders of the defined types were placed in this encounter.     Procedures: No procedures performed   Clinical Data: No additional findings.  Objective: Vital Signs: There were no vitals taken for this visit.  Physical Exam:   Constitutional: Patient appears well-developed HEENT:  Head: Normocephalic Eyes:EOM are normal Neck: Normal range of motion Cardiovascular: Normal rate Pulmonary/chest: Effort normal Neurologic: Patient is alert Skin: Skin is warm Psychiatric: Patient has normal mood and  affect    Ortho Exam: Ortho exam demonstrates normal gait alignment.  Patient does not have too much in the way of pes planus when she stands.  Palpable intact nontender anterior to posterior to peroneal Achilles tendons with intact pedal pulses.  No other masses lymphadenopathy or skin changes noted in that shoulder girdle region  Specialty Comments:  No specialty comments available.  Imaging: No results found.   PMFS History: Patient Active Problem List   Diagnosis Date Noted  . Family history of breast cancer 03/18/2020  . Other insomnia 03/18/2020  . Class 2 severe obesity due to excess calories with serious comorbidity in adult ([facility]) 03/18/2020  . Prediabetes 07/20/2019  . GERD (gastroesophageal reflux disease) 04/05/2015  . Vitamin D deficiency 11/23/2014  . Bilateral hand numbness 11/20/2014  . Subcutaneous nodule of chest wall 11/20/2014  . Left shoulder pain 11/20/2014  . Menorrhagia 09/03/2014  . Patellofemoral arthritis of right knee 08/19/2014  . Right knee pain 05/22/2014  . Right ankle pain 04/17/2014  . Plantar fasciitis, bilateral 03/27/2014  . Achilles tendonitis 03/27/2014  . HLD (hyperlipidemia) 03/06/2014  . Current smoker 03/04/2014  . Hypertension    Past Medical History:  Diagnosis Date  . Arthritis   . GERD (gastroesophageal reflux disease)   . Hypertension Dx 2015    Family History  Problem Relation Age of Onset  . Hypertension  Mother   . Diabetes Mother   . Hypertension Brother   . Breast cancer Sister   . Breast cancer Maternal Aunt     Past Surgical History:  Procedure Laterality Date  . KNEE ARTHROSCOPY Right 01/26/2015   Procedure: ARTHROSCOPY KNEE, DEBRIDEMENT;  Surgeon: Cammy Copa, MD;  Location: [facility] OR;  Service: Orthopedics;  Laterality: Right;  . NO PAST SURGERIES     Social History   Occupational History  . 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  Smoking status: Current Every Day Smoker    Packs/day: 0.50    Years: 20.00    Pack years: 10.00    Types: Cigarettes  . Smokeless tobacco: Never Used  Substance and Sexual Activity  . Alcohol use: No    Comment: socially- ocassional  . Drug use: No  . Sexual activity: Yes    Birth control/protection: None

## 2020-03-29 ENCOUNTER — Ambulatory Visit (INDEPENDENT_AMBULATORY_CARE_PROVIDER_SITE_OTHER): Payer: Self-pay | Admitting: Podiatry

## 2020-03-29 ENCOUNTER — Ambulatory Visit: Payer: Self-pay | Admitting: Podiatry

## 2020-03-29 ENCOUNTER — Encounter: Payer: Self-pay | Admitting: Podiatry

## 2020-03-29 ENCOUNTER — Other Ambulatory Visit: Payer: Self-pay

## 2020-03-29 DIAGNOSIS — M79671 Pain in right foot: Secondary | ICD-10-CM

## 2020-03-29 DIAGNOSIS — M7661 Achilles tendinitis, right leg: Secondary | ICD-10-CM

## 2020-03-29 NOTE — Patient Instructions (Signed)

## 2020-03-30 MED FILL — LISINOPRIL-HYDROCHLOROTHIAZ: 20-25 | 30 days supply | Qty: 30 | Fill #4

## 2020-04-06 NOTE — Progress Notes (Signed)
Subjective:   Patient ID: Kristi Cisneros, female   DOB: 54 y.o.   MRN: [id number]   HPI 54 year old female presents the office today for concerns of chronic right ankle pain she points along the Achilles tendon.  She said this started in 2014 when she stepped through a porch.  She had some swelling and pain which is been getting worse over the last 3 months.  She has previously seen orthopedics.  She has had an MRI as well.  She states they discussed physical therapy with insurance and due to cost she has not had this performed.  No other treatments.  No other concerns.   Review of Systems  All other systems reviewed and are negative.  Past Medical History:  Diagnosis Date  . Arthritis   . GERD (gastroesophageal reflux disease)   . Hypertension Dx 2015    Past Surgical History:  Procedure Laterality Date  . KNEE ARTHROSCOPY Right 01/26/2015   Procedure: ARTHROSCOPY KNEE, DEBRIDEMENT;  Surgeon: Cammy Copa, MD;  Location: [facility] OR;  Service: Orthopedics;  Laterality: Right;  . NO PAST SURGERIES       Current Outpatient Medications:  .  atorvastatin (LIPITOR) 40 MG tablet, Take 1.5 tablets (60 mg total) by mouth daily., Disp: 135 tablet, Rfl: 1 .  esomeprazole (NEXIUM) 40 MG capsule, Take 1 capsule (40 mg total) by mouth daily. Must have office visit for refills, Disp: 90 capsule, Rfl: 1 .  lisinopril-hydrochlorothiazide (ZESTORETIC) 20-25 MG tablet, Take 1 tablet by mouth daily., Disp: 90 tablet, Rfl: 1 .  traZODone (DESYREL) 50 MG tablet, Take 0.5-1 tablets (25-50 mg total) by mouth at bedtime as needed for sleep., Disp: 30 tablet, Rfl: 3  Allergies  Allergen Reactions  . Levaquin [Levofloxacin In D5w] Other (See Comments)    "makes me feel Weird"         Objective:  Physical Exam  General: AAO x3, NAD  Dermatological: Skin is warm, dry and supple bilateral. There are no open sores, no preulcerative lesions, no rash or signs of infection present.  Vascular: Dorsalis  Pedis artery and Posterior Tibial artery pedal pulses are 2/4 bilateral with immedate capillary fill time.  There is no pain with calf compression, swelling, warmth, erythema.   Neruologic: Grossly intact via light touch bilateral.  Negative Tinel sign.  Musculoskeletal: There is mild tenderness on the mid substance the Achilles tendon on the right side.  Thompson test is negative.  There is no area pinpoint tenderness no pain lateral compression of calcaneus.  No swelling edema.  No erythema or warmth.  Muscular strength 5/5 in all groups tested bilateral.  Gait: Unassisted, Nonantalgic.       Assessment:   54 year old female with Achilles tendinitis    Plan:  -Treatment options discussed including all alternatives, risks, and complications -Etiology of symptoms were discussed -Independent review the MRI that she had previously.  MRI did reveal mild to moderate.  Insertional Achilles tendinopathy without tear as well as retrocalcaneal bursitis.  Minimal plantar fasciitis. -We discussed stretching exercises daily.  Dispensed a heel lift.  Discussed shoe modifications and orthotics.  Voltaren gel.  Vivi Barrack DPM

## 2020-04-12 MED FILL — ATORVASTATIN CALCIUM 40 MG: 40 | 30 days supply | Qty: 30 | Fill #5

## 2020-04-12 MED FILL — ESOMEPRAZOLE MAG DR 40 MG C: 40 | 30 days supply | Qty: 30 | Fill #5

## 2020-04-19 ENCOUNTER — Inpatient Hospital Stay: Payer: Medicaid Other | Attending: Genetic Counselor | Admitting: Genetic Counselor

## 2020-04-19 ENCOUNTER — Inpatient Hospital Stay: Payer: Medicaid Other

## 2020-04-19 ENCOUNTER — Telehealth: Payer: Self-pay | Admitting: Genetic Counselor

## 2020-04-19 ENCOUNTER — Ambulatory Visit: Payer: Self-pay | Admitting: Internal Medicine

## 2020-04-19 NOTE — Telephone Encounter (Signed)
Called pt per 12/6 sch msg - no answer. Left message for pt to call back to reschedule.

## 2020-05-04 MED FILL — LISINOPRIL-HYDROCHLOROTHIAZ: 20-25 | 30 days supply | Qty: 30 | Fill #5

## 2020-05-11 ENCOUNTER — Other Ambulatory Visit: Payer: Self-pay | Admitting: Physician Assistant

## 2020-05-11 DIAGNOSIS — K219 Gastro-esophageal reflux disease without esophagitis: Secondary | ICD-10-CM

## 2020-05-11 MED FILL — ATORVASTATIN CALCIUM 40 MG: 40 | 30 days supply | Qty: 45 | Fill #0

## 2020-05-11 MED FILL — ESOMEPRAZOLE MAG DR 40 MG C: 40 | 30 days supply | Qty: 30 | Fill #0

## 2020-05-18 ENCOUNTER — Ambulatory Visit: Payer: Self-pay | Admitting: *Deleted

## 2020-05-18 NOTE — Telephone Encounter (Signed)
Attempted to call patient- left message to call back  

## 2020-05-18 NOTE — Telephone Encounter (Signed)
Patient called in to say that she had cold symptoms so she took a home Covid test that came out positive and need to speak with a nurse to gain understanding of what she need to do. Please call Ph# [phone]   Attempted to call patient- left message to call back on VM

## 2020-05-18 NOTE — Telephone Encounter (Signed)
fyi

## 2020-05-18 NOTE — Telephone Encounter (Signed)
Patient called to report that she was having COVID-19 symptoms and did a home test and it was positive.  Her symptoms started Sunday with chills stuffy nose and tickle in throat.  She states worst symptom is nasal congestion.  She has infrequent cough.   Symptom tiers and treatment plans for each were read to patient.  Criteria for ending isolation were read to patient. Quarantine recommendations were discussed for others in her home. Good preventative practices were reviewed. She verbalized understanding of all information.  Call note will be routed to office for follow up. Reason for Disposition . [1] HIGH RISK patient AND [2] influenza is widespread in the community AND [3] ONE OR MORE respiratory symptoms: cough, sore throat, runny or stuffy nose  Answer Assessment - Initial Assessment Questions 1. COVID-19 DIAGNOSIS: "Who made your COVID-19 diagnosis?" "Was it confirmed by a positive lab test?" If not diagnosed by a HCP, ask "Are there lots of cases (community spread) where you live?" Note: See public health department website, if unsure.     yes 2. COVID-19 EXPOSURE: "Was there any known exposure to COVID before the symptoms began?" CDC Definition of close contact: within 6 feet (2 meters) for a total of 15 minutes or more over a 24-hour period.      none 3. ONSET: "When did the COVID-19 symptoms start?"     Sunday with chills tickle in throat nasal congestion 4. WORST SYMPTOM: "What is your worst symptom?" (e.g., cough, fever, shortness of breath, muscle aches)     Nasal congestion 5. COUGH: "Do you have a cough?" If Yes, ask: "How bad is the cough?"       mild 6. FEVER: "Do you have a fever?" If Yes, ask: "What is your temperature, how was it measured, and when did it start?"    Unsure Chill Sunday night 7. RESPIRATORY STATUS: "Describe your breathing?" (e.g., shortness of breath, wheezing, unable to speak)      None 8. BETTER-SAME-WORSE: "Are you getting better, staying the same or  getting worse compared to yesterday?"  If getting worse, ask, "In what way?"     better 9. HIGH RISK DISEASE: "Do you have any chronic medical problems?" (e.g., asthma, heart or lung disease, weak immune system, obesity, etc.)     hypertension 10. VACCINE: "Have you gotten the COVID-19 vaccine?" If Yes ask: "Which one, how many shots, when did you get it?"       Yes does no booster 11. PREGNANCY: "Is there any chance you are pregnant?" "When was your last menstrual period?"       No 12. OTHER SYMPTOMS: "Do you have any other symptoms?"  (e.g., chills, fatigue, headache, loss of smell or taste, muscle pain, sore throat; new loss of smell or taste especially support the diagnosis of COVID-19)      Tired  Protocols used: CORONAVIRUS (COVID-19) DIAGNOSED OR SUSPECTED-A-AH

## 2020-05-20 NOTE — Telephone Encounter (Signed)
Contacted pt to go over Dr. Johnson message pt is aware and doesn't have any questions or concerns  

## 2020-06-08 ENCOUNTER — Ambulatory Visit: Payer: Self-pay | Admitting: Internal Medicine

## 2020-06-08 ENCOUNTER — Telehealth: Payer: Self-pay | Admitting: Internal Medicine

## 2020-06-09 ENCOUNTER — Other Ambulatory Visit: Payer: Self-pay | Admitting: Physician Assistant

## 2020-06-09 DIAGNOSIS — K219 Gastro-esophageal reflux disease without esophagitis: Secondary | ICD-10-CM

## 2020-06-09 DIAGNOSIS — I10 Essential (primary) hypertension: Secondary | ICD-10-CM

## 2020-06-09 MED FILL — ATORVASTATIN CALCIUM 40 MG: 40 | 30 days supply | Qty: 45 | Fill #1

## 2020-06-10 MED FILL — LISINOPRIL-HYDROCHLOROTHIAZ: 20-25 | 30 days supply | Qty: 30 | Fill #0

## 2020-06-10 MED FILL — ESOMEPRAZOLE MAG DR 40 MG C: 40 | 30 days supply | Qty: 30 | Fill #0

## 2020-06-25 ENCOUNTER — Ambulatory Visit: Payer: Self-pay | Admitting: Internal Medicine

## 2020-07-07 MED FILL — ATORVASTATIN CALCIUM 40 MG: 40 | 30 days supply | Qty: 45 | Fill #2

## 2020-07-07 MED FILL — ESOMEPRAZOLE MAG DR 40 MG C: 40 | 30 days supply | Qty: 30 | Fill #1

## 2020-07-07 MED FILL — LISINOPRIL-HYDROCHLOROTHIAZ: 20-25 | 30 days supply | Qty: 30 | Fill #1

## 2020-08-03 MED FILL — traZODone HCL 50 MG TABS: 50 | 30 days supply | Qty: 30 | Fill #1

## 2020-08-03 MED FILL — LISINOPRIL-HYDROCHLOROTHIAZ: 20-25 | 30 days supply | Qty: 30 | Fill #2

## 2020-08-03 MED FILL — ATORVASTATIN CALCIUM 40 MG: 40 | 30 days supply | Qty: 45 | Fill #3

## 2020-08-03 MED FILL — ESOMEPRAZOLE MAG DR 40 MG C: 40 | 30 days supply | Qty: 30 | Fill #2

## 2020-08-14 ENCOUNTER — Other Ambulatory Visit: Payer: Self-pay

## 2020-09-06 ENCOUNTER — Other Ambulatory Visit: Payer: Self-pay

## 2020-09-06 MED FILL — Esomeprazole Magnesium Cap Delayed Release 40 MG (Base Eq): ORAL | 30 days supply | Qty: 30 | Fill #0 | Status: AC

## 2020-09-06 MED FILL — Atorvastatin Calcium Tab 40 MG (Base Equivalent): ORAL | 30 days supply | Qty: 45 | Fill #0 | Status: AC

## 2020-09-06 MED FILL — Lisinopril & Hydrochlorothiazide Tab 20-25 MG: ORAL | 90 days supply | Qty: 90 | Fill #0 | Status: AC

## 2020-09-08 ENCOUNTER — Other Ambulatory Visit: Payer: Self-pay

## 2020-09-13 ENCOUNTER — Encounter: Payer: Self-pay | Admitting: Physician Assistant

## 2020-09-13 ENCOUNTER — Ambulatory Visit (INDEPENDENT_AMBULATORY_CARE_PROVIDER_SITE_OTHER): Payer: 59 | Admitting: Physician Assistant

## 2020-09-13 ENCOUNTER — Ambulatory Visit (INDEPENDENT_AMBULATORY_CARE_PROVIDER_SITE_OTHER): Payer: 59

## 2020-09-13 DIAGNOSIS — M7061 Trochanteric bursitis, right hip: Secondary | ICD-10-CM

## 2020-09-13 DIAGNOSIS — M25551 Pain in right hip: Secondary | ICD-10-CM

## 2020-09-13 MED ORDER — LIDOCAINE HCL 1 % IJ SOLN
3.0000 mL | INTRAMUSCULAR | Status: AC | PRN
  Administered 2020-09-13: 3 mL

## 2020-09-13 MED ORDER — METHYLPREDNISOLONE ACETATE 40 MG/ML IJ SUSP
40.0000 mg | INTRAMUSCULAR | Status: AC | PRN
  Administered 2020-09-13: 40 mg via INTRA_ARTICULAR

## 2020-09-13 NOTE — Progress Notes (Signed)
Office Visit Note   Patient: Kristi Cisneros           Date of Birth: 31-May-1965           MRN: [id number] Visit Date: 09/13/2020              Requested by: Kristi Matar, MD [address] [place],  [place] [address] PCP: Kristi Matar, MD   Assessment & Plan: Visit Diagnoses:  1. Trochanteric bursitis of right hip     Plan: She shown IT band stretching exercises.  We will see her back in around 2 weeks to see what type of response she had to the injection and stretching.  Post injection she had decreased pain over the lateral aspect of the hip.  Questions were encouraged and answered.  Follow-Up Instructions: Return in about 2 weeks (around 09/27/2020).   Orders:  Orders Placed This Encounter  Procedures  . Large Joint Inj: R greater trochanter  . XR HIP UNILAT W OR W/O PELVIS 2-3 VIEWS RIGHT   No orders of the defined types were placed in this encounter.     Procedures: Large Joint Inj: R greater trochanter on 09/13/2020 1:40 PM Indications: pain Details: 22 G 1.5 in needle, lateral approach  Arthrogram: No  Medications: 3 mL lidocaine 1 %; 40 mg methylPREDNISolone acetate 40 MG/ML Outcome: tolerated well, no immediate complications Procedure, treatment alternatives, risks and benefits explained, specific risks discussed. Consent was given by the patient. Immediately prior to procedure a time out was called to verify the correct patient, procedure, equipment, support staff and site/side marked as required. Patient was prepped and draped in the usual sterile fashion.       Clinical Data: No additional findings.   Subjective: Chief Complaint  Patient presents with  . Right Hip - Pain    HPI Kristi Cisneros is 55 year old female comes in today with right hip pain.  She notes that she has had some discomfort in the lateral aspect of the right hip for some time but it became worse since this past Saturday.  She was playing golf and had something pop in the  lateral aspect of her hip.  She felt like something was out of place and then heard a second pop when she was going up and down the stairs.  Since that time she has had some discomfort but overall pain is improving.  She is having no numbness tingling down the leg.  Has been taking Advil for the pain.  Pain is mostly lateral aspect of the hip and buttocks region on the right only.  Denies any groin pain.  Review of Systems Negative for fevers or chills.  Objective: Vital Signs: There were no vitals taken for this visit.  Physical Exam Constitutional:      Appearance: She is not ill-appearing or diaphoretic.  Neurological:     Mental Status: She is alert and oriented to person, place, and time.  Psychiatric:        Mood and Affect: Mood normal.     Ortho Exam Bilateral hips good range of motion of both hips.  Extremes of internal and external rotation of the right hip causes discomfort.  She has severe tenderness with palpation over the right hip trochanteric region and down the IT band.  Slight tenderness over the left hip trochanteric region.  Good range of motion of bilateral knees without pain. Specialty Comments:  No specialty comments available.  Imaging: XR HIP UNILAT W OR  W/O PELVIS 2-3 VIEWS RIGHT  Result Date: 09/13/2020 AP pelvis lateral view right hip: Both hips are well located on the pelvis view.  Right hip well-preserved.  No acute fractures no bony abnormalities.    PMFS History: Patient Active Problem List   Diagnosis Date Noted  . Family history of breast cancer 03/18/2020  . Other insomnia 03/18/2020  . Class 2 severe obesity due to excess calories with serious comorbidity in adult (HCC) 03/18/2020  . Prediabetes 07/20/2019  . GERD (gastroesophageal reflux disease) 04/05/2015  . Vitamin D deficiency 11/23/2014  . Bilateral hand numbness 11/20/2014  . Subcutaneous nodule of chest wall 11/20/2014  . Left shoulder pain 11/20/2014  . Menorrhagia 09/03/2014  .  Patellofemoral arthritis of right knee 08/19/2014  . Right knee pain 05/22/2014  . Right ankle pain 04/17/2014  . Plantar fasciitis, bilateral 03/27/2014  . Achilles tendonitis 03/27/2014  . HLD (hyperlipidemia) 03/06/2014  . Current smoker 03/04/2014  . Hypertension    Past Medical History:  Diagnosis Date  . Arthritis   . GERD (gastroesophageal reflux disease)   . Hypertension Dx 2015    Family History  Problem Relation Age of Onset  . Hypertension Mother   . Diabetes Mother   . Hypertension Brother   . Breast cancer Sister   . Breast cancer Maternal Aunt     Past Surgical History:  Procedure Laterality Date  . KNEE ARTHROSCOPY Right 01/26/2015   Procedure: ARTHROSCOPY KNEE, DEBRIDEMENT;  Surgeon: Cammy Copa, MD;  Location: [facility] OR;  Service: Orthopedics;  Laterality: Right;  . NO PAST SURGERIES     Social History   Occupational History  . 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 Smoking status: Current Every Day Smoker    Packs/day: 0.50    Years: 20.00    Pack years: 10.00    Types: Cigarettes  . Smokeless tobacco: Never Used  Substance and Sexual Activity  . Alcohol use: No    Comment: socially- ocassional  . Drug use: No  . Sexual activity: Yes    Birth control/protection: None

## 2020-10-13 MED FILL — Atorvastatin Calcium Tab 40 MG (Base Equivalent): ORAL | 30 days supply | Qty: 45 | Fill #1 | Status: AC

## 2020-10-13 MED FILL — Esomeprazole Magnesium Cap Delayed Release 40 MG (Base Eq): ORAL | 30 days supply | Qty: 30 | Fill #1 | Status: AC

## 2020-10-14 ENCOUNTER — Other Ambulatory Visit: Payer: Self-pay

## 2020-11-08 ENCOUNTER — Ambulatory Visit (INDEPENDENT_AMBULATORY_CARE_PROVIDER_SITE_OTHER): Payer: 59 | Admitting: Orthopaedic Surgery

## 2020-11-08 ENCOUNTER — Other Ambulatory Visit: Payer: Self-pay

## 2020-11-08 ENCOUNTER — Ambulatory Visit
Admission: RE | Admit: 2020-11-08 | Discharge: 2020-11-08 | Disposition: A | Discharge status: Home / Self Care | Payer: Medicaid Other | Source: Ambulatory Visit | Attending: Internal Medicine | Admitting: Internal Medicine

## 2020-11-08 ENCOUNTER — Ambulatory Visit (INDEPENDENT_AMBULATORY_CARE_PROVIDER_SITE_OTHER): Payer: 59

## 2020-11-08 DIAGNOSIS — G8929 Other chronic pain: Secondary | ICD-10-CM

## 2020-11-08 DIAGNOSIS — M542 Cervicalgia: Secondary | ICD-10-CM

## 2020-11-08 DIAGNOSIS — M25512 Pain in left shoulder: Secondary | ICD-10-CM

## 2020-11-08 DIAGNOSIS — Z1231 Encounter for screening mammogram for malignant neoplasm of breast: Secondary | ICD-10-CM

## 2020-11-08 MED ORDER — TIZANIDINE HCL 4 MG PO TABS: 4.0000 mg | ORAL_TABLET | Freq: Three times a day (TID) | ORAL | 0 refills | Status: DC | PRN

## 2020-11-08 MED ORDER — METHYLPREDNISOLONE 4 MG PO TABS: ORAL_TABLET | ORAL | 0 refills | Status: DC

## 2020-11-08 MED ORDER — LIDOCAINE HCL 1 % IJ SOLN
3.0000 mL | INTRAMUSCULAR | Status: AC | PRN
  Administered 2020-11-08: 3 mL

## 2020-11-08 MED ORDER — METHYLPREDNISOLONE ACETATE 40 MG/ML IJ SUSP
40.0000 mg | INTRAMUSCULAR | Status: AC | PRN
  Administered 2020-11-08: 40 mg via INTRA_ARTICULAR

## 2020-11-08 NOTE — Progress Notes (Signed)
Office Visit Note   Patient: Kristi Cisneros           Date of Birth: 1965/07/12           MRN: [id number] Visit Date: 11/08/2020              Requested by: Marcine Matar, MD [address] [place],  [place] [address] PCP: Marcine Matar, MD   Assessment & Plan: Visit Diagnoses:  1. Chronic left shoulder pain   2. Neck pain     Plan: The patient does have significant impingement of the left shoulder.  I recommended a combination of steroid injection in that left shoulder subacromial outlet combined with a steroid taper, anti-inflammatories over-the-counter and a muscle relaxant.  I do feel that she would benefit from outpatient physical therapy given the nature of her work and she agrees with this as well.  She did tolerate steroid injection in her left shoulder subacromial outlet.  All questions and concerns were answered addressed.  We will see her back in 6 weeks after course of physical therapy on her left shoulder.  Follow-Up Instructions: Return in about 6 years (around 11/09/2026).   Orders:  Orders Placed This Encounter  Procedures   Large Joint Inj   XR Shoulder Left   XR Cervical Spine 2 or 3 views   Meds ordered this encounter  Medications   methylPREDNISolone (MEDROL) 4 MG tablet    Sig: Medrol dose pack. Take as instructed    Dispense:  21 tablet    Refill:  0   tiZANidine (ZANAFLEX) 4 MG tablet    Sig: Take 1 tablet (4 mg total) by mouth every 8 (eight) hours as needed for muscle spasms.    Dispense:  30 tablet    Refill:  0      Procedures: Large Joint Inj: L subacromial bursa on 11/08/2020 10:16 AM Indications: pain and diagnostic evaluation Details: 22 G 1.5 in needle  Arthrogram: No  Medications: 3 mL lidocaine 1 %; 40 mg methylPREDNISolone acetate 40 MG/ML Outcome: tolerated well, no immediate complications Procedure, treatment alternatives, risks and benefits explained, specific risks discussed. Consent was given by the patient.  Immediately prior to procedure a time out was called to verify the correct patient, procedure, equipment, support staff and site/side marked as required. Patient was prepped and draped in the usual sterile fashion.      Clinical Data: No additional findings.   Subjective: Chief Complaint  Patient presents with   Left Shoulder - Pain  The patient comes in to see me for left shoulder pain that has been getting worse for several months now with no known injury.  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  She reports decreased strength and range of motion of the shoulder and tenderness to the touch.  It does not radiate into her hand but does go up into the shoulder.  She is not diabetic.  She is never injured the shoulder before and she is now having problems reaching behind her and overhead due to the pain with her left shoulder.  HPI  Review of Systems   Objective: Vital Signs: There were no vitals taken for this visit.  Physical Exam She is alert and orient x3 and in no acute distress Ortho Exam Examination of her left shoulder shows decreased abduction and decreased external rotation.  There is also decreased internal rotation with adduction.  There is definitely evidence of impingement.  The shoulder is well located.  It is hard to get a good strength exam due to the nature of her pain of her left shoulder. Specialty Comments:  No specialty comments available.  Imaging: XR Cervical Spine 2 or 3 views  Result Date: 11/08/2020 2 views of the cervical spine show loss of cervical lordosis.  XR Shoulder Left  Result Date: 11/08/2020 3 views of the left shoulder show well located shoulder with no acute findings.  There is moderate severe AC joint arthritic changes.    PMFS History: Patient Active Problem List   Diagnosis Date Noted   Family history of breast cancer 03/18/2020   Other insomnia  03/18/2020   Class 2 severe obesity due to excess calories with serious comorbidity in adult (HCC) 03/18/2020   Prediabetes 07/20/2019   GERD (gastroesophageal reflux disease) 04/05/2015   Vitamin D deficiency 11/23/2014   Bilateral hand numbness 11/20/2014   Subcutaneous nodule of chest wall 11/20/2014   Left shoulder pain 11/20/2014   Menorrhagia 09/03/2014   Patellofemoral arthritis of right knee 08/19/2014   Right knee pain 05/22/2014   Right ankle pain 04/17/2014   Plantar fasciitis, bilateral 03/27/2014   Achilles tendonitis 03/27/2014   HLD (hyperlipidemia) 03/06/2014   Current smoker 03/04/2014   Hypertension    Past Medical History:  Diagnosis Date   Arthritis    GERD (gastroesophageal reflux disease)    Hypertension Dx 2015    Family History  Problem Relation Age of Onset   Hypertension Mother    Diabetes Mother    Hypertension Brother    Breast cancer Sister    Breast cancer Maternal Aunt     Past Surgical History:  Procedure Laterality Date   KNEE ARTHROSCOPY Right 01/26/2015   Procedure: ARTHROSCOPY KNEE, DEBRIDEMENT;  Surgeon: Cammy Copa, MD;  Location: [facility] OR;  Service: Orthopedics;  Laterality: Right;   NO PAST SURGERIES     Social History   Occupational History   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

## 2020-11-09 ENCOUNTER — Other Ambulatory Visit: Payer: Self-pay | Admitting: Internal Medicine

## 2020-11-09 ENCOUNTER — Telehealth: Payer: Self-pay

## 2020-11-09 ENCOUNTER — Other Ambulatory Visit: Payer: Self-pay

## 2020-11-09 DIAGNOSIS — E7800 Pure hypercholesterolemia, unspecified: Secondary | ICD-10-CM

## 2020-11-09 MED ORDER — PANTOPRAZOLE SODIUM 40 MG PO TBEC
40.0000 mg | DELAYED_RELEASE_TABLET | Freq: Every day | ORAL | 3 refills | Status: DC
  Filled 2020-11-09: qty 30, 30d supply, fill #0
  Filled 2020-12-17: qty 30, 30d supply, fill #1

## 2020-11-09 MED FILL — Esomeprazole Magnesium Cap Delayed Release 40 MG (Base Eq): ORAL | 30 days supply | Qty: 30 | Fill #2 | Status: CN

## 2020-11-09 NOTE — Telephone Encounter (Signed)
Nexium is non-formulary on patient's ins. If appropriate, can it be changed to Protonix?

## 2020-11-09 NOTE — Telephone Encounter (Signed)
   Notes to clinic:  Review directions for refill    Requested Prescriptions  Pending Prescriptions Disp Refills   atorvastatin (LIPITOR) 40 MG tablet 135 tablet 1      Cardiovascular:  Antilipid - Statins Failed - 11/09/2020  7:34 AM      Failed - Total Cholesterol in normal range and within 360 days    Cholesterol, Total  Date Value Ref Range Status  03/18/2020 222 (H) 100 - 199 mg/dL Final          Failed - LDL in normal range and within 360 days    LDL Chol Calc (NIH)  Date Value Ref Range Status  03/18/2020 124 (H) 0 - 99 mg/dL Final          Failed - Triglycerides in normal range and within 360 days    Triglycerides  Date Value Ref Range Status  03/18/2020 246 (H) 0 - 149 mg/dL Final          Passed - HDL in normal range and within 360 days    HDL  Date Value Ref Range Status  03/18/2020 55 >39 mg/dL Final          Passed - Patient is not pregnant      Passed - Valid encounter within last 12 months    Recent Outpatient Visits           7 months ago Essential hypertension   [facility] Marcine Matar, MD   12 months ago Pure hypercholesterolemia   [facility] [place], [place], [place]   1 year ago Need for influenza vaccination   [facility] [place]ux[place], Cornelius Moras, RPH-CPP   1 year ago Essential hypertension   [facility] Marcine Matar, MD   1 year ago Hot flashes   [facility] [place], [place], [place]       Future Appointments             In 1 month Magnus Ivan, Vanita Panda, MD [facility]

## 2020-11-09 NOTE — Addendum Note (Signed)
Addended by: Jonah Blue B on: 11/09/2020 12:54 PM   Modules accepted: Orders

## 2020-11-10 ENCOUNTER — Other Ambulatory Visit: Payer: Self-pay

## 2020-11-16 ENCOUNTER — Other Ambulatory Visit: Payer: Self-pay | Admitting: Internal Medicine

## 2020-11-16 ENCOUNTER — Other Ambulatory Visit: Payer: Self-pay

## 2020-11-16 DIAGNOSIS — E7800 Pure hypercholesterolemia, unspecified: Secondary | ICD-10-CM

## 2020-11-16 NOTE — Telephone Encounter (Signed)
Medication Refill - Medication: atorvastatin (LIPITOR) 40 MG tablet  Has the patient contacted their pharmacy? No.  Preferred Pharmacy (with phone number or street name): [facility]  Phone:  [phone] Fax:  [phone]  Agent: Please be advised that RX refills may take up to 3 business days. We ask that you follow-up with your pharmacy.

## 2020-11-16 NOTE — Telephone Encounter (Signed)
Requested medication (s) are due for refill today - yes  Requested medication (s) are on the active medication list -yes  Future visit scheduled -yes  Last refill: 8 months  Notes to clinic: Patient has notes - needs appointment- patient has scheduled 8/30  Requested Prescriptions  Pending Prescriptions Disp Refills   atorvastatin (LIPITOR) 40 MG tablet 135 tablet 1    Sig: TAKE 1.5 TABLETS (60 MG TOTAL) BY MOUTH DAILY.      Cardiovascular:  Antilipid - Statins Failed - 11/16/2020  2:41 PM      Failed - Total Cholesterol in normal range and within 360 days    Cholesterol, Total  Date Value Ref Range Status  03/18/2020 222 (H) 100 - 199 mg/dL Final          Failed - LDL in normal range and within 360 days    LDL Chol Calc (NIH)  Date Value Ref Range Status  03/18/2020 124 (H) 0 - 99 mg/dL Final          Failed - Triglycerides in normal range and within 360 days    Triglycerides  Date Value Ref Range Status  03/18/2020 246 (H) 0 - 149 mg/dL Final          Passed - HDL in normal range and within 360 days    HDL  Date Value Ref Range Status  03/18/2020 55 >39 mg/dL Final          Passed - Patient is not pregnant      Passed - Valid encounter within last 12 months    Recent Outpatient Visits           8 months ago Essential hypertension   [facility] Marcine Matar, MD   1 year ago Pure hypercholesterolemia   [facility] [place], [place], [place]   1 year ago Need for influenza vaccination   [facility] Lois Huxley, Cornelius Moras, RPH-CPP   1 year ago Essential hypertension   [facility] Marcine Matar, MD   1 year ago Hot flashes   [facility] [place], [place], [place]       Future Appointments             In 1 month Magnus Ivan Vanita Panda, MD [facility]   In 1 month Marcine Matar, MD  [facility]                 Requested Prescriptions  Pending Prescriptions Disp Refills   atorvastatin (LIPITOR) 40 MG tablet 135 tablet 1    Sig: TAKE 1.5 TABLETS (60 MG TOTAL) BY MOUTH DAILY.      Cardiovascular:  Antilipid - Statins Failed - 11/16/2020  2:41 PM      Failed - Total Cholesterol in normal range and within 360 days    Cholesterol, Total  Date Value Ref Range Status  03/18/2020 222 (H) 100 - 199 mg/dL Final          Failed - LDL in normal range and within 360 days    LDL Chol Calc (NIH)  Date Value Ref Range Status  03/18/2020 124 (H) 0 - 99 mg/dL Final          Failed - Triglycerides in normal range and within 360 days    Triglycerides  Date Value Ref Range Status  03/18/2020 246 (  H) 0 - 149 mg/dL Final          Passed - HDL in normal range and within 360 days    HDL  Date Value Ref Range Status  03/18/2020 55 >39 mg/dL Final          Passed - Patient is not pregnant      Passed - Valid encounter within last 12 months    Recent Outpatient Visits           8 months ago Essential hypertension   [facility] Marcine Matar, MD   1 year ago Pure hypercholesterolemia   [facility] [place], [place], [place]   1 year ago Need for influenza vaccination   [facility] Lois Huxley, Cornelius Moras, RPH-CPP   1 year ago Essential hypertension   [facility] Marcine Matar, MD   1 year ago Hot flashes   [facility] [place], [place], [place]       Future Appointments             In 1 month Magnus Ivan, Vanita Panda, MD [facility]   In 1 month Marcine Matar, MD [facility]

## 2020-11-17 ENCOUNTER — Other Ambulatory Visit: Payer: Self-pay

## 2020-11-17 MED ORDER — ATORVASTATIN CALCIUM 40 MG PO TABS
60.0000 mg | ORAL_TABLET | Freq: Every day | ORAL | 0 refills | Status: DC
  Filled 2020-11-17: qty 45, 30d supply, fill #0

## 2020-11-18 ENCOUNTER — Other Ambulatory Visit: Payer: Self-pay

## 2020-11-23 ENCOUNTER — Encounter: Payer: Self-pay | Admitting: Physical Therapy

## 2020-11-23 ENCOUNTER — Ambulatory Visit (INDEPENDENT_AMBULATORY_CARE_PROVIDER_SITE_OTHER): Payer: 59 | Admitting: Physical Therapy

## 2020-11-23 ENCOUNTER — Other Ambulatory Visit: Payer: Self-pay

## 2020-11-23 DIAGNOSIS — G8929 Other chronic pain: Secondary | ICD-10-CM

## 2020-11-23 DIAGNOSIS — M6281 Muscle weakness (generalized): Secondary | ICD-10-CM | POA: Diagnosis not present

## 2020-11-23 DIAGNOSIS — M542 Cervicalgia: Secondary | ICD-10-CM | POA: Diagnosis not present

## 2020-11-23 DIAGNOSIS — M25512 Pain in left shoulder: Secondary | ICD-10-CM

## 2020-11-23 NOTE — Patient Instructions (Signed)
Access Code: [email or web address] URL: [email or web address] Date: 11/23/2020 Prepared by: Zebedee Iba  Exercises Isometric Shoulder External Rotation - 2 x daily - 7 x weekly - 3 sets - 10 reps Seated Upper Trapezius Stretch - 2 x daily - 7 x weekly - 1 sets - 3 reps - 30 hold Gentle Levator Scapulae Stretch - 2 x daily - 7 x weekly - 1 sets - 3 reps - 30 hold

## 2020-11-23 NOTE — Therapy (Addendum)
[facility] Physical Therapy [address] [place], [place], [address] Phone: [phone]   Fax:  [phone]  Physical Therapy Eval  Patient Details  Name: Kristi Cisneros MRN: [id number] Date of Birth: 1965/08/27 Referring Provider (PT): Kathryne Hitch, MD   Encounter Date: 11/23/2020   PT End of Session - 11/23/20 1506     Visit Number 1    Number of Visits 17    Date for PT Re-Evaluation 02/21/21    Authorization Type [employer]    PT Start Time 1510    PT Stop Time 1600    PT Time Calculation (min) 50 min    Activity Tolerance Patient tolerated treatment well;No increased pain    Behavior During Therapy WFL for tasks assessed/performed             Past Medical History:  Diagnosis Date   Arthritis    GERD (gastroesophageal reflux disease)    Hypertension Dx 2015    Past Surgical History:  Procedure Laterality Date   KNEE ARTHROSCOPY Right 01/26/2015   Procedure: ARTHROSCOPY KNEE, DEBRIDEMENT;  Surgeon: Cammy Copa, MD;  Location: [facility] OR;  Service: Orthopedics;  Laterality: Right;   NO PAST SURGERIES      There were no vitals filed for this visit.   Subjective Assessment - 11/23/20 1518     Subjective Pt states that 3-4 months ago she started having pain into her L shoulder. She denies specific MOI. Pt locates pain to anterior shoulder and it is tender to touch.  Pt states it feels like a "bee sting," pinch, or sharp pain. Pt states it feels painful and stiff in the morning. She feels that it loosens up as the day goes along. 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 She does at least 3-4 clients/homes a day. Current 6/10, Worst 9/10, 3/10; Aggs: HK positions, working, letting arm dangle, driving arm postion, sleeping on L side; Eases: medication, heat, hot showers. Pt is limited in ADL and OH reaching due to pain. Pt states there is popping/catching in the shoulder. Pt states her hands go numb all the time L>R. Pt does have L  sided neck pain. Pt denies cancer red flags. Pt denies 5Ds 3Ns.    Diagnostic tests X-ray c/s decreased lordosis, L shoulder AC joint OA    Patient Stated Goals Pt states she would like to avoid surgery.    Currently in Pain? Yes    Pain Score 6     Pain Location Shoulder    Pain Orientation Left    Pain Descriptors / Indicators Aching;Sore;Spasm    Pain Type Chronic pain                [facility] PT Assessment - 11/23/20 1509       Assessment   Medical Diagnosis M25.512,G89.29 (ICD-10-CM) - Chronic left shoulder pain  M54.2 (ICD-10-CM) - Neck pain    Referring Provider (PT) Kathryne Hitch, MD    Hand Dominance Left      Precautions   Precautions None      Restrictions   Weight Bearing Restrictions No      Balance Screen   Has the patient fallen in the past 6 months No      Home Environment   Living Environment Private residence      Prior Function   Level of Independence Independent      Cognition   Overall Cognitive Status Within Functional Limits for tasks assessed  Observation/Other Assessments   Focus on Therapeutic Outcomes (FOTO)  63  (71 at D/C)      Sensation   Light Touch Appears Intact      AROM   Overall AROM Comments C/S limited in R SB; L flexion 125 deg ABD 80, ER reach to occiput , IR reach to L SIJ; R WFL   pain with all directions     PROM   Overall PROM Comments flexion 140, ABD 110, ER 60, IR 75   painful/guarding once past 90     Strength   Overall Strength Comments 4/5 throughout L   painful with all directions     Palpation   Palpation comment TTP UT, LS, infra, supra, deltoid      Special Tests    Special Tests Cervical;Rotator Cuff Impingement;Biceps/Labral Tests    Cervical Tests Spurling's;Dictraction    Rotator Cuff Impingment tests Neer impingement test;Hawkins- Kennedy test;Belly Press;Empty Can test;Painful Arc of Motion      Spurling's   Findings Negative      Distraction Test   Findngs Negative      Neer  Impingement test    Findings Positive      Hawkins-Kennedy test   Findings Positive      Belly Press   Findings Negative      Empty Can test   Findings Positive      Painful Arc of Motion   Findings Positive                           [facility] Adult PT Treatment/Exercise - 11/23/20 1515       Shoulder Exercises: Seated   Other Seated Exercises L shoulder ER isometric GENTLE 10x 3s hold      Shoulder Exercises: Stretch   Other Shoulder Stretches UT and LS stretch 30s 3x each      Manual Therapy   Manual Therapy Soft tissue mobilization    Soft tissue mobilization L UT, supra, infra                    PT Education - 11/23/20 1556     Education Details MOI, diagnosis, prognosis, anatomy, exercise progression, DOMS expectations, muscle firing, HEP, joint protection, postural changes, POC, thermotherapy    Person(s) Educated Patient    Methods Explanation;Demonstration;Tactile cues;Verbal cues;Handout    Comprehension Verbalized understanding;Verbal cues required;Tactile cues required;Returned demonstration              PT Short Term Goals - 11/23/20 1508       PT SHORT TERM GOAL #1   Title Pt will become independent with HEP in order to demonstrate synthesis of PT education.    Time 2    Period Weeks    Status New      PT SHORT TERM GOAL #2   Title Pt will be able to demonstrate BHB and OH reaching without pain in order to demonstrate functional improvement in UEfunction for self-care and house hold duties.    Time 4    Period Weeks    Status New      PT SHORT TERM GOAL #3   Title Pt will be able to reach [facility] and grab/reach/hold >1 lbs in order to demonstrate functional improvement in L UE function.    Time 4    Period Weeks    Status New               PT Long Term  Goals - 11/23/20 1509       PT LONG TERM GOAL #1   Title Pt will become independent with final HEP in order to demonstrate synthesis of PT education.    Time 2     Period Weeks    Status New      PT LONG TERM GOAL #2   Title Pt will score >/= 71 on FOTO to demonstrate functional improvement in L shoulder and neck pain.    Time 8    Period Weeks    Status New      PT LONG TERM GOAL #3   Title Pt will demonstrate/report ability sleep on L side without pain in order to demonstrate functional improvement and tolerance to static positioning.    Baseline Pt will be able to reach [facility] and carry/reach/hold >10 lbs in order to demonstrate functional improvement in L UE function for return to normal occupational duties.    Time 8    Period Weeks    Status New      PT LONG TERM GOAL #4   Title Pt will be able to demonstrate and report ability to perform sustained OH reaching without pain or NT in order to demonstrate functional improvement in UE function foroccupational and house hold duties.                   Plan - 11/23/20 1507     Clinical Impression Statement Pt is a 55 y.o. female presenting to PT eval today with CC of L shoulder pain. Pt presents with pain limited ROM, increased muscle spasm, and muscle weakness. Pt's s/s appear consistent with subacromial pain syndrome with likely RTC inflammation/irritation from repetitive occupational overuse related injury. Pt pain likely due to functional capacity/strenght deficits for daily load demands. Pt symptoms are highly sensitive and irritable. Pt with significant UT and infra spasm at today's session. Pt had neutral response to STM at end of session but will plan to assess for latent effects at subsequent treatment session. Pt's impairments limit participation with ADL, occupation, and recreation.  Pt would benefit from continued skilled therapy in order to reach goals and maximize functional L UE strength and ROM for full return to PLOF.    Personal Factors and Comorbidities Time since onset of injury/illness/exacerbation    Examination-Activity Limitations Lift;Carry;Other;Reach Overhead     Examination-Participation Restrictions Occupation;Community Activity;Laundry;Cleaning    Stability/Clinical Decision Making Stable/Uncomplicated    Clinical Decision Making Low    Rehab Potential Good    PT Frequency 2x / week    PT Duration 8 weeks    PT Treatment/Interventions ADLs/Self Care Home Management;Iontophoresis 4mg /ml Dexamethasone;Moist Heat;Ultrasound;Traction;Aquatic Therapy;Electrical Stimulation;Cryotherapy;Functional mobility training;Therapeutic activities;Therapeutic exercise;Neuromuscular re-education;Patient/family education;Manual techniques;Passive range of motion;Dry needling;Vasopneumatic Device;Joint Manipulations;Spinal Manipulations    PT Next Visit Plan review HEP, wall push up, sleeper/post capsule stretch, DN PRN    PT Home Exercise Plan Access Code: [email or web address]    Consulted and Agree with Plan of Care Patient             Patient will benefit from skilled therapeutic intervention in order to improve the following deficits and impairments:  Improper body mechanics, Pain, Postural dysfunction, Impaired flexibility, Impaired UE functional use, Decreased mobility, Increased muscle spasms, Decreased range of motion, Decreased strength, Hypomobility  Visit Diagnosis: Chronic left shoulder pain  Muscle weakness (generalized)  Cervicalgia     Problem List Patient Active Problem List   Diagnosis Date Noted   Family history of breast cancer 03/18/2020  Other insomnia 03/18/2020   Class 2 severe obesity due to excess calories with serious comorbidity in adult [facility]) 03/18/2020   Prediabetes 07/20/2019   GERD (gastroesophageal reflux disease) 04/05/2015   Vitamin D deficiency 11/23/2014   Bilateral hand numbness 11/20/2014   Subcutaneous nodule of chest wall 11/20/2014   Left shoulder pain 11/20/2014   Menorrhagia 09/03/2014   Patellofemoral arthritis of right knee 08/19/2014   Right knee pain 05/22/2014   Right ankle pain 04/17/2014   Plantar fasciitis,  bilateral 03/27/2014   Achilles tendonitis 03/27/2014   HLD (hyperlipidemia) 03/06/2014   Current smoker 03/04/2014   Hypertension    Zebedee Iba PT, DPT 11/23/20 5:03 PM   [facility] Physical Therapy [address] [place], [place], [address] Phone: [phone]   Fax:  [phone]  Name: Kristi Cisneros MRN: [id number] Date of Birth: 05-11-1966

## 2020-12-08 ENCOUNTER — Other Ambulatory Visit: Payer: Self-pay

## 2020-12-08 ENCOUNTER — Encounter: Payer: Self-pay | Admitting: Rehabilitative and Restorative Service Providers"

## 2020-12-08 ENCOUNTER — Ambulatory Visit (INDEPENDENT_AMBULATORY_CARE_PROVIDER_SITE_OTHER): Payer: 59 | Admitting: Rehabilitative and Restorative Service Providers"

## 2020-12-08 DIAGNOSIS — M6281 Muscle weakness (generalized): Secondary | ICD-10-CM

## 2020-12-08 DIAGNOSIS — M542 Cervicalgia: Secondary | ICD-10-CM

## 2020-12-08 DIAGNOSIS — M25512 Pain in left shoulder: Secondary | ICD-10-CM | POA: Diagnosis not present

## 2020-12-08 DIAGNOSIS — G8929 Other chronic pain: Secondary | ICD-10-CM

## 2020-12-08 NOTE — Patient Instructions (Signed)
Access Code: [email or web address] URL: [email or web address] Date: 12/08/2020 Prepared by: Chyrel Masson  Exercises Isometric Shoulder External Rotation - 2 x daily - 7 x weekly - 3 sets - 10 reps Seated Upper Trapezius Stretch (Mirrored) - 2 x daily - 7 x weekly - 1 sets - 5 reps - 15 hold Gentle Levator Scapulae Stretch - 2 x daily - 7 x weekly - 1 sets - 5 reps - 15 hold Seated Scapular Retraction - 2 x daily - 7 x weekly - 10 reps - 2 sets - 5 hold Standing Shoulder Posterior Capsule Stretch - 2 x daily - 7 x weekly - 1 sets - 5 reps - 15 hold  Patient Education Trigger Point Dry Needling

## 2020-12-08 NOTE — Therapy (Signed)
[facility] Therapy [address] [place], [place], [address] Phone: [phone]   Fax:  [phone]  Physical Therapy Treatment  Patient Details  Name: Kristi Cisneros MRN: [id number] Date of Birth: [birth year] Referring Provider (PT): Mcarthur Rossetti, MD   Encounter Date: 12/08/2020   PT End of Session - 12/08/20 1427     Visit Number 2    Number of Visits 17    Date for PT Re-Evaluation 02/21/21    Authorization Type [employer]    Progress Note Due on Visit 10    PT Start Time 1430    PT Stop Time 1510    PT Time Calculation (min) 40 min    Activity Tolerance Patient tolerated treatment well    Behavior During Therapy [facility] for tasks assessed/performed             Past Medical History:  Diagnosis Date   Arthritis    GERD (gastroesophageal reflux disease)    Hypertension Dx 2015    Past Surgical History:  Procedure Laterality Date   KNEE ARTHROSCOPY Right 01/26/2015   Procedure: ARTHROSCOPY KNEE, DEBRIDEMENT;  Surgeon: Meredith Pel, MD;  Location: [place];  Service: Orthopedics;  Laterality: Right;   NO PAST SURGERIES      There were no vitals filed for this visit.   Subjective Assessment - 12/08/20 1435     Subjective Pt. indicated feeling about the same with symptoms, reported 6/10 pain in Lt shoulder and upper trap region.  Pt. stated symptoms "were really bothering her this morning."    Diagnostic tests X-ray c/s decreased lordosis, L shoulder AC joint OA    Patient Stated Goals Pt states she would like to avoid surgery.    Currently in Pain? Yes    Pain Location Shoulder    Pain Orientation Left    Pain Descriptors / Indicators Aching;Sore;Tightness    Pain Type Chronic pain    Pain Onset More than a month ago    Pain Frequency Constant    Aggravating Factors  work, constant symptom    Pain Relieving Factors nothing specific indicated                [facility] PT Assessment - 12/08/20 0001        Palpation   Palpation comment concordant shoulder symptoms from Trigger points in Lt infraspinatus, Lt upper trap today                           [facility] Adult PT Treatment/Exercise - 12/08/20 0001       Shoulder Exercises: Seated   Retraction Both;10 reps   5 sec hold     Shoulder Exercises: Stretch   Other Shoulder Stretches Lt upper trap stretch 15 sec x 5, posterior capsule cross arm stretch 15 sec x 5 Lt      Modalities   Modalities Moist Heat      Moist Heat Therapy   Number Minutes Moist Heat 5 Minutes   c ther ex   Moist Heat Location Other (comment)   Lt upper trap, posterior shoulder     Manual Therapy   Manual therapy comments compression to Lt upper trap, Lt infraspinatus, skilled palpation during DN.              Trigger Point Dry Needling - 12/08/20 0001     Consent Given? Yes    Education Handout Provided Yes    Muscles Treated Head  and Neck Upper trapezius   Lt   Muscles Treated Upper Quadrant Infraspinatus   Lt   Upper Trapezius Response Twitch reponse elicited    Infraspinatus Response Twitch response elicited                  PT Education - 12/08/20 1455     Education Details DN, HEP progression    Person(s) Educated Patient    Methods Explanation;Demonstration;Verbal cues;Handout    Comprehension Verbalized understanding;Returned demonstration              PT Short Term Goals - 12/08/20 1455       PT SHORT TERM GOAL #1   Title Pt will become independent with HEP in order to demonstrate synthesis of PT education.    Time 2    Period Weeks    Status Achieved      PT SHORT TERM GOAL #2   Title Pt will be able to demonstrate BHB and OH reaching without pain in order to demonstrate functional improvement in UEfunction for self-care and house hold duties.    Time 4    Period Weeks    Status Not Met      PT SHORT TERM GOAL #3   Title Pt will be able to reach [facility] and grab/reach/hold >1 lbs in order to demonstrate  functional improvement in L UE function.    Time 4    Period Weeks    Status Partially Met               PT Long Term Goals - 11/23/20 1509       PT LONG TERM GOAL #1   Title Pt will become independent with final HEP in order to demonstrate synthesis of PT education.    Time 2    Period Weeks    Status New      PT LONG TERM GOAL #2   Title Pt will score >/= 71 on FOTO to demonstrate functional improvement in L shoulder and neck pain.    Time 8    Period Weeks    Status New      PT LONG TERM GOAL #3   Title Pt will demonstrate/report ability sleep on L side without pain in order to demonstrate functional improvement and tolerance to static positioning.    Baseline Pt will be able to reach [facility] and carry/reach/hold >10 lbs in order to demonstrate functional improvement in L UE function for return to normal occupational duties.    Time 8    Period Weeks    Status New      PT LONG TERM GOAL #4   Title Pt will be able to demonstrate and report ability to perform sustained OH reaching without pain or NT in order to demonstrate functional improvement in UE function foroccupational and house hold duties.                   Plan - 12/08/20 1453     Clinical Impression Statement Strong concordant symptoms noted from trigger points and twitch reponse in Lt infraspinatus and Lt upper trap at this time.  Mobility defiicts also noted in Lt shoulder movement at this time.  Treatment today to lower severity of myofascial pain to allow further progression.    Personal Factors and Comorbidities Time since onset of injury/illness/exacerbation    Examination-Activity Limitations Lift;Carry;Other;Reach Overhead    Examination-Participation Restrictions Occupation;Community Activity;Laundry;Cleaning    Stability/Clinical Decision Making Stable/Uncomplicated    Rehab Potential Good  PT Frequency 2x / week    PT Duration 8 weeks    PT Treatment/Interventions ADLs/Self Care Home  Management;Iontophoresis 92m/ml Dexamethasone;Moist Heat;Ultrasound;Traction;Aquatic Therapy;Electrical Stimulation;Cryotherapy;Functional mobility training;Therapeutic activities;Therapeutic exercise;Neuromuscular re-education;Patient/family education;Manual techniques;Passive range of motion;Dry needling;Vasopneumatic Device;Joint Manipulations;Spinal Manipulations    PT Next Visit Plan Dry Needling if desired, continue Lt shoulder mobility gains, strengthening as tolerated.    PT Home Exercise Plan Access Code: [email or web address]    UVQQUIVHOand Agree with Plan of Care Patient             Patient will benefit from skilled therapeutic intervention in order to improve the following deficits and impairments:  Improper body mechanics, Pain, Postural dysfunction, Impaired flexibility, Impaired UE functional use, Decreased mobility, Increased muscle spasms, Decreased range of motion, Decreased strength, Hypomobility  Visit Diagnosis: Chronic left shoulder pain  Muscle weakness (generalized)  Cervicalgia     Problem List Patient Active Problem List   Diagnosis Date Noted   Family history of breast cancer 03/18/2020   Other insomnia 03/18/2020   Class 2 severe obesity due to excess calories with serious comorbidity in adult [facility] 03/18/2020   Prediabetes 07/20/2019   GERD (gastroesophageal reflux disease) 04/05/2015   Vitamin D deficiency 11/23/2014   Bilateral hand numbness 11/20/2014   Subcutaneous nodule of chest wall 11/20/2014   Left shoulder pain 11/20/2014   Menorrhagia 09/03/2014   Patellofemoral arthritis of right knee 08/19/2014   Right knee pain 05/22/2014   Right ankle pain 04/17/2014   Plantar fasciitis, bilateral 03/27/2014   Achilles tendonitis 03/27/2014   HLD (hyperlipidemia) 03/06/2014   Current smoker 03/04/2014   Hypertension    MScot Jun PT, DPT, OCS, ATC 12/08/20  3:04 PM    [facility] [address]  [address]Phone: [phone]  Fax:  [phone] Name: Kristi BICKLEYMRN: [id number] of Birth: 803/31/1967

## 2020-12-09 ENCOUNTER — Encounter: Payer: Self-pay | Admitting: Rehabilitative and Restorative Service Providers"

## 2020-12-09 ENCOUNTER — Ambulatory Visit (INDEPENDENT_AMBULATORY_CARE_PROVIDER_SITE_OTHER): Payer: 59 | Admitting: Rehabilitative and Restorative Service Providers"

## 2020-12-09 DIAGNOSIS — M25512 Pain in left shoulder: Secondary | ICD-10-CM

## 2020-12-09 DIAGNOSIS — M6281 Muscle weakness (generalized): Secondary | ICD-10-CM | POA: Diagnosis not present

## 2020-12-09 DIAGNOSIS — M542 Cervicalgia: Secondary | ICD-10-CM

## 2020-12-09 DIAGNOSIS — G8929 Other chronic pain: Secondary | ICD-10-CM

## 2020-12-09 NOTE — Therapy (Signed)
[facility] Physical Therapy [address] [place], [place], [address] Phone: [phone]   Fax:  [phone]  Physical Therapy Treatment  Patient Details  Name: Kristi Cisneros MRN: [id number] Date of Birth: 03/19/1966 Referring Provider (PT): Mcarthur Rossetti, MD   Encounter Date: 12/09/2020   PT End of Session - 12/09/20 1539     Visit Number 3    Number of Visits 17    Date for PT Re-Evaluation 02/21/21    Authorization Type [employer]    Progress Note Due on Visit 10    PT Start Time 1515    PT Stop Time 1554    PT Time Calculation (min) 39 min    Activity Tolerance Patient tolerated treatment well    Behavior During Therapy [facility] for tasks assessed/performed             Past Medical History:  Diagnosis Date   Arthritis    GERD (gastroesophageal reflux disease)    Hypertension Dx 2015    Past Surgical History:  Procedure Laterality Date   KNEE ARTHROSCOPY Right 01/26/2015   Procedure: ARTHROSCOPY KNEE, DEBRIDEMENT;  Surgeon: Meredith Pel, MD;  Location: [place];  Service: Orthopedics;  Laterality: Right;   NO PAST SURGERIES      There were no vitals filed for this visit.   Subjective Assessment - 12/09/20 1518     Subjective Pt. stated feeling a little improvement since yesterday.  No pain at rest, still having some soreness from last visit.  Felt like neck was doing better from last visit.    Diagnostic tests X-ray c/s decreased lordosis, L shoulder AC joint OA    Patient Stated Goals Pt states she would like to avoid surgery.    Currently in Pain? No/denies    Pain Onset More than a month ago                               [facility] Adult PT Treatment/Exercise - 12/09/20 0001       Shoulder Exercises: Sidelying   External Rotation Left   towel at side 3 x 10   External Rotation Weight (lbs) 2    ABduction Left   2 x 10     Shoulder Exercises: Standing   Extension Both   3 x 10   Theraband  Level (Shoulder Extension) Level 3 (Green)    Row Theraband   3 x 10   Theraband Level (Shoulder Row) Level 3 (Green)      Shoulder Exercises: ROM/Strengthening   UBE (Upper Arm Bike) Lvl 2 3 mins fwd/back each way      Shoulder Exercises: Stretch   Other Shoulder Stretches cross arm stretch in supine 15 sec x 5, sleeper stretch 15 sec x 5      Manual Therapy   Manual therapy comments compression c movement (ER)to Lt infraspinatus                      PT Short Term Goals - 12/08/20 1455       PT SHORT TERM GOAL #1   Title Pt will become independent with HEP in order to demonstrate synthesis of PT education.    Time 2    Period Weeks    Status Achieved      PT SHORT TERM GOAL #2   Title Pt will be able to demonstrate BHB and OH reaching without pain in  order to demonstrate functional improvement in UEfunction for self-care and house hold duties.    Time 4    Period Weeks    Status Not Met      PT SHORT TERM GOAL #3   Title Pt will be able to reach [facility] and grab/reach/hold >1 lbs in order to demonstrate functional improvement in L UE function.    Time 4    Period Weeks    Status Partially Met               PT Long Term Goals - 11/23/20 1509       PT LONG TERM GOAL #1   Title Pt will become independent with final HEP in order to demonstrate synthesis of PT education.    Time 2    Period Weeks    Status New      PT LONG TERM GOAL #2   Title Pt will score >/= 71 on FOTO to demonstrate functional improvement in L shoulder and neck pain.    Time 8    Period Weeks    Status New      PT LONG TERM GOAL #3   Title Pt will demonstrate/report ability sleep on L side without pain in order to demonstrate functional improvement and tolerance to static positioning.    Baseline Pt will be able to reach [facility] and carry/reach/hold >10 lbs in order to demonstrate functional improvement in L UE function for return to normal occupational duties.    Time 8    Period Weeks     Status New      PT LONG TERM GOAL #4   Title Pt will be able to demonstrate and report ability to perform sustained OH reaching without pain or NT in order to demonstrate functional improvement in UE function foroccupational and house hold duties.                   Plan - 12/09/20 1526     Clinical Impression Statement Initial response of improvement in neck symptoms from last visit trigger point release.  Continued infraspinatus work today (compression only, no DN) to help improve mobility for IR and reduce symptoms.  Passive mobility into flexion, abduction and ER relatively WFL withot symptoms.  Elevation above shoulder height in flexion produced pain complaints, with some reduction after treatment.    Personal Factors and Comorbidities Time since onset of injury/illness/exacerbation    Examination-Activity Limitations Lift;Carry;Other;Reach Overhead    Examination-Participation Restrictions Occupation;Community Activity;Laundry;Cleaning    Stability/Clinical Decision Making Stable/Uncomplicated    Rehab Potential Good    PT Frequency 2x / week    PT Duration 8 weeks    PT Treatment/Interventions ADLs/Self Care Home Management;Iontophoresis 4mg /ml Dexamethasone;Moist Heat;Ultrasound;Traction;Aquatic Therapy;Electrical Stimulation;Cryotherapy;Functional mobility training;Therapeutic activities;Therapeutic exercise;Neuromuscular re-education;Patient/family education;Manual techniques;Passive range of motion;Dry needling;Vasopneumatic Device;Joint Manipulations;Spinal Manipulations    PT Next Visit Plan Dry Needling if desired, progressive posterior capsule and scapular strengthening as tolerated.    PT Home Exercise Plan Access Code: [email or web address]    Consulted and Agree with Plan of Care Patient             Patient will benefit from skilled therapeutic intervention in order to improve the following deficits and impairments:  Improper body mechanics, Pain, Postural dysfunction,  Impaired flexibility, Impaired UE functional use, Decreased mobility, Increased muscle spasms, Decreased range of motion, Decreased strength, Hypomobility  Visit Diagnosis: Chronic left shoulder pain  Muscle weakness (generalized)  Cervicalgia     Problem List Patient Active Problem  List   Diagnosis Date Noted   Family history of breast cancer 03/18/2020   Other insomnia 03/18/2020   Class 2 severe obesity due to excess calories with serious comorbidity in adult [facility]) 03/18/2020   Prediabetes 07/20/2019   GERD (gastroesophageal reflux disease) 04/05/2015   Vitamin D deficiency 11/23/2014   Bilateral hand numbness 11/20/2014   Subcutaneous nodule of chest wall 11/20/2014   Left shoulder pain 11/20/2014   Menorrhagia 09/03/2014   Patellofemoral arthritis of right knee 08/19/2014   Right knee pain 05/22/2014   Right ankle pain 04/17/2014   Plantar fasciitis, bilateral 03/27/2014   Achilles tendonitis 03/27/2014   HLD (hyperlipidemia) 03/06/2014   Current smoker 03/04/2014   Hypertension    Scot Jun, PT, DPT, OCS, ATC 12/09/20  3:45 PM    [facility] [address] [place], [place], [address] Phone: [phone]   Fax:  [phone]  Name: Kristi Cisneros MRN: [id number] Date of Birth: [birth year]

## 2020-12-14 ENCOUNTER — Encounter: Payer: Self-pay | Admitting: Physical Therapy

## 2020-12-14 ENCOUNTER — Ambulatory Visit (INDEPENDENT_AMBULATORY_CARE_PROVIDER_SITE_OTHER): Payer: 59 | Admitting: Physical Therapy

## 2020-12-14 ENCOUNTER — Other Ambulatory Visit: Payer: Self-pay

## 2020-12-14 DIAGNOSIS — G8929 Other chronic pain: Secondary | ICD-10-CM | POA: Diagnosis not present

## 2020-12-14 DIAGNOSIS — M6281 Muscle weakness (generalized): Secondary | ICD-10-CM | POA: Diagnosis not present

## 2020-12-14 DIAGNOSIS — M542 Cervicalgia: Secondary | ICD-10-CM

## 2020-12-14 DIAGNOSIS — M25512 Pain in left shoulder: Secondary | ICD-10-CM | POA: Diagnosis not present

## 2020-12-14 NOTE — Patient Instructions (Signed)
Access Code: [email or web address] URL: [email or web address] Date: 12/14/2020 Prepared by: Zebedee Iba  Exercises Isometric Shoulder External Rotation - 2 x daily - 7 x weekly - 3 sets - 10 reps Seated Upper Trapezius Stretch (Mirrored) - 2 x daily - 7 x weekly - 1 sets - 5 reps - 15 hold Gentle Levator Scapulae Stretch - 2 x daily - 7 x weekly - 1 sets - 5 reps - 15 hold Seated Scapular Retraction - 2 x daily - 7 x weekly - 10 reps - 2 sets - 5 hold Standing Shoulder Posterior Capsule Stretch - 2 x daily - 7 x weekly - 1 sets - 5 reps - 15 hold Shoulder External Rotation and Scapular Retraction with Resistance - 2 x daily - 7 x weekly - 2 sets - 10 reps Single Arm Doorway Pec Stretch at 60 Elevation - 2 x daily - 7 x weekly - 1 sets - 3 reps - 30 hold

## 2020-12-14 NOTE — Therapy (Addendum)
[facility] Physical Therapy [address] [place], [place], [address] Phone: [phone]   Fax:  [phone]  Physical Therapy Treatment /Discharge  Patient Details  Name: Kristi Cisneros MRN: [id number] Date of Birth: 06/27/1965 Referring Provider (PT): Mcarthur Rossetti, MD   Encounter Date: 12/14/2020   PT End of Session - 12/14/20 1559     Visit Number 4    Number of Visits 17    Date for PT Re-Evaluation 02/21/21    Authorization Type [employer]    Progress Note Due on Visit 10    PT Start Time 1559    PT Stop Time 1640    PT Time Calculation (min) 41 min    Activity Tolerance Patient tolerated treatment well    Behavior During Therapy [facility] for tasks assessed/performed             Past Medical History:  Diagnosis Date   Arthritis    GERD (gastroesophageal reflux disease)    Hypertension Dx 2015    Past Surgical History:  Procedure Laterality Date   KNEE ARTHROSCOPY Right 01/26/2015   Procedure: ARTHROSCOPY KNEE, DEBRIDEMENT;  Surgeon: Meredith Pel, MD;  Location: [facility];  Service: Orthopedics;  Laterality: Right;   NO PAST SURGERIES      There were no vitals filed for this visit.   Subjective Assessment - 12/14/20 1559     Subjective Pt states she was doing well until yesterday. Pt's hand slipped while supporting herself on the edge of a tub and the L hand slipped forward. She has had increased soreness ever since.    Diagnostic tests X-ray c/s decreased lordosis, L shoulder AC joint OA    Patient Stated Goals Pt states she would like to avoid surgery.    Currently in Pain? Yes    Pain Score 5     Pain Location Shoulder    Pain Orientation Left;Anterior    Pain Descriptors / Indicators Sore;Other (Comment)   pinch   Pain Onset More than a month ago                               [facility] Adult PT Treatment/Exercise - 12/14/20 0001                Shoulder Exercises: Prone   Other Prone  Exercises ER GTB 2x10   bilat                      Shoulder Exercises: Standing   Extension Both   3 x 10   Theraband Level (Shoulder Extension) Level 3 (Green)    Row Theraband   3 x 10   Theraband Level (Shoulder Row) Level 3 (Green)    Other Standing Exercises Cane ER 10s 10x              Shoulder Exercises: Stretch   Other Shoulder Stretches cross arm stretch in supine 15 sec x 5    Other Shoulder Stretches doorway pec stretch 30s 3x; UT stretch 30s 3x      Manual Therapy   Manual therapy comments STM pec minor, UT, infra, deltoid, supra                    PT Education - 12/14/20 1631     Education Details anatomy, exercise progression, DOMS expectations, muscle firing, HEP    Person(s) Educated Patient    Methods Explanation;Demonstration;Tactile  cues;Verbal cues;Handout    Comprehension Verbalized understanding;Returned demonstration;Verbal cues required;Tactile cues required              PT Short Term Goals - 12/08/20 1455       PT SHORT TERM GOAL #1   Title Pt will become independent with HEP in order to demonstrate synthesis of PT education.    Time 2    Period Weeks    Status Achieved      PT SHORT TERM GOAL #2   Title Pt will be able to demonstrate BHB and OH reaching without pain in order to demonstrate functional improvement in UEfunction for self-care and house hold duties.    Time 4    Period Weeks    Status Not Met      PT SHORT TERM GOAL #3   Title Pt will be able to reach [facility] and grab/reach/hold >1 lbs in order to demonstrate functional improvement in L UE function.    Time 4    Period Weeks    Status Partially Met               PT Long Term Goals - 11/23/20 1509       PT LONG TERM GOAL #1   Title Pt will become independent with final HEP in order to demonstrate synthesis of PT education.    Time 2    Period Weeks    Status New      PT LONG TERM GOAL #2   Title Pt will score >/= 71 on FOTO to demonstrate  functional improvement in L shoulder and neck pain.    Time 8    Period Weeks    Status New      PT LONG TERM GOAL #3   Title Pt will demonstrate/report ability sleep on L side without pain in order to demonstrate functional improvement and tolerance to static positioning.    Baseline Pt will be able to reach [facility] and carry/reach/hold >10 lbs in order to demonstrate functional improvement in L UE function for return to normal occupational duties.    Time 8    Period Weeks    Status New      PT LONG TERM GOAL #4   Title Pt will be able to demonstrate and report ability to perform sustained OH reaching without pain or NT in order to demonstrate functional improvement in UE function foroccupational and house hold duties.                   Plan - 12/14/20 1632     Clinical Impression Statement Pt with increased pec minor and L posterior shoulder soft tissue stiffness at today's session due to recent work related [place]. Injury appears to soft tissue related, no wrist related injuries noted. Pt was able to progress L shoulder stretching and strengthening HEP. Pt required VC and TC for technique and scapular positioning due to tendency for UT compensation. Pt reported reduction of pain following session. Pt would benefit from continued skilled therapy in order to reach goals and maximize functional L UE strength and ROM for full return to PLOF.    Personal Factors and Comorbidities Time since onset of injury/illness/exacerbation    Examination-Activity Limitations Lift;Carry;Other;Reach Overhead    Examination-Participation Restrictions Occupation;Community Activity;Laundry;Cleaning    Stability/Clinical Decision Making Stable/Uncomplicated    Rehab Potential Good    PT Frequency 2x / week    PT Duration 8 weeks    PT Treatment/Interventions ADLs/Self Care Home Management;Iontophoresis  74m/ml Dexamethasone;Moist Heat;Ultrasound;Traction;Aquatic Therapy;Electrical  Stimulation;Cryotherapy;Functional mobility training;Therapeutic activities;Therapeutic exercise;Neuromuscular re-education;Patient/family education;Manual techniques;Passive range of motion;Dry needling;Vasopneumatic Device;Joint Manipulations;Spinal Manipulations    PT Next Visit Plan Dry Needling if desired, progressive posterior capsule and scapular strengthening as tolerated.    PT Home Exercise Plan Access Code: [email or web address]    [email or web address] Agree with Plan of Care Patient             Patient will benefit from skilled therapeutic intervention in order to improve the following deficits and impairments:  Improper body mechanics, Pain, Postural dysfunction, Impaired flexibility, Impaired UE functional use, Decreased mobility, Increased muscle spasms, Decreased range of motion, Decreased strength, Hypomobility  Visit Diagnosis: Chronic left shoulder pain  Muscle weakness (generalized)  Cervicalgia     Problem List Patient Active Problem List   Diagnosis Date Noted   Family history of breast cancer 03/18/2020   Other insomnia 03/18/2020   Class 2 severe obesity due to excess calories with serious comorbidity in adult ([facility] 03/18/2020   Prediabetes 07/20/2019   GERD (gastroesophageal reflux disease) 04/05/2015   Vitamin D deficiency 11/23/2014   Bilateral hand numbness 11/20/2014   Subcutaneous nodule of chest wall 11/20/2014   Left shoulder pain 11/20/2014   Menorrhagia 09/03/2014   Patellofemoral arthritis of right knee 08/19/2014   Right knee pain 05/22/2014   Right ankle pain 04/17/2014   Plantar fasciitis, bilateral 03/27/2014   Achilles tendonitis 03/27/2014   HLD (hyperlipidemia) 03/06/2014   Current smoker 03/04/2014   Hypertension     ADaleen BoPT, DPT 12/14/20 4:45 PM  PHYSICAL THERAPY DISCHARGE SUMMARY  Visits from Start of Care: 4  Current functional level related to goals / functional outcomes: See note   Remaining deficits: See note   Education /  Equipment: HEP   Patient agrees to discharge. Patient goals were partially met. Patient is being discharged due to lack of progress.  MScot Jun PT, DPT, OCS, ATC 01/24/21  12:00 PM      [facility] [address] [address]Phone: [phone]  Fax:  [phone] Name: Kristi ABEYTAMRN: [id number] of Birth: [birth year]

## 2020-12-16 ENCOUNTER — Encounter: Payer: 59 | Admitting: Physical Therapy

## 2020-12-17 ENCOUNTER — Other Ambulatory Visit: Payer: Self-pay

## 2020-12-17 ENCOUNTER — Other Ambulatory Visit: Payer: Self-pay | Admitting: Physician Assistant

## 2020-12-17 DIAGNOSIS — I10 Essential (primary) hypertension: Secondary | ICD-10-CM

## 2020-12-17 MED ORDER — LISINOPRIL-HYDROCHLOROTHIAZIDE 20-25 MG PO TABS
1.0000 | ORAL_TABLET | Freq: Every day | ORAL | 0 refills | Status: DC
  Filled 2020-12-17: qty 30, 30d supply, fill #0

## 2020-12-20 ENCOUNTER — Other Ambulatory Visit: Payer: Self-pay

## 2020-12-20 ENCOUNTER — Ambulatory Visit: Payer: 59 | Admitting: Orthopaedic Surgery

## 2020-12-21 ENCOUNTER — Ambulatory Visit: Payer: Self-pay | Admitting: *Deleted

## 2020-12-21 ENCOUNTER — Encounter: Payer: 59 | Admitting: Physical Therapy

## 2020-12-21 ENCOUNTER — Other Ambulatory Visit: Payer: Self-pay

## 2020-12-21 NOTE — Telephone Encounter (Signed)
Pt called and is requesting to have her BP medication refilled. She states that she does not know the name and does not have access to bottles at the moment. Please advise.  Reason for Disposition  Caller with prescription and triager answers question  Answer Assessment - Initial Assessment Questions 1. DRUG NAME: "What medicine do you need to have refilled?"     Lisinopril  2. REFILLS REMAINING: "How many refills are remaining?" (Note: The label on the medicine or pill bottle will show how many refills are remaining. If there are no refills remaining, then a renewal may be needed.)     none 3. EXPIRATION DATE: "What is the expiration date?" (Note: The label states when the prescription will expire, and thus can no longer be refilled.)     N/a 4. PRESCRIBING HCP: "Who prescribed it?" Reason: If prescribed by specialist, call should be referred to that group.     PCP  Patient is calling to verify that her BP medication was filled. Patient has appointment 8/30. Advised Rx should be at pharmacy- filled 8/5.  Protocols used: Medication Refill and Renewal Call-A-AH

## 2020-12-23 ENCOUNTER — Encounter: Payer: 59 | Admitting: Physical Therapy

## 2020-12-23 ENCOUNTER — Telehealth: Payer: Self-pay | Admitting: Physical Therapy

## 2020-12-23 NOTE — Telephone Encounter (Signed)
Pt no show for PT appointment today. They were contacted and informed of this via voicemail. They were provided the date and time of their next appointment on voicemail. They were instructed to call us to let us know if they cannot make their appointment.   Ivery Quale, PT, DPT 12/23/20 3:34 PM

## 2020-12-28 ENCOUNTER — Encounter: Payer: 59 | Admitting: Physical Therapy

## 2020-12-30 ENCOUNTER — Encounter: Payer: 59 | Admitting: Physical Therapy

## 2021-01-10 ENCOUNTER — Ambulatory Visit (INDEPENDENT_AMBULATORY_CARE_PROVIDER_SITE_OTHER): Payer: 59 | Admitting: Physician Assistant

## 2021-01-10 ENCOUNTER — Ambulatory Visit (INDEPENDENT_AMBULATORY_CARE_PROVIDER_SITE_OTHER): Payer: 59

## 2021-01-10 ENCOUNTER — Encounter: Payer: Self-pay | Admitting: Physician Assistant

## 2021-01-10 DIAGNOSIS — M25512 Pain in left shoulder: Secondary | ICD-10-CM | POA: Diagnosis not present

## 2021-01-10 DIAGNOSIS — G8929 Other chronic pain: Secondary | ICD-10-CM | POA: Diagnosis not present

## 2021-01-10 NOTE — Progress Notes (Signed)
Office Visit Note   Patient: Kristi Cisneros           Date of Birth: 1965/06/25           MRN: [id number] Visit Date: 01/10/2021              Requested by: Marcine Matar, MD [address] [place],  [place] [address] PCP: Marcine Matar, MD   Assessment & Plan: Visit Diagnoses:  1. Chronic left shoulder pain     Plan: Due to patient's continued left shoulder pain despite conservative measures which included injections, physical therapy and time recommend MRI of the left shoulder rule out rotator cuff tear.  We will have her back after the MRI to go over results and discuss further treatment.  Questions were encouraged and answered.  Follow-Up Instructions: Return After MRI.   Orders:  Orders Placed This Encounter  Procedures   XR Shoulder Left   No orders of the defined types were placed in this encounter.     Procedures: No procedures performed   Clinical Data: No additional findings.   Subjective: Chief Complaint  Patient presents with   Left Shoulder - Pain, Follow-up    HPI Kristi Cisneros returns today for follow-up of her left shoulder.  She states the cortisone injection she had it gave her minimal relief if any.  She went to formal therapy and got minimal relief.  Couple weeks ago she was at work and was cleaning the tub and slipped falling on an outstretched left arm has had increased shoulder pain slightly worse than it was prior to the injection.  She been taking Advil for pain.  She states this only takes the edge off. Review of Systems See HPI  Objective: Vital Signs: There were no vitals taken for this visit.  Physical Exam General well-developed well-nourished female no acute distress mood and affect appropriate Psych: Alert and oriented x3 Ortho Exam Bilateral shoulders: 5/5 strength with external and internal rotation against resistance.  She has weakness with liftoff test on the left.  Abduction of the left arm across chest also causes  pain in the shoulder girdle region.  Impingement testing negative bilaterally. Specialty Comments:  No specialty comments available.  Imaging: XR Shoulder Left  Result Date: 01/10/2021 Left shoulder 3 views: Significant AC joint arthritis.  Otherwise humeral heads well located.  Glenohumeral joints well-maintained.  Subacromial space well-maintained.  No acute fractures.  No subluxation dislocation left shoulder.    PMFS History: Patient Active Problem List   Diagnosis Date Noted   Family history of breast cancer 03/18/2020   Other insomnia 03/18/2020   Class 2 severe obesity due to excess calories with serious comorbidity in adult [facility]) 03/18/2020   Prediabetes 07/20/2019   GERD (gastroesophageal reflux disease) 04/05/2015   Vitamin D deficiency 11/23/2014   Bilateral hand numbness 11/20/2014   Subcutaneous nodule of chest wall 11/20/2014   Left shoulder pain 11/20/2014   Menorrhagia 09/03/2014   Patellofemoral arthritis of right knee 08/19/2014   Right knee pain 05/22/2014   Right ankle pain 04/17/2014   Plantar fasciitis, bilateral 03/27/2014   Achilles tendonitis 03/27/2014   HLD (hyperlipidemia) 03/06/2014   Current smoker 03/04/2014   Hypertension    Past Medical History:  Diagnosis Date   Arthritis    GERD (gastroesophageal reflux disease)    Hypertension Dx 2015    Family History  Problem Relation Age of Onset   Hypertension Mother    Diabetes Mother  Hypertension Brother    Breast cancer Sister    Breast cancer Maternal Aunt     Past Surgical History:  Procedure Laterality Date   KNEE ARTHROSCOPY Right 01/26/2015   Procedure: ARTHROSCOPY KNEE, DEBRIDEMENT;  Surgeon: Cammy Copa, MD;  Location: [facility] OR;  Service: Orthopedics;  Laterality: Right;   NO PAST SURGERIES     Social History   Occupational History   Occupation: Building services engineer: [employer]     Comment: Public librarian   Tobacco Use   Smoking status: Every Day     Packs/day: 0.50    Years: 20.00    Pack years: 10.00    Types: Cigarettes   Smokeless tobacco: Never  Substance and Sexual Activity   Alcohol use: No    Comment: socially- ocassional   Drug use: No   Sexual activity: Yes    Birth control/protection: None

## 2021-01-11 ENCOUNTER — Ambulatory Visit: Payer: 59 | Attending: Internal Medicine | Admitting: Internal Medicine

## 2021-01-11 ENCOUNTER — Encounter: Payer: Self-pay | Admitting: Internal Medicine

## 2021-01-11 ENCOUNTER — Other Ambulatory Visit: Payer: Self-pay

## 2021-01-11 VITALS — BP 130/83 | HR 103 | Resp 16 | Wt 231.2 lb

## 2021-01-11 DIAGNOSIS — Z23 Encounter for immunization: Secondary | ICD-10-CM

## 2021-01-11 DIAGNOSIS — I10 Essential (primary) hypertension: Secondary | ICD-10-CM | POA: Diagnosis not present

## 2021-01-11 DIAGNOSIS — F17200 Nicotine dependence, unspecified, uncomplicated: Secondary | ICD-10-CM

## 2021-01-11 DIAGNOSIS — E7800 Pure hypercholesterolemia, unspecified: Secondary | ICD-10-CM

## 2021-01-11 DIAGNOSIS — Z6836 Body mass index (BMI) 36.0-36.9, adult: Secondary | ICD-10-CM

## 2021-01-11 DIAGNOSIS — Z2821 Immunization not carried out because of patient refusal: Secondary | ICD-10-CM

## 2021-01-11 DIAGNOSIS — Z1211 Encounter for screening for malignant neoplasm of colon: Secondary | ICD-10-CM

## 2021-01-11 DIAGNOSIS — K219 Gastro-esophageal reflux disease without esophagitis: Secondary | ICD-10-CM

## 2021-01-11 DIAGNOSIS — G4709 Other insomnia: Secondary | ICD-10-CM

## 2021-01-11 DIAGNOSIS — Z803 Family history of malignant neoplasm of breast: Secondary | ICD-10-CM

## 2021-01-11 MED ORDER — TRAZODONE HCL 50 MG PO TABS
ORAL_TABLET | ORAL | 3 refills | Status: DC
  Filled 2021-01-11 – 2021-05-20 (×2): qty 30, 30d supply, fill #0
  Filled 2021-05-20: qty 30, 30d supply, fill #1
  Filled 2021-05-31: qty 30, 30d supply, fill #0
  Filled 2021-12-17 – 2022-01-02 (×2): qty 30, 30d supply, fill #1

## 2021-01-11 MED ORDER — PANTOPRAZOLE SODIUM 40 MG PO TBEC
40.0000 mg | DELAYED_RELEASE_TABLET | Freq: Every day | ORAL | 1 refills | Status: DC
Start: 2021-01-11 — End: 2021-08-31
  Filled 2021-01-11 – 2021-05-20 (×3): qty 90, 90d supply, fill #0
  Filled 2021-05-20: qty 90, 90d supply, fill #1
  Filled 2021-05-31: qty 90, 90d supply, fill #0

## 2021-01-11 MED ORDER — LISINOPRIL-HYDROCHLOROTHIAZIDE 20-25 MG PO TABS
1.0000 | ORAL_TABLET | Freq: Every day | ORAL | 0 refills | Status: DC
  Filled 2021-01-11: qty 30, 30d supply, fill #0

## 2021-01-11 MED ORDER — ATORVASTATIN CALCIUM 40 MG PO TABS
60.0000 mg | ORAL_TABLET | Freq: Every day | ORAL | 1 refills | Status: DC
  Filled 2021-01-11 – 2021-05-20 (×3): qty 135, 90d supply, fill #0
  Filled 2021-05-20: qty 135, 90d supply, fill #1
  Filled 2021-05-31: qty 135, 90d supply, fill #0

## 2021-01-11 MED ORDER — ATORVASTATIN CALCIUM 40 MG PO TABS
60.0000 mg | ORAL_TABLET | Freq: Every day | ORAL | 0 refills | Status: DC
  Filled 2021-01-11: qty 45, 30d supply, fill #0

## 2021-01-11 MED ORDER — LISINOPRIL-HYDROCHLOROTHIAZIDE 20-25 MG PO TABS
1.0000 | ORAL_TABLET | Freq: Every day | ORAL | 1 refills | Status: DC
  Filled 2021-01-11 – 2021-05-20 (×3): qty 90, 90d supply, fill #0
  Filled 2021-05-20: qty 90, 90d supply, fill #1
  Filled 2021-05-31: qty 90, 90d supply, fill #0

## 2021-01-11 MED ORDER — PANTOPRAZOLE SODIUM 40 MG PO TBEC
40.0000 mg | DELAYED_RELEASE_TABLET | Freq: Every day | ORAL | 3 refills | Status: DC
  Filled 2021-01-11: qty 30, 30d supply, fill #0

## 2021-01-11 NOTE — Addendum Note (Signed)
Addended by: Barbette Or on: 01/11/2021 09:45 AM   Modules accepted: Orders

## 2021-01-11 NOTE — Patient Instructions (Signed)

## 2021-01-11 NOTE — Progress Notes (Signed)
Patient ID: Kristi Cisneros, female    DOB: 12-26-1965  MRN: [id number]  CC: Medication Refill   Subjective: Kristi Cisneros is a 55 y.o. female who presents for chronic ds management Her concerns today include:  Patient with history of HTN, HL, preDM, GERD, tobacco dependence, CTS, fhx of breast CA in sister pos for BRCA gene  Fhx of breast CA:  was called for genetic testing/counseling within the past several weeks.  However she states she told him she would call them back once she gets insurance.  She now has insurance.  HTN:  taking Lis/HCTZ and trying to limit salt. Has home BP device.  Range no higher than 130/80 -Denies chest pains, shortness of breath, headaches or lower extremity edema.  HL: out of Lipitor x 2 wks. requests refills.  LDL was 124 when last checked.  We will increase the Lipitor to 60 mg daily.  GERD:  taking Protonix.  Medication works well for her.  She feels she still needs it.  Needs refills.  Insomnia: Reports good results with trazodone.  She tries not to take it every night.  Try not to take every night  Tob dep: "I've slowed down since having a grandbaby."  Not ready to quit.    Obesity/PreDM:  down 12 lbs since 03/2021.  Has problems controlling appetite.  Works a lot but on [employer], she tries to stay active.  When she gets bored she eats She tends to skip BF and lunch.  Over eats at dinner  HM:  due for flu shot.  Declines pneumonia vaccine.  Still has not turned in the fit kit.  We discussed other ways to be screened for colon cancer including colonoscopy or Cologuard.  She prefers to do the Cologuard.  She no showed appointment for Pap smear.  She wants to hold off on scheduling that for now.  States she will call back when she feels she can fit it into her schedule. Patient Active Problem List   Diagnosis Date Noted   Family history of breast cancer 03/18/2020   Other insomnia 03/18/2020   Class 2 severe obesity due to excess calories with serious  comorbidity in adult [facility]) 03/18/2020   Prediabetes 07/20/2019   GERD (gastroesophageal reflux disease) 04/05/2015   Vitamin D deficiency 11/23/2014   Bilateral hand numbness 11/20/2014   Subcutaneous nodule of chest wall 11/20/2014   Left shoulder pain 11/20/2014   Menorrhagia 09/03/2014   Patellofemoral arthritis of right knee 08/19/2014   Right knee pain 05/22/2014   Right ankle pain 04/17/2014   Plantar fasciitis, bilateral 03/27/2014   Achilles tendonitis 03/27/2014   HLD (hyperlipidemia) 03/06/2014   Current smoker 03/04/2014   Hypertension      No current outpatient medications on file prior to visit.   No current facility-administered medications on file prior to visit.    Allergies  Allergen Reactions   Levaquin [Levofloxacin In D5w] Other (See Comments)    "makes me feel Weird"    Social History   Socioeconomic History   Marital status: Single    Spouse name: Kandra Nicolas    Number of children: 1    Years of education: 12    Highest education level: Not on file  Occupational History   Occupation: Conservation officer, historic buildings: [employer]     Comment: Media planner   Tobacco Use   Smoking status: Every Day    Packs/day: 0.50    Years: 20.00  Pack years: 10.00    Types: Cigarettes   Smokeless tobacco: Never  Substance and Sexual Activity   Alcohol use: No    Comment: socially- ocassional   Drug use: No   Sexual activity: Yes    Birth control/protection: None  Other Topics Concern   Not on file  Social History Narrative   Lives with Lennette Bihari (partner of 34 years) and 24 yo son.       Social Determinants of Health   Financial Resource Strain: Not on file  Food Insecurity: Not on file  Transportation Needs: Not on file  Physical Activity: Not on file  Stress: Not on file  Social Connections: Not on file  Intimate Partner Violence: Not on file    Family History  Problem Relation Age of Onset   Hypertension Mother    Diabetes Mother     Hypertension Brother    Breast cancer Sister    Breast cancer Maternal Aunt     Past Surgical History:  Procedure Laterality Date   KNEE ARTHROSCOPY Right 01/26/2015   Procedure: ARTHROSCOPY KNEE, DEBRIDEMENT;  Surgeon: Meredith Pel, MD;  Location: [place];  Service: Orthopedics;  Laterality: Right;   NO PAST SURGERIES      ROS: Review of Systems Negative except as stated above  PHYSICAL EXAM: BP 130/83   Pulse (!) 103   Resp 16   Wt 231 lb 3.2 oz (104.9 kg)   SpO2 97%   BMI 36.21 kg/m   Wt Readings from Last 3 Encounters:  01/11/21 231 lb 3.2 oz (104.9 kg)  03/18/20 243 lb (110.2 kg)  02/04/20 227 lb (103 kg)    Physical Exam  General appearance - alert, well appearing, middle-age obese Caucasian female and in no distress Mental status - normal mood, behavior, speech, dress, motor activity, and thought processes Neck - supple, no significant adenopathy Chest - clear to auscultation, no wheezes, rales or rhonchi, symmetric air entry Heart - normal rate, regular rhythm, normal S1, S2, no murmurs, rubs, clicks or gallops Extremities - peripheral pulses normal, no pedal edema, no clubbing or cyanosis   CMP Latest Ref Rng & Units 06/23/2019 07/22/2018 11/27/2017  Glucose 65 - 99 mg/dL 168(H) 83 -  BUN 6 - 24 mg/dL 18 14 -  Creatinine 0.57 - 1.00 mg/dL 0.89 0.77 -  Sodium 134 - 144 mmol/L 139 141 -  Potassium 3.5 - 5.2 mmol/L 3.8 4.2 3.8  Chloride 96 - 106 mmol/L 103 101 -  CO2 20 - 29 mmol/L 21 24 -  Calcium 8.7 - 10.2 mg/dL 9.6 9.7 -  Total Protein 6.0 - 8.5 g/dL 6.9 6.8 -  Total Bilirubin 0.0 - 1.2 mg/dL 0.4 0.6 -  Alkaline Phos 39 - 117 IU/L 83 87 -  AST 0 - 40 IU/L 19 14 -  ALT 0 - 32 IU/L 20 19 -   Lipid Panel     Component Value Date/Time   CHOL 222 (H) 03/18/2020 1642   TRIG 246 (H) 03/18/2020 1642   HDL 55 03/18/2020 1642   CHOLHDL 4.0 03/18/2020 1642   CHOLHDL 5.3 (H) 05/23/2016 1234   VLDL 62 (H) 05/23/2016 1234   LDLCALC 124 (H) 03/18/2020 1642     CBC    Component Value Date/Time   WBC 7.9 06/23/2019 1640   WBC 5.8 01/26/2015 1340   RBC 4.01 06/23/2019 1640   RBC 4.39 01/26/2015 1340   HGB 12.0 06/23/2019 1640   HCT 36.6 06/23/2019 1640   PLT  279 06/23/2019 1640   MCV 91 06/23/2019 1640   MCH 29.9 06/23/2019 1640   MCH 31.2 01/26/2015 1340   MCHC 32.8 06/23/2019 1640   MCHC 33.8 01/26/2015 1340   RDW 13.2 06/23/2019 1640   LYMPHSABS 2.5 06/13/2017 1636   EOSABS 0.1 06/13/2017 1636   BASOSABS 0.0 06/13/2017 1636    ASSESSMENT AND PLAN: 1. Essential hypertension Close to goal.  Continue lisinopril/HCTZ.  Continue DASH diet. - lisinopril-hydrochlorothiazide (ZESTORETIC) 20-25 MG tablet; TAKE 1 TABLET BY MOUTH DAILY.  Dispense: 30 tablet; Refill: 0  2. Class 2 severe obesity due to excess calories with serious comorbidity and body mass index (BMI) of 36.0 to 36.9 in adult [facility]) Dietary counseling given.  Recommend that she avoid skipping meals.  If necessary eat smaller more frequent meals.  Recommend getting in some form of moderate intensity exercise several days a week for 30 minutes.  Printed information given on healthy eating habits.  3. Pure hypercholesterolemia - atorvastatin (LIPITOR) 40 MG tablet; Take 1.5 tablets (60 mg total) by mouth daily.  Dispense: 45 tablet; Refill: 0  4. Other insomnia - traZODone (DESYREL) 50 MG tablet; TAKE 0.5-1 TABLETS (25-50 MG TOTAL) BY MOUTH AT BEDTIME AS NEEDED FOR SLEEP.  Dispense: 30 tablet; Refill: 3  5. Gastroesophageal reflux disease, unspecified whether esophagitis present - pantoprazole (PROTONIX) 40 MG tablet; Take 1 tablet (40 mg total) by mouth daily.  Dispense: 30 tablet; Refill: 3  6. Tobacco dependence Advised to quit to decrease cardiovascular risks and cancer risks.  Patient not ready to give a trial of quitting.  7. Family history of breast cancer Strongly recommend that she consider doing the genetic counseling and screening for the BRCA gene given her  family history.  Informed that if she screens positive, she can then make an informed decision about whether she would want to have ovaries removed plus or minus mastectomy.  She will call them back to schedule  8. Screening for colon cancer - Cologuard  9. 23-polyvalent pneumococcal polysaccharide vaccine declined   10. Need for immunization against influenza - Flu Vaccine QUAD 24mo+IM (Fluarix, Fluzone & Alfiuria Quad PF)    Patient was given the opportunity to ask questions.  Patient verbalized understanding of the plan and was able to repeat key elements of the plan.   Orders Placed This Encounter  Procedures   Flu Vaccine QUAD 67mo+IM (Fluarix, Fluzone & Alfiuria Quad PF)   Cologuard     Requested Prescriptions   Signed Prescriptions Disp Refills   traZODone (DESYREL) 50 MG tablet 30 tablet 3    Sig: TAKE 0.5-1 TABLETS (25-50 MG TOTAL) BY MOUTH AT BEDTIME AS NEEDED FOR SLEEP.   lisinopril-hydrochlorothiazide (ZESTORETIC) 20-25 MG tablet 30 tablet 0    Sig: TAKE 1 TABLET BY MOUTH DAILY.   pantoprazole (PROTONIX) 40 MG tablet 30 tablet 3    Sig: Take 1 tablet (40 mg total) by mouth daily.   atorvastatin (LIPITOR) 40 MG tablet 45 tablet 0    Sig: Take 1.5 tablets (60 mg total) by mouth daily.    Return in about 4 months (around 05/13/2021).  Karle Plumber, MD, FACP

## 2021-01-12 ENCOUNTER — Other Ambulatory Visit: Payer: Self-pay

## 2021-01-19 NOTE — Addendum Note (Signed)
Addended by: Recardo Evangelist A on: 01/19/2021 08:12 AM   Modules accepted: Orders

## 2021-02-10 ENCOUNTER — Other Ambulatory Visit: Payer: Self-pay

## 2021-02-10 ENCOUNTER — Ambulatory Visit (HOSPITAL_COMMUNITY)
Admission: RE | Admit: 2021-02-10 | Discharge: 2021-02-10 | Disposition: A | Discharge status: Home / Self Care | Payer: 59 | Source: Ambulatory Visit | Attending: Physician Assistant | Admitting: Physician Assistant

## 2021-02-10 DIAGNOSIS — G8929 Other chronic pain: Secondary | ICD-10-CM | POA: Diagnosis present

## 2021-02-10 DIAGNOSIS — M25512 Pain in left shoulder: Secondary | ICD-10-CM | POA: Diagnosis present

## 2021-02-17 ENCOUNTER — Other Ambulatory Visit: Payer: Self-pay

## 2021-02-21 ENCOUNTER — Encounter: Payer: Self-pay | Admitting: Physician Assistant

## 2021-02-21 ENCOUNTER — Ambulatory Visit (INDEPENDENT_AMBULATORY_CARE_PROVIDER_SITE_OTHER): Payer: 59 | Admitting: Physician Assistant

## 2021-02-21 DIAGNOSIS — G8929 Other chronic pain: Secondary | ICD-10-CM | POA: Diagnosis not present

## 2021-02-21 DIAGNOSIS — M19012 Primary osteoarthritis, left shoulder: Secondary | ICD-10-CM | POA: Diagnosis not present

## 2021-02-21 DIAGNOSIS — M25512 Pain in left shoulder: Secondary | ICD-10-CM

## 2021-02-21 NOTE — Progress Notes (Signed)
HPI: [name] returns today for follow-up of her left shoulder status post MRI.  She states there is no change in overall pain.  She states she has pain with certain movements or pushing up on the shoulder.  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  She notes crossing her arm over her chest hurts.  She rates her pain to be 8-9 out of 10 pain at worst. MRI is reviewed with the patient shoulder models also used to further demonstrate the anatomy seen on the MRI.  MRI showed moderate tendinosis of the supraspinatus and infraspinatus tendons.  Severe arthropathy of the [facility] joint with marrow edema in either side of the joint.  Partial cartilage loss involving the posterior inferior glenoid with some subchondral reactive marrow edema.  Physical exam: Left shoulder with tenderness over the [facility] joint on palpation.  Out of 5 strength of external and internal rotation against resistance empty can test is negative bilaterally.  Positive crossover test on the left negative on the right.  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  We will discuss with Dr. Magnus Ivan upon his return and then call patient with Dr. Eliberto Ivory recommendations later this week.  Questions were encouraged and answered at length today.  Patient was agreeable with plan.

## 2021-02-23 ENCOUNTER — Other Ambulatory Visit: Payer: Self-pay

## 2021-02-23 ENCOUNTER — Telehealth: Payer: Self-pay | Admitting: Physician Assistant

## 2021-02-23 NOTE — Telephone Encounter (Signed)
Called patient let her know that I spoken with Dr. Magnus Ivan about her MRI findings and her clinical findings and he also recommended left shoulder arthroscopy with debridement and distal clavicle resection.  Patient would voice that she would like to proceed with surgery soon as possible.  We will work on setting this up and let her know.

## 2021-03-10 ENCOUNTER — Other Ambulatory Visit: Payer: Self-pay | Admitting: Physician Assistant

## 2021-03-15 ENCOUNTER — Other Ambulatory Visit: Payer: Self-pay

## 2021-03-16 ENCOUNTER — Other Ambulatory Visit: Payer: Self-pay

## 2021-03-16 ENCOUNTER — Encounter (HOSPITAL_BASED_OUTPATIENT_CLINIC_OR_DEPARTMENT_OTHER): Payer: Self-pay | Admitting: Orthopaedic Surgery

## 2021-03-22 ENCOUNTER — Encounter (HOSPITAL_BASED_OUTPATIENT_CLINIC_OR_DEPARTMENT_OTHER)
Admission: RE | Admit: 2021-03-22 | Discharge: 2021-03-22 | Disposition: A | Discharge status: Home / Self Care | Payer: 59 | Source: Ambulatory Visit | Attending: Orthopaedic Surgery | Admitting: Orthopaedic Surgery

## 2021-03-22 DIAGNOSIS — Z01818 Encounter for other preprocedural examination: Secondary | ICD-10-CM | POA: Diagnosis not present

## 2021-03-22 LAB — BASIC METABOLIC PANEL
Anion gap: 10 (ref 5–15)
BUN: 18 mg/dL (ref 6–20)
CO2: 24 mmol/L (ref 22–32)
Calcium: 10.1 mg/dL (ref 8.9–10.3)
Chloride: 103 mmol/L (ref 98–111)
Creatinine, Ser: 0.76 mg/dL (ref 0.44–1.00)
GFR, Estimated: >60 mL/min (ref >60)
Glucose, Bld: 133 mg/dL — ABNORMAL HIGH (ref 70–99)
Potassium: 4 mmol/L (ref 3.5–5.1)
Sodium: 137 mmol/L (ref 135–145)

## 2021-03-22 LAB — POCT PREGNANCY, URINE: Preg Test, Ur: NEGATIVE

## 2021-03-22 NOTE — Progress Notes (Signed)

## 2021-03-24 ENCOUNTER — Encounter (HOSPITAL_BASED_OUTPATIENT_CLINIC_OR_DEPARTMENT_OTHER): Payer: Self-pay | Admitting: Orthopaedic Surgery

## 2021-03-24 ENCOUNTER — Other Ambulatory Visit: Payer: Self-pay

## 2021-03-24 ENCOUNTER — Ambulatory Visit (HOSPITAL_BASED_OUTPATIENT_CLINIC_OR_DEPARTMENT_OTHER)
Admission: RE | Admit: 2021-03-24 | Discharge: 2021-03-24 | Disposition: A | Discharge status: Home / Self Care | Payer: 59 | Attending: Orthopaedic Surgery | Admitting: Orthopaedic Surgery

## 2021-03-24 ENCOUNTER — Ambulatory Visit (HOSPITAL_BASED_OUTPATIENT_CLINIC_OR_DEPARTMENT_OTHER): Payer: 59 | Admitting: Anesthesiology

## 2021-03-24 ENCOUNTER — Encounter (HOSPITAL_BASED_OUTPATIENT_CLINIC_OR_DEPARTMENT_OTHER)
Admission: RE | Disposition: A | Discharge status: Home / Self Care | Payer: Self-pay | Source: Home / Self Care | Attending: Orthopaedic Surgery

## 2021-03-24 DIAGNOSIS — Z6836 Body mass index (BMI) 36.0-36.9, adult: Secondary | ICD-10-CM | POA: Diagnosis not present

## 2021-03-24 DIAGNOSIS — I10 Essential (primary) hypertension: Secondary | ICD-10-CM | POA: Insufficient documentation

## 2021-03-24 DIAGNOSIS — F17210 Nicotine dependence, cigarettes, uncomplicated: Secondary | ICD-10-CM | POA: Insufficient documentation

## 2021-03-24 DIAGNOSIS — K219 Gastro-esophageal reflux disease without esophagitis: Secondary | ICD-10-CM | POA: Insufficient documentation

## 2021-03-24 DIAGNOSIS — M7542 Impingement syndrome of left shoulder: Secondary | ICD-10-CM | POA: Diagnosis not present

## 2021-03-24 DIAGNOSIS — E669 Obesity, unspecified: Secondary | ICD-10-CM | POA: Diagnosis not present

## 2021-03-24 HISTORY — PX: SHOULDER ARTHROSCOPY WITH DISTAL CLAVICLE RESECTION: SHX5675

## 2021-03-24 HISTORY — DX: Prediabetes: R73.03

## 2021-03-24 HISTORY — DX: Nicotine dependence, unspecified, uncomplicated: F17.200

## 2021-03-24 HISTORY — DX: Hyperlipidemia, unspecified: E78.5

## 2021-03-24 LAB — POCT PREGNANCY, URINE: Preg Test, Ur: NEGATIVE

## 2021-03-24 SURGERY — SHOULDER ARTHROSCOPY WITH DISTAL CLAVICLE RESECTION
Anesthesia: General | Site: Shoulder | Laterality: Left

## 2021-03-24 MED ORDER — MIDAZOLAM HCL 2 MG/2ML IJ SOLN
INTRAMUSCULAR | Status: AC
  Filled 2021-03-24: qty 2

## 2021-03-24 MED ORDER — LIDOCAINE HCL (CARDIAC) PF 100 MG/5ML IV SOSY
PREFILLED_SYRINGE | INTRAVENOUS | Status: DC | PRN
Start: 2021-03-24 — End: 2021-03-24
  Administered 2021-03-24: 60 mg via INTRAVENOUS

## 2021-03-24 MED ORDER — PROPOFOL 10 MG/ML IV BOLUS
INTRAVENOUS | Status: DC | PRN
  Administered 2021-03-24: 150 mg via INTRAVENOUS

## 2021-03-24 MED ORDER — DEXAMETHASONE SODIUM PHOSPHATE 10 MG/ML IJ SOLN
INTRAMUSCULAR | Status: DC | PRN
Start: 2021-03-24 — End: 2021-03-24
  Administered 2021-03-24: 10 mg via INTRAVENOUS

## 2021-03-24 MED ORDER — OXYCODONE HCL 5 MG/5ML PO SOLN: 5.0000 mg | Freq: Once | ORAL | Status: DC | PRN

## 2021-03-24 MED ORDER — PHENYLEPHRINE 40 MCG/ML (10ML) SYRINGE FOR IV PUSH (FOR BLOOD PRESSURE SUPPORT)
PREFILLED_SYRINGE | INTRAVENOUS | Status: AC
  Filled 2021-03-24: qty 10

## 2021-03-24 MED ORDER — FENTANYL CITRATE (PF) 100 MCG/2ML IJ SOLN
INTRAMUSCULAR | Status: AC
  Filled 2021-03-24: qty 2

## 2021-03-24 MED ORDER — ONDANSETRON HCL 4 MG/2ML IJ SOLN
INTRAMUSCULAR | Status: DC | PRN
  Administered 2021-03-24: 4 mg via INTRAVENOUS

## 2021-03-24 MED ORDER — ACETAMINOPHEN 500 MG PO TABS
ORAL_TABLET | ORAL | Status: AC
  Filled 2021-03-24: qty 2

## 2021-03-24 MED ORDER — BUPIVACAINE LIPOSOME 1.3 % IJ SUSP
INTRAMUSCULAR | Status: DC | PRN
  Administered 2021-03-24: 10 mL via PERINEURAL

## 2021-03-24 MED ORDER — FENTANYL CITRATE (PF) 100 MCG/2ML IJ SOLN: 25.0000 ug | INTRAMUSCULAR | Status: DC | PRN

## 2021-03-24 MED ORDER — CEFAZOLIN SODIUM-DEXTROSE 2-4 GM/100ML-% IV SOLN
2.0000 g | INTRAVENOUS | Status: AC
  Administered 2021-03-24: 2 g via INTRAVENOUS

## 2021-03-24 MED ORDER — SUGAMMADEX SODIUM 200 MG/2ML IV SOLN
INTRAVENOUS | Status: DC | PRN
  Administered 2021-03-24: 250 mg via INTRAVENOUS

## 2021-03-24 MED ORDER — ROCURONIUM BROMIDE 10 MG/ML (PF) SYRINGE
PREFILLED_SYRINGE | INTRAVENOUS | Status: AC
  Filled 2021-03-24: qty 10

## 2021-03-24 MED ORDER — BUPIVACAINE HCL (PF) 0.5 % IJ SOLN
INTRAMUSCULAR | Status: DC | PRN
  Administered 2021-03-24: 15 mL via PERINEURAL

## 2021-03-24 MED ORDER — DEXAMETHASONE SODIUM PHOSPHATE 10 MG/ML IJ SOLN
INTRAMUSCULAR | Status: AC
  Filled 2021-03-24: qty 1

## 2021-03-24 MED ORDER — PHENYLEPHRINE HCL (PRESSORS) 10 MG/ML IV SOLN
INTRAVENOUS | Status: DC | PRN
  Administered 2021-03-24: 40 ug via INTRAVENOUS
  Administered 2021-03-24: 80 ug via INTRAVENOUS
  Administered 2021-03-24: 40 ug via INTRAVENOUS
  Administered 2021-03-24: 80 ug via INTRAVENOUS
  Administered 2021-03-24: 120 ug via INTRAVENOUS

## 2021-03-24 MED ORDER — BUPIVACAINE HCL (PF) 0.25 % IJ SOLN
INTRAMUSCULAR | Status: AC
  Filled 2021-03-24: qty 30

## 2021-03-24 MED ORDER — BUPIVACAINE-EPINEPHRINE (PF) 0.25% -1:200000 IJ SOLN
INTRAMUSCULAR | Status: AC
  Filled 2021-03-24: qty 30

## 2021-03-24 MED ORDER — OXYCODONE HCL 5 MG PO TABS: 5.0000 mg | ORAL_TABLET | Freq: Once | ORAL | Status: DC | PRN

## 2021-03-24 MED ORDER — ONDANSETRON HCL 4 MG/2ML IJ SOLN
INTRAMUSCULAR | Status: AC
  Filled 2021-03-24: qty 2

## 2021-03-24 MED ORDER — FENTANYL CITRATE (PF) 100 MCG/2ML IJ SOLN
INTRAMUSCULAR | Status: DC | PRN
  Administered 2021-03-24: 50 ug via INTRAVENOUS

## 2021-03-24 MED ORDER — SODIUM CHLORIDE 0.9 % IR SOLN
Status: DC | PRN
  Administered 2021-03-24: 6000 mL

## 2021-03-24 MED ORDER — EPHEDRINE SULFATE 50 MG/ML IJ SOLN
INTRAMUSCULAR | Status: DC | PRN
  Administered 2021-03-24: 5 mg via INTRAVENOUS
  Administered 2021-03-24: 10 mg via INTRAVENOUS

## 2021-03-24 MED ORDER — MIDAZOLAM HCL 2 MG/2ML IJ SOLN
2.0000 mg | Freq: Once | INTRAMUSCULAR | Status: AC
  Administered 2021-03-24: 2 mg via INTRAVENOUS

## 2021-03-24 MED ORDER — PROMETHAZINE HCL 25 MG/ML IJ SOLN: 6.2500 mg | INTRAMUSCULAR | Status: DC | PRN

## 2021-03-24 MED ORDER — CEFAZOLIN SODIUM-DEXTROSE 2-4 GM/100ML-% IV SOLN
INTRAVENOUS | Status: AC
  Filled 2021-03-24: qty 100

## 2021-03-24 MED ORDER — PROPOFOL 10 MG/ML IV BOLUS
INTRAVENOUS | Status: AC
  Filled 2021-03-24: qty 20

## 2021-03-24 MED ORDER — FENTANYL CITRATE (PF) 100 MCG/2ML IJ SOLN
50.0000 ug | Freq: Once | INTRAMUSCULAR | Status: AC
Start: 2021-03-24 — End: 2021-03-24
  Administered 2021-03-24: 50 ug via INTRAVENOUS

## 2021-03-24 MED ORDER — EPHEDRINE 5 MG/ML INJ
INTRAVENOUS | Status: AC
  Filled 2021-03-24: qty 5

## 2021-03-24 MED ORDER — LACTATED RINGERS IV SOLN: INTRAVENOUS | Status: DC

## 2021-03-24 MED ORDER — OXYCODONE HCL 5 MG PO TABS: 5.0000 mg | ORAL_TABLET | Freq: Four times a day (QID) | ORAL | 0 refills | Status: DC | PRN

## 2021-03-24 MED ORDER — ROCURONIUM BROMIDE 100 MG/10ML IV SOLN
INTRAVENOUS | Status: DC | PRN
  Administered 2021-03-24: 70 mg via INTRAVENOUS

## 2021-03-24 MED ORDER — LIDOCAINE 2% (20 MG/ML) 5 ML SYRINGE
INTRAMUSCULAR | Status: AC
  Filled 2021-03-24: qty 5

## 2021-03-24 MED ORDER — ACETAMINOPHEN 500 MG PO TABS
1000.0000 mg | ORAL_TABLET | Freq: Once | ORAL | Status: AC
  Administered 2021-03-24: 1000 mg via ORAL

## 2021-03-24 SURGICAL SUPPLY — 60 items
AID PSTN UNV HD RSTRNT DISP (MISCELLANEOUS) ×1
BLADE SURG 15 STRL LF DISP TIS (BLADE) IMPLANT
BLADE SURG 15 STRL SS (BLADE)
BURR OVAL 8 FLU 4.0X13 (MISCELLANEOUS) ×2 IMPLANT
BURR OVAL 8 FLU 5.0X13 (MISCELLANEOUS) IMPLANT
CANNULA TWIST IN 8.25X7CM (CANNULA) IMPLANT
DECANTER SPIKE VIAL GLASS SM (MISCELLANEOUS) IMPLANT
DISSECTOR  3.8MM X 13CM (MISCELLANEOUS) ×2
DISSECTOR 3.8MM X 13CM (MISCELLANEOUS) ×1 IMPLANT
DISSECTOR 4.0MM X 13CM (MISCELLANEOUS) ×2 IMPLANT
DRAPE IMP U-DRAPE 54X76 (DRAPES) ×2 IMPLANT
DRAPE SHOULDER BEACH CHAIR (DRAPES) ×2 IMPLANT
DRAPE U-SHAPE 47X51 STRL (DRAPES) ×2 IMPLANT
DRSG PAD ABDOMINAL 8X10 ST (GAUZE/BANDAGES/DRESSINGS) ×2 IMPLANT
DURAPREP 26ML APPLICATOR (WOUND CARE) ×2 IMPLANT
ELECT REM PT RETURN 9FT ADLT (ELECTROSURGICAL)
ELECTRODE REM PT RTRN 9FT ADLT (ELECTROSURGICAL) IMPLANT
GAUZE SPONGE 4X4 12PLY STRL (GAUZE/BANDAGES/DRESSINGS) ×2 IMPLANT
GAUZE XEROFORM 1X8 LF (GAUZE/BANDAGES/DRESSINGS) ×2 IMPLANT
GLOVE SRG 8 PF TXTR STRL LF DI (GLOVE) ×2 IMPLANT
GLOVE SURG ENC MOIS LTX SZ7.5 (GLOVE) ×2 IMPLANT
GLOVE SURG ORTHO LTX SZ8 (GLOVE) ×2 IMPLANT
GLOVE SURG POLYISO LF SZ6.5 (GLOVE) ×1 IMPLANT
GLOVE SURG POLYISO LF SZ7 (GLOVE) ×1 IMPLANT
GLOVE SURG UNDER POLY LF SZ7 (GLOVE) ×2 IMPLANT
GLOVE SURG UNDER POLY LF SZ8 (GLOVE) ×4
GOWN STRL REUS W/ TWL LRG LVL3 (GOWN DISPOSABLE) ×1 IMPLANT
GOWN STRL REUS W/ TWL XL LVL3 (GOWN DISPOSABLE) ×1 IMPLANT
GOWN STRL REUS W/TWL LRG LVL3 (GOWN DISPOSABLE) ×4
GOWN STRL REUS W/TWL XL LVL3 (GOWN DISPOSABLE) ×2
KIT SHOULDER TRACTION (DRAPES) ×2 IMPLANT
MANIFOLD NEPTUNE II (INSTRUMENTS) ×2 IMPLANT
NDL SAFETY ECLIPSE 18X1.5 (NEEDLE) IMPLANT
NDL SCORPION MULTI FIRE (NEEDLE) IMPLANT
NEEDLE HYPO 18GX1.5 SHARP (NEEDLE)
NEEDLE SCORPION MULTI FIRE (NEEDLE) IMPLANT
NS IRRIG 1000ML POUR BTL (IV SOLUTION) IMPLANT
PACK ARTHROSCOPY DSU (CUSTOM PROCEDURE TRAY) ×2 IMPLANT
PACK BASIN DAY SURGERY FS (CUSTOM PROCEDURE TRAY) ×2 IMPLANT
PAD ORTHO SHOULDER 7X19 LRG (SOFTGOODS) IMPLANT
PENCIL SMOKE EVACUATOR (MISCELLANEOUS) IMPLANT
PORT APPOLLO RF 90DEGREE MULTI (SURGICAL WAND) ×2 IMPLANT
RESTRAINT HEAD UNIVERSAL NS (MISCELLANEOUS) ×2 IMPLANT
SLING ARM FOAM STRAP LRG (SOFTGOODS) ×1 IMPLANT
SLING ARM FOAM STRAP XLG (SOFTGOODS) ×1 IMPLANT
SLING ULTRA II MEDIUM (SOFTGOODS) IMPLANT
SPONGE T-LAP 4X18 ~~LOC~~+RFID (SPONGE) ×1 IMPLANT
SUCTION FRAZIER HANDLE 10FR (MISCELLANEOUS)
SUCTION TUBE FRAZIER 10FR DISP (MISCELLANEOUS) IMPLANT
SUT ETHIBOND 2 OS 4 DA (SUTURE) IMPLANT
SUT ETHILON 3 0 PS 1 (SUTURE) ×2 IMPLANT
SUT VIC AB 2-0 SH 27 (SUTURE)
SUT VIC AB 2-0 SH 27XBRD (SUTURE) IMPLANT
SYR 20ML LL LF (SYRINGE) IMPLANT
SYR BULB EAR ULCER 3OZ GRN STR (SYRINGE) IMPLANT
TAPE HYPAFIX 6X30 (GAUZE/BANDAGES/DRESSINGS) ×2 IMPLANT
TOWEL GREEN STERILE FF (TOWEL DISPOSABLE) ×2 IMPLANT
TUBE CONNECTING 20X1/4 (TUBING) ×2 IMPLANT
TUBING ARTHROSCOPY IRRIG 16FT (MISCELLANEOUS) ×2 IMPLANT
WATER STERILE IRR 1000ML POUR (IV SOLUTION) ×2 IMPLANT

## 2021-03-24 NOTE — Anesthesia Procedure Notes (Signed)
Anesthesia Regional Block: Interscalene brachial plexus block   Pre-Anesthetic Checklist: , timeout performed,  Correct Patient, Correct Site, Correct Laterality,  Correct Procedure, Correct Position, site marked,  Risks and benefits discussed,  Surgical consent,  Pre-op evaluation,  At surgeon's request and post-op pain management  Laterality: Left  Prep: chloraprep       Needles:  Injection technique: Single-shot  Needle Type: Echogenic Needle     Needle Length: 5cm  Needle Gauge: 21     Additional Needles:   Narrative:  Start time: 03/24/2021 9:06 AM End time: 03/24/2021 9:10 AM Injection made incrementally with aspirations every 5 mL.  Performed by: Personally  Anesthesiologist: Beryle Lathe, MD  Additional Notes: No pain on injection. No increased resistance to injection. Injection made in 5cc increments. Good needle visualization. Patient tolerated the procedure well.

## 2021-03-24 NOTE — Op Note (Signed)
NAME: Kristi Cisneros, Kristi Cisneros MEDICAL RECORD NO: [id number] ACCOUNT NO: [id number] DATE OF BIRTH: 05-13-1966 FACILITY: [facility] LOCATION: [facility] PHYSICIAN: Vanita Panda. Magnus Ivan, MD  Operative Report   DATE OF PROCEDURE: 03/24/2021  PREOPERATIVE DIAGNOSIS:  Severe impingement syndrome, left shoulder.  POSTOPERATIVE DIAGNOSIS:  Severe impingement syndrome, left shoulder.  PROCEDURE:  Left shoulder arthroscopy with extensive debridement, subacromial decompression and distal clavicle resection.  SURGEON:  Doneen Poisson, M.D.  ASSISTANT:  Richardean Canal, PA-C.  ANESTHESIA: 1.  Left upper extremity regional shoulder block. 2.  General.  BLOOD LOSS:  Minimal.  COMPLICATIONS:  None.  ANTIBIOTICS:  2 grams IV Ancef.  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  She has a lot of overhead activities and has developed significant shoulder impingement syndrome.  This is of the left shoulder.  She has tried and failed  all forms of conservative treatment for over a year, including therapy and multiple steroid injections as well as activity modification and anti-inflammatories.  At this point, an MRI was obtained of the left shoulder showing significant tendinosis of  the rotator cuff and motor arthropathy AC joint.  There is also extensive bursitis in the subacromial outlet.  At this point, we have recommended arthroscopic intervention of the shoulder given the failure of conservative treatment and after continued  pain.  We did describe in detail the risks and benefits of the surgery and she does wish to proceed given the detrimental effect this is having on her activities of daily living and her work in general.  DESCRIPTION OF PROCEDURE:  After informed consent was obtained, appropriate left shoulder was marked.  Anesthesia obtained, a regional shoulder block of the left shoulder.  She was brought to the operating room and placed supine  on the operating table.   General anesthesia was obtained.  She was then fashioned in a beach chair position with appropriate position and her head and neck and padding down nonoperative right arm, there is bending at the waist and the knees and palpable pulses in her feet.  Her  left shoulder was then prepped and draped with DuraPrep and sterile drapes including a sterile stockinette and placed in line skeletal traction using a fishing pole traction device and 10 pounds of traction with 45 degrees of forward flexion and anterior  rotation.  A timeout was called to identify correct patient, correct left shoulder.  I then made a posterolateral arthroscopy portal and entered the glenohumeral joint.  We found just some mild wear of the inferior aspect of the glenoid and there was  some degenerative tearing of the anterior labrum and superior labrum.  Through an rotator interval in the front of the shoulder we performed a debridement of the anterior labrum and the superior labrum.  There was some mild synovitis in the anterior  shoulder and the undersurface of the rotator cuff was debrided as well.  The biceps tendon was intact and most of the cartilage on the humeral head and glenoid were intact.  There was no undersurface tearing of the rotator cuff.  The subscapularis was  also intact.  We then entered the subacromial space through the posterior portal, made a separate lateral portal.  We did find significant bursitis within the shoulder and tendinosis of the rotator cuff.  Using arthroscopic shaver as well as soft tissue  with blunt ablation wand we had to carry on a significant debridement of the bursal surface of the rotator cuff.  We used a  high-speed bur to perform partial acromioplasty and a partial distal clavicle resection, removing bulky arthropathy tissue from  the distal clavicle area and in between joint with the acromion.  We felt that we had really adequately performed a subacromial  decompression to open up that space.  We put the shoulder through internal and external rotation.  Rotator cuff did move with  the humeral head as a unit.  There was no evidence of rotator cuff tear at all, just significant thickened tendinosis tissue.  We then removed all instrumentation from the shoulder and closed all 3 portal sites with interrupted nylon suture.  Xeroform  well-padded sterile dressing was applied.  Her arm was placed in a sling.  She was awakened, extubated, and taken to recovery room in stable condition with all final counts being correct.  No complications noted.  Of note, Rexene Edison, PA-C did assist  during the entirety of this case and his assistance was crucial for facilitating all aspects of the case.  Postoperatively, she will be discharged home from the PACU with a followup in the office in a week.   PUS D: 03/24/2021 11:47:43 am T: 03/24/2021 12:57:00 pm  JOB: [id number]/ [id number]

## 2021-03-24 NOTE — Discharge Instructions (Addendum)
You may increase your activities as comfort allows. Come in and out of the sling as comfort allows.   You can remove your dressing and shower in 1-2 days and get your incisions wet. Place band-aids over the incisions daily after you shower. No heavy lifting until further notice and do not reach overhead with your right arm. Do sleep in your sling.   Post Anesthesia Home Care Instructions  Activity: Get plenty of rest for the remainder of the day. A responsible individual must stay with you for 24 hours following the procedure.  For the next 24 hours, DO NOT: -Drive a car -Advertising copywriter -Drink alcoholic beverages -Take any medication unless instructed by your physician -Make any legal decisions or sign important papers.  Meals: Start with liquid foods such as gelatin or soup. Progress to regular foods as tolerated. Avoid greasy, spicy, heavy foods. If nausea and/or vomiting occur, drink only clear liquids until the nausea and/or vomiting subsides. Call your physician if vomiting continues.  Special Instructions/Symptoms: Your throat may feel dry or sore from the anesthesia or the breathing tube placed in your throat during surgery. If this causes discomfort, gargle with warm salt water. The discomfort should disappear within 24 hours.  If you had a scopolamine patch placed behind your ear for the management of post- operative nausea and/or vomiting:  1. The medication in the patch is effective for 72 hours, after which it should be removed.  Wrap patch in a tissue and discard in the trash. Wash hands thoroughly with soap and water. 2. You may remove the patch earlier than 72 hours if you experience unpleasant side effects which may include dry mouth, dizziness or visual disturbances. 3. Avoid touching the patch. Wash your hands with soap and water after contact with the patch.

## 2021-03-24 NOTE — Anesthesia Preprocedure Evaluation (Addendum)
Anesthesia Evaluation  Patient identified by MRN, date of birth, ID band Patient awake    Reviewed: Allergy & Precautions, NPO status , Patient's Chart, lab work & pertinent test results  History of Anesthesia Complications Negative for: history of anesthetic complications  Airway Mallampati: II  TM Distance: >3 FB Neck ROM: Full    Dental  (+) Dental Advisory Given, Teeth Intact   Pulmonary Current Smoker and Patient abstained from smoking.,    Pulmonary exam normal        Cardiovascular hypertension, Pt. on medications Normal cardiovascular exam     Neuro/Psych  Neuromuscular disease (b/l hand numbness) negative psych ROS   GI/Hepatic Neg liver ROS, GERD  Medicated and Controlled,  Endo/Other   Obesity Pre-DM   Renal/GU negative Renal ROS     Musculoskeletal  (+) Arthritis ,   Abdominal   Peds  Hematology negative hematology ROS (+)   Anesthesia Other Findings   Reproductive/Obstetrics                            Anesthesia Physical Anesthesia Plan  ASA: 2  Anesthesia Plan: General   Post-op Pain Management:  Regional for Post-op pain   Induction: Intravenous  PONV Risk Score and Plan: 2 and Treatment may vary due to age or medical condition, Ondansetron, Midazolam and Dexamethasone  Airway Management Planned: Oral ETT  Additional Equipment: None  Intra-op Plan:   Post-operative Plan: Extubation in OR  Informed Consent: I have reviewed the patients History and Physical, chart, labs and discussed the procedure including the risks, benefits and alternatives for the proposed anesthesia with the patient or authorized representative who has indicated his/her understanding and acceptance.     Dental advisory given  Plan Discussed with: CRNA and Anesthesiologist  Anesthesia Plan Comments:        Anesthesia Quick Evaluation

## 2021-03-24 NOTE — Transfer of Care (Signed)
Immediate Anesthesia Transfer of Care Note  Patient: Kristi Cisneros  Procedure(s) Performed: LEFT SHOULDER ARTHROSCOPY WITH DEBRIDEMENT AND DISTAL CLAVICLE RESECTION (Left: Shoulder)  Patient Location: PACU  Anesthesia Type:General  Level of Consciousness: awake, alert , oriented and patient cooperative  Airway & Oxygen Therapy: Patient Spontanous Breathing and Patient connected to face mask oxygen  Post-op Assessment: Report given to RN and Post -op Vital signs reviewed and stable  Post vital signs: Reviewed and stable  Last Vitals:  Vitals Value Taken Time  BP    Temp    Pulse    Resp    SpO2      Last Pain:  Vitals:   03/24/21 0836  TempSrc: Oral  PainSc: 5          Complications: No notable events documented.

## 2021-03-24 NOTE — Anesthesia Postprocedure Evaluation (Signed)
Anesthesia Post Note  Patient: Kristi Cisneros  Procedure(s) Performed: LEFT SHOULDER ARTHROSCOPY WITH DEBRIDEMENT AND DISTAL CLAVICLE RESECTION (Left: Shoulder)     Patient location during evaluation: PACU Anesthesia Type: General Level of consciousness: awake and alert Pain management: pain level controlled Vital Signs Assessment: post-procedure vital signs reviewed and stable Respiratory status: spontaneous breathing, nonlabored ventilation and respiratory function stable Cardiovascular status: stable and blood pressure returned to baseline Anesthetic complications: no   No notable events documented.  Last Vitals:  Vitals:   03/24/21 1215 03/24/21 1230  BP: 131/86 119/77  Pulse: 85 80  Resp: 16 12  Temp:    SpO2: 94% 96%    Last Pain:  Vitals:   03/24/21 1230  TempSrc:   PainSc: 0-No pain                 Beryle Lathe

## 2021-03-24 NOTE — H&P (Signed)
Kristi Cisneros is an 55 y.o. female.   Chief Complaint: Left shoulder pain and weakness HPI: The patient is a 55 year old female with well-documented severe impingement syndrome of her left shoulder.  She has tried and failed all forms of conservative treatment including activity modification, anti-inflammatories, steroid injections and physical therapy.  A MRI was obtained of her left shoulder showing significant tendinosis of the rotator cuff but no tear.  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  At this point we recommended arthroscopic intervention with a subacromial decompression and debridement for her left shoulder.  Past Medical History:  Diagnosis Date   Arthritis    GERD (gastroesophageal reflux disease)    Hyperlipidemia    Hypertension Dx 2015   Pre-diabetes    Smoker    1/2 ppd    Past Surgical History:  Procedure Laterality Date   KNEE ARTHROSCOPY Right 01/26/2015   Procedure: ARTHROSCOPY KNEE, DEBRIDEMENT;  Surgeon: Cammy Copa, MD;  Location: [facility] OR;  Service: Orthopedics;  Laterality: Right;    Family History  Problem Relation Age of Onset   Hypertension Mother    Diabetes Mother    Hypertension Brother    Breast cancer Sister    Breast cancer Maternal Aunt    Social History:  reports that she has been smoking cigarettes. She has a 10.00 pack-year smoking history. She has never used smokeless tobacco. She reports current alcohol use. She reports that she does not use drugs.  Allergies:  Allergies  Allergen Reactions   Levaquin [Levofloxacin In D5w] Other (See Comments)    "makes me feel Weird"    Medications Prior to Admission  Medication Sig Dispense Refill   atorvastatin (LIPITOR) 40 MG tablet Take 1.5 tablets (60 mg total) by mouth daily. 135 tablet 1   fexofenadine (ALLEGRA) 180 MG tablet Take 180 mg by mouth daily.     lisinopril-hydrochlorothiazide (ZESTORETIC) 20-25 MG tablet TAKE 1 TABLET BY MOUTH DAILY. 90 tablet 1    Multiple Vitamins-Minerals (CENTRUM ADULTS) TABS Take by mouth.     pantoprazole (PROTONIX) 40 MG tablet Take 1 tablet (40 mg total) by mouth daily. 90 tablet 1   traZODone (DESYREL) 50 MG tablet TAKE 0.5-1 TABLETS (25-50 MG TOTAL) BY MOUTH AT BEDTIME AS NEEDED FOR SLEEP. 30 tablet 3    Results for orders placed or performed during the hospital encounter of 03/24/21 (from the past 48 hour(s))  Basic metabolic panel per protocol     Status: Abnormal   Collection Time: 03/22/21  1:13 PM  Result Value Ref Range   Sodium 137 135 - 145 mmol/L   Potassium 4.0 3.5 - 5.1 mmol/L   Chloride 103 98 - 111 mmol/L   CO2 24 22 - 32 mmol/L   Glucose, Bld 133 (H) 70 - 99 mg/dL    Comment: Glucose reference range applies only to samples taken after fasting for at least 8 hours.   BUN 18 6 - 20 mg/dL   Creatinine, Ser 8.52 0.44 - 1.00 mg/dL   Calcium 77.8 8.9 - 24.2 mg/dL   GFR, Estimated >35 >36 mL/min    Comment: (NOTE) Calculated using the CKD-EPI Creatinine Equation (2021)    Anion gap 10 5 - 15    Comment: Performed at [facility], [address], [place], [place] [address]  Pregnancy, urine POC     Status: None   Collection Time: 03/24/21  8:33 AM  Result Value Ref Range   Preg Test,  Ur NEGATIVE NEGATIVE    Comment:        THE SENSITIVITY OF THIS METHODOLOGY IS >24 mIU/mL    No results found.  Review of Systems  All other systems reviewed and are negative.  Blood pressure 119/64, pulse 77, temperature 97.8 F (36.6 C), temperature source Oral, resp. rate 11, height 5\' 7"  (1.702 m), weight 112.4 kg, last menstrual period 07/13/2020, SpO2 98 %. Physical Exam Vitals reviewed.  Constitutional:      Appearance: Normal appearance.  HENT:     Head: Normocephalic and atraumatic.  Eyes:     Extraocular Movements: Extraocular movements intact.     Pupils: Pupils are equal, round, and reactive to light.  Cardiovascular:     Rate and Rhythm: Normal rate and regular rhythm.   Pulmonary:     Effort: Pulmonary effort is normal.     Breath sounds: Normal breath sounds.  Abdominal:     Palpations: Abdomen is soft.  Musculoskeletal:     Left shoulder: Tenderness and bony tenderness present. Decreased range of motion. Decreased strength.     Cervical back: Normal range of motion and neck supple.  Neurological:     Mental Status: She is alert and oriented to person, place, and time.  Psychiatric:        Behavior: Behavior normal.     Assessment/Plan Left shoulder severe impingement syndrome  The plan is to proceed to surgery today for left shoulder arthroscopy with extensive debridement and subacromial decompression.  The risks and benefits of surgery been explained in detail and informed consent is obtained.  09/12/2020, MD 03/24/2021, 10:13 AM

## 2021-03-24 NOTE — Anesthesia Procedure Notes (Signed)
Procedure Name: Intubation Date/Time: 03/24/2021 10:46 AM Performed by: Garrel Ridgel, CRNA Pre-anesthesia Checklist: Patient identified, Emergency Drugs available, Suction available and Patient being monitored Patient Re-evaluated:Patient Re-evaluated prior to induction Oxygen Delivery Method: Circle system utilized Preoxygenation: Pre-oxygenation with 100% oxygen Induction Type: IV induction Ventilation: Mask ventilation without difficulty Laryngoscope Size: Mac and 3 Grade View: Grade II Tube type: Oral Tube size: 7.0 mm Number of attempts: 1 Airway Equipment and Method: Stylet and Oral airway Placement Confirmation: ETT inserted through vocal cords under direct vision, positive ETCO2 and breath sounds checked- equal and bilateral Secured at: 23 cm Tube secured with: Tape Dental Injury: Teeth and Oropharynx as per pre-operative assessment

## 2021-03-24 NOTE — Progress Notes (Signed)
AssistedDr. Brock with left, ultrasound guided, interscalene  block. Side rails up, monitors on throughout procedure. See vital signs in flow sheet. Tolerated Procedure well.  

## 2021-03-24 NOTE — Brief Op Note (Signed)
03/24/2021  11:49 AM  PATIENT:  Kristi Cisneros  55 y.o. female  PRE-OPERATIVE DIAGNOSIS:  left acromioclavicular joint arthritis, left shoulder rotator cuff tendinosis  POST-OPERATIVE DIAGNOSIS:  left acromioclavicular joint arthritis, left shoulder rotator cuff tendinosis  PROCEDURE:  Procedure(s): LEFT SHOULDER ARTHROSCOPY WITH DEBRIDEMENT AND DISTAL CLAVICLE RESECTION (Left)  SURGEON:  Surgeon(s) and Role:    Kathryne Hitch, MD - Primary  PHYSICIAN ASSISTANT: Rexene Edison, PA-C  ANESTHESIA:   regional and general  EBL:  5 mL   COUNTS:  YES  DICTATION: .Other Dictation: Dictation Number [id number]  PLAN OF CARE: Discharge to home after PACU  PATIENT DISPOSITION:  PACU - hemodynamically stable.   Delay start of Pharmacological VTE agent (>24hrs) due to surgical blood loss or risk of bleeding: no

## 2021-03-25 ENCOUNTER — Encounter (HOSPITAL_BASED_OUTPATIENT_CLINIC_OR_DEPARTMENT_OTHER): Payer: Self-pay | Admitting: Orthopaedic Surgery

## 2021-03-25 NOTE — Addendum Note (Signed)
Addendum  created 03/25/21 0940 by Alford Highland, CRNA   Charge Capture section accepted

## 2021-03-31 ENCOUNTER — Ambulatory Visit (INDEPENDENT_AMBULATORY_CARE_PROVIDER_SITE_OTHER): Payer: 59 | Admitting: Orthopaedic Surgery

## 2021-03-31 ENCOUNTER — Encounter: Payer: Self-pay | Admitting: Orthopaedic Surgery

## 2021-03-31 DIAGNOSIS — G8929 Other chronic pain: Secondary | ICD-10-CM

## 2021-03-31 DIAGNOSIS — Z9889 Other specified postprocedural states: Secondary | ICD-10-CM

## 2021-03-31 DIAGNOSIS — M25512 Pain in left shoulder: Secondary | ICD-10-CM

## 2021-03-31 MED ORDER — OXYCODONE HCL 5 MG PO TABS: 5.0000 mg | ORAL_TABLET | Freq: Four times a day (QID) | ORAL | 0 refills | Status: DC | PRN

## 2021-03-31 MED ORDER — IBUPROFEN 800 MG PO TABS: 800.0000 mg | ORAL_TABLET | Freq: Three times a day (TID) | ORAL | 1 refills | Status: DC | PRN

## 2021-03-31 NOTE — Progress Notes (Signed)
The patient is 1 week status post a left shoulder arthroscopy with debridement and subacromial decompression.  Her rotator cuff was intact.  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  She has been having some pain to be appropriate and has been sore but has been trying to work on motion of her left shoulder.  On exam her axillary nerve is functioning.  She let me put her gently through some rotation of her left shoulder.  I went over her arthroscopy pictures and showed her the extent of the subacromial decompression that we performed.  Fortunately the rotator cuff is intact.  I will continue her oxycodone and will add 800 mg ibuprofen to this.  We will see her back in 2 weeks to see how she is doing overall with a home exercise program and time.  I did let her know that I would likely recommend a steroid injection postoperative in the subacromial outlet space to decrease the postoperative pain.

## 2021-04-14 ENCOUNTER — Other Ambulatory Visit: Payer: Self-pay

## 2021-04-14 ENCOUNTER — Encounter: Payer: Self-pay | Admitting: Physician Assistant

## 2021-04-14 ENCOUNTER — Ambulatory Visit (INDEPENDENT_AMBULATORY_CARE_PROVIDER_SITE_OTHER): Payer: 59 | Admitting: Physician Assistant

## 2021-04-14 DIAGNOSIS — Z9889 Other specified postprocedural states: Secondary | ICD-10-CM

## 2021-04-14 NOTE — Progress Notes (Signed)
HPI: Ms. Remmert returns today status post left shoulder arthroscopy with debridement subacromial decompression.  She states she is sore but getting better.  She ranks her pain to be 5-6 out of 10 pain at worst.  She has taken ibuprofen during the day and taking some oxycodone at night.  She continues to do her home exercise program.  Physical exam: HEENT bring her left arm fully overhead.  She has minimal discomfort with abduction of the arm across her chest.  Internal and external rotation revealed fluid motion with minimal discomfort.  Impression: Status post left shoulder arthroscopy 03/24/2021  Plan: She will continue to work on range of motion strengthening the shoulder.  Offered subacromial injection today she defers.  We will see her back in just 3 weeks to see how she is doing overall she continues to have pain and decreased active range of motion recommend an injection and possible outpatient physical therapy.  Questions were encouraged and answered.

## 2021-05-04 ENCOUNTER — Ambulatory Visit: Payer: Self-pay | Admitting: *Deleted

## 2021-05-04 NOTE — Telephone Encounter (Signed)
This is not our patient.

## 2021-05-04 NOTE — Telephone Encounter (Signed)
°  Chief Complaint: Covid positive today Symptoms: Stuffy nose, sneezing, dry cough, mild sore throat Frequency: Symptoms onset this AM Pertinent Negatives: Patient denies SOB, headache, CP, fever Disposition: [] ED /[] Urgent Care (no appt availability in office) / [] Appointment(In office/virtual)/ []  [facility]/ [x] Home Care/ [] Refused Recommended Disposition  Additional Notes: Home care advise provided as well as guidelines for isolation, symptoms that warrant an ED eval. . Pt verbalizes understanding.  Pt considered High Risk per protocol. Will route to practice for providers review.

## 2021-05-04 NOTE — Telephone Encounter (Signed)
Pt tested positive for covid this morning / she has cold symptoms, stuffy nose, sneezing, cough/ symptoms started on Tuesday morning / please advise what pt can do to help with this    Reason for Disposition  [1] HIGH RISK patient AND [2] influenza is widespread in the community AND [3] ONE OR MORE respiratory symptoms: cough, sore throat, runny or stuffy nose  Answer Assessment - Initial Assessment Questions 1. COVID-19 DIAGNOSIS: "Who made your COVID-19 diagnosis?" "Was it confirmed by a positive lab test or self-test?" If not diagnosed by a doctor (or NP/PA), ask "Are there lots of cases (community spread) where you live?" Note: See public health department website, if unsure.     Home test this AM 2. COVID-19 EXPOSURE: "Was there any known exposure to COVID before the symptoms began?" CDC Definition of close contact: within 6 feet (2 meters) for a total of 15 minutes or more over a 24-hour period.      Yes, husband exposed and positive Sunday 3. ONSET: "When did the COVID-19 symptoms start?"      Yesterday 4. WORST SYMPTOM: "What is your worst symptom?" (e.g., cough, fever, shortness of breath, muscle aches)     Head cold 5. COUGH: "Do you have a cough?" If Yes, ask: "How bad is the cough?"       Yes, mild mostly dry 6. FEVER: "Do you have a fever?" If Yes, ask: "What is your temperature, how was it measured, and when did it start?"     No 7. RESPIRATORY STATUS: "Describe your breathing?" (e.g., shortness of breath, wheezing, unable to speak)      no 8. BETTER-SAME-WORSE: "Are you getting better, staying the same or getting worse compared to yesterday?"  If getting worse, ask, "In what way?"     worse 9. HIGH RISK DISEASE: "Do you have any chronic medical problems?" (e.g., asthma, heart or lung disease, weak immune system, obesity, etc.)      10 . VACCINE: "Have you had the COVID-19 vaccine?" If Yes, ask: "Which one, how many shots, when did you get it?"       2 11. BOOSTER: "Have you  received your COVID-19 booster?" If Yes, ask: "Which one and when did you get it?"       0  13. OTHER SYMPTOMS: "Do you have any other symptoms?"  (e.g., chills, fatigue, headache, loss of smell or taste, muscle pain, sore throat)       Sneezing nose, sneezing, mild sore throat 14. O2 SATURATION MONITOR:  "Do you use an oxygen saturation monitor (pulse oximeter) at home?" If Yes, ask "What is your reading (oxygen level) today?" "What is your usual oxygen saturation reading?" (e.g., 95%)       *No Answer*  Protocols used: Coronavirus (COVID-19) Diagnosed or Suspected-A-AH

## 2021-05-05 ENCOUNTER — Encounter: Payer: Self-pay | Admitting: Nurse Practitioner

## 2021-05-05 ENCOUNTER — Other Ambulatory Visit: Payer: Self-pay

## 2021-05-05 ENCOUNTER — Telehealth (INDEPENDENT_AMBULATORY_CARE_PROVIDER_SITE_OTHER): Payer: 59 | Admitting: Nurse Practitioner

## 2021-05-05 ENCOUNTER — Encounter: Payer: 59 | Admitting: Physician Assistant

## 2021-05-05 DIAGNOSIS — U071 COVID-19: Secondary | ICD-10-CM | POA: Diagnosis not present

## 2021-05-05 MED ORDER — MOLNUPIRAVIR EUA 200MG CAPSULE
4.0000 | ORAL_CAPSULE | Freq: Two times a day (BID) | ORAL | 0 refills | Status: AC
  Filled 2021-05-05: qty 40, 5d supply, fill #0

## 2021-05-05 NOTE — Progress Notes (Signed)
Virtual Visit via Telephone Note  I connected with Kristi Cisneros on 05/05/21 at  3:00 PM EST by telephone and verified that I am speaking with the correct person using two identifiers.  Location: Patient: home Provider: office   I discussed the limitations, risks, security and privacy concerns of performing an evaluation and management service by telephone and the availability of in person appointments. I also discussed with the patient that there may be a patient responsible charge related to this service. The patient expressed understanding and agreed to proceed.   History of Present Illness:  Patient presents today for sick visit through telephone visit.  Patient tested positive for COVID yesterday.  Her symptoms started on Tuesday.  Patient states that she has been having cough, head cold, sore throat. Denies f/c/s, n/v/d, hemoptysis, PND, chest pain or edema.     Observations/Objective:  Vitals with BMI 03/24/2021 03/24/2021 03/24/2021  Height - - -  Weight - - -  BMI - - -  Systolic 140 119 462  Diastolic 84 77 86  Pulse 80 80 85      Assessment and Plan:  Patient Instructions  Covid 19 Cough:   Stay well hydrated  Stay active  Deep breathing exercises  May take tylenol or fever or pain  May take mucinex twice daily  You are being prescribed MOLNUPIRAVIR for COVID-19 infection.  [facility] ([address] [place], [place] #[phone])  Monday through Friday 8a-5:30p    Please call the pharmacy or go through the drive through vs going inside if you are picking up the mediation yourself to prevent further spread. If prescribed to a [facility] affiliated pharmacy, a pharmacist will bring the medication out to your car.   ADMINISTRATION INSTRUCTIONS: Take with or without food. Swallow the tablets whole. Don't chew, crush, or break the medications because it might not work as well  For each dose of the  medication, you should be taking FOUR tablets at one time, TWICE a day   Finish your full five-day course of Molnupiravir even if you feel better before you're done. Stopping this medication too early can make it less effective to prevent severe illness related to COVID19.    Molnupiravir is prescribed for YOU ONLY. Don't share it with others, even if they have similar symptoms as you. This medication might not be right for everyone.   Make sure to take steps to protect yourself and others while you're taking this medication in order to get well soon and to prevent others from getting sick with COVID-19.     COMMON SIDE EFFECTS: Diarrhea Nausea  Dizziness    If your COVID-19 symptoms get worse, get medical help right away. Call 911 if you experience symptoms such as worsening cough, trouble breathing, chest pain that doesn't go away, confusion, a hard time staying awake, and pale or blue-colored skin. This medication won't prevent all COVID-19 cases from getting worse.     Follow up:  Follow up if needed      I discussed the assessment and treatment plan with the patient. The patient was provided an opportunity to ask questions and all were answered. The patient agreed with the plan and demonstrated an understanding of the instructions.   The patient was advised to call back or seek an in-person evaluation if the symptoms worsen or if the condition fails to improve as anticipated.  I provided 23 minutes of non-face-to-face time during this encounter.  Ivonne Andrew, NP

## 2021-05-05 NOTE — Telephone Encounter (Signed)
Left message on voicemail-  Attempt to schedule an appt at [facility]- with Provider Tanda Rockers, NP for virtual appt.

## 2021-05-05 NOTE — Patient Instructions (Signed)
Covid 19 Cough:   Stay well hydrated  Stay active  Deep breathing exercises  May take tylenol or fever or pain  May take mucinex twice daily  You are being prescribed MOLNUPIRAVIR for COVID-19 infection.  [facility] ([address] [place], [place] #[phone])  Monday through Friday 8a-5:30p    Please call the pharmacy or go through the drive through vs going inside if you are picking up the mediation yourself to prevent further spread. If prescribed to a [facility] affiliated pharmacy, a pharmacist will bring the medication out to your car.   ADMINISTRATION INSTRUCTIONS: Take with or without food. Swallow the tablets whole. Don't chew, crush, or break the medications because it might not work as well  For each dose of the medication, you should be taking FOUR tablets at one time, TWICE a day   Finish your full five-day course of Molnupiravir even if you feel better before you're done. Stopping this medication too early can make it less effective to prevent severe illness related to COVID19.    Molnupiravir is prescribed for YOU ONLY. Don't share it with others, even if they have similar symptoms as you. This medication might not be right for everyone.   Make sure to take steps to protect yourself and others while you're taking this medication in order to get well soon and to prevent others from getting sick with COVID-19.     COMMON SIDE EFFECTS: Diarrhea Nausea  Dizziness    If your COVID-19 symptoms get worse, get medical help right away. Call 911 if you experience symptoms such as worsening cough, trouble breathing, chest pain that doesn't go away, confusion, a hard time staying awake, and pale or blue-colored skin. This medication won't prevent all COVID-19 cases from getting worse.     Follow up:  Follow up if needed

## 2021-05-05 NOTE — Telephone Encounter (Signed)
Scheduled virtual appt for patient with Provider Tanda Rockers at [facility].

## 2021-05-07 ENCOUNTER — Encounter (HOSPITAL_COMMUNITY): Payer: Self-pay

## 2021-05-07 ENCOUNTER — Ambulatory Visit (INDEPENDENT_AMBULATORY_CARE_PROVIDER_SITE_OTHER): Payer: 59

## 2021-05-07 ENCOUNTER — Other Ambulatory Visit: Payer: Self-pay

## 2021-05-07 ENCOUNTER — Ambulatory Visit (HOSPITAL_COMMUNITY)
Admission: EM | Admit: 2021-05-07 | Discharge: 2021-05-07 | Disposition: A | Discharge status: Home / Self Care | Payer: 59 | Attending: Internal Medicine | Admitting: Internal Medicine

## 2021-05-07 DIAGNOSIS — R0989 Other specified symptoms and signs involving the circulatory and respiratory systems: Secondary | ICD-10-CM | POA: Diagnosis not present

## 2021-05-07 DIAGNOSIS — J358 Other chronic diseases of tonsils and adenoids: Secondary | ICD-10-CM

## 2021-05-07 NOTE — ED Triage Notes (Signed)
Pt c/o being rx COVID tx after (+) result Wed. States on her 2nd dose she thinks the cap lodged in her throat. States she can feel it every time she swallows.

## 2021-05-07 NOTE — ED Provider Notes (Signed)
[facility]    CSN: [id number] Arrival date & time: 05/07/21  1252      History   Chief Complaint Chief Complaint  Patient presents with   throat situation    HPI Kristi Cisneros is a 55 y.o. female throat dyscomfort after taking Covid capsule that felt it got stuck since she took it, but feels it in the center of her throat. She also saw and feels something on her R throat. She talked with her PCP today and she was told to come to urgent care to have xray to see if there is  a FB present. She denies pain or fever.     Past Medical History:  Diagnosis Date   Arthritis    GERD (gastroesophageal reflux disease)    Hyperlipidemia    Hypertension Dx 2015   Pre-diabetes    Smoker    1/2 ppd    Patient Active Problem List   Diagnosis Date Noted   Impingement syndrome of left shoulder 03/24/2021   Family history of breast cancer 03/18/2020   Other insomnia 03/18/2020   Class 2 severe obesity due to excess calories with serious comorbidity in adult (HCC) 03/18/2020   Prediabetes 07/20/2019   GERD (gastroesophageal reflux disease) 04/05/2015   Vitamin D deficiency 11/23/2014   Bilateral hand numbness 11/20/2014   Subcutaneous nodule of chest wall 11/20/2014   Left shoulder pain 11/20/2014   Menorrhagia 09/03/2014   Patellofemoral arthritis of right knee 08/19/2014   Right knee pain 05/22/2014   Right ankle pain 04/17/2014   Plantar fasciitis, bilateral 03/27/2014   Achilles tendonitis 03/27/2014   HLD (hyperlipidemia) 03/06/2014   Current smoker 03/04/2014   Hypertension     Past Surgical History:  Procedure Laterality Date   KNEE ARTHROSCOPY Right 01/26/2015   Procedure: ARTHROSCOPY KNEE, DEBRIDEMENT;  Surgeon: Cammy Copa, MD;  Location: [facility];  Service: Orthopedics;  Laterality: Right;   SHOULDER ARTHROSCOPY WITH DISTAL CLAVICLE RESECTION Left 03/24/2021   Procedure: LEFT SHOULDER ARTHROSCOPY WITH DEBRIDEMENT AND DISTAL CLAVICLE RESECTION;   Surgeon: Kathryne Hitch, MD;  Location: [facility];  Service: Orthopedics;  Laterality: Left;    OB History   No obstetric history on file.      Home Medications    Prior to Admission medications   Medication Sig Start Date End Date Taking? Authorizing Provider  atorvastatin (LIPITOR) 40 MG tablet Take 1.5 tablets (60 mg total) by mouth daily. 01/11/21   Marcine Matar, MD  fexofenadine (ALLEGRA) 180 MG tablet Take 180 mg by mouth daily.    [provider]  ibuprofen (ADVIL) 800 MG tablet Take 1 tablet (800 mg total) by mouth every 8 (eight) hours as needed. 03/31/21   Kathryne Hitch, MD  lisinopril-hydrochlorothiazide (ZESTORETIC) 20-25 MG tablet TAKE 1 TABLET BY MOUTH DAILY. 01/11/21   Marcine Matar, MD  molnupiravir EUA (LAGEVRIO) 200 mg CAPS capsule Take 4 capsules (800 mg total) by mouth 2 (two) times daily for 5 days. 05/05/21 05/10/21  Ivonne Andrew, NP  Multiple Vitamins-Minerals (CENTRUM ADULTS) TABS Take by mouth.    [provider]  oxyCODONE (OXY IR/ROXICODONE) 5 MG immediate release tablet Take 1 tablet (5 mg total) by mouth every 6 (six) hours as needed (for pain score of 1-4). 03/31/21   Kathryne Hitch, MD  pantoprazole (PROTONIX) 40 MG tablet Take 1 tablet (40 mg total) by mouth daily. 01/11/21   Marcine Matar, MD  traZODone (DESYREL) 50 MG  tablet TAKE 0.5-1 TABLETS (25-50 MG TOTAL) BY MOUTH AT BEDTIME AS NEEDED FOR SLEEP. 01/11/21 01/11/22  Marcine Matar, MD    Family History Family History  Problem Relation Age of Onset   Hypertension Mother    Diabetes Mother    Hypertension Brother    Breast cancer Sister    Breast cancer Maternal Aunt     Social History Social History   Tobacco Use   Smoking status: Every Day    Packs/day: 0.50    Years: 20.00    Pack years: 10.00    Types: Cigarettes   Smokeless tobacco: Never  Substance Use Topics   Alcohol use: Yes    Comment: socially-  ocassional   Drug use: No     Allergies   Levaquin [levofloxacin in d5w]   Review of Systems Review of Systems  HENT:  Negative for sore throat and trouble swallowing.   FB sensation in throat, + white matter on R tonsil  Physical Exam Triage Vital Signs ED Triage Vitals [05/07/21 1340]  Enc Vitals Group     BP 126/86     Pulse Rate (!) 108     Resp 18     Temp 97.9 F (36.6 C)     Temp Source Oral     SpO2 98 %     Weight      Height      Head Circumference      Peak Flow      Pain Score 0     Pain Loc      Pain Edu?      Excl. in GC?    No data found.  Updated Vital Signs BP 126/86 (BP Location: Left Arm)    Pulse (!) 108    Temp 97.9 F (36.6 C) (Oral)    Resp 18    SpO2 98%   Visual Acuity Right Eye Distance:   Left Eye Distance:   Bilateral Distance:    Right Eye Near:   Left Eye Near:    Bilateral Near:     Physical Exam Constitutional:      General: She is not in acute distress.    Appearance: She is obese. She is not toxic-appearing.  HENT:     Right Ear: External ear normal.     Left Ear: External ear normal.     Mouth/Throat:     Comments: R tonsil with a while tonsil stone.  Eyes:     General: No scleral icterus.    Conjunctiva/sclera: Conjunctivae normal.  Musculoskeletal:     Cervical back: Neck supple. No tenderness.  Lymphadenopathy:     Cervical: No cervical adenopathy.  Neurological:     Mental Status: She is alert and oriented to person, place, and time.     Gait: Gait normal.  Psychiatric:        Mood and Affect: Mood normal.        Behavior: Behavior normal.        Thought Content: Thought content normal.        Judgment: Judgment normal.     UC Treatments / Results  Labs (all labs ordered are listed, but only abnormal results are displayed) Labs Reviewed - No data to display  EKG   Radiology No results found.  Procedures Procedures (including critical care time)  Medications Ordered in UC Medications - No  data to display  Initial Impression / Assessment and Plan / UC Course  I have reviewed the triage  vital signs and the nursing notes. Pertinent imaging results that were available during my care of the patient were reviewed by me and considered in my medical decision making (see chart for details). See instructions.     Final Clinical Impressions(s) / UC Diagnoses   Final diagnoses:  Tonsil stone  Foreign body sensation in throat     Discharge Instructions      Your xray does not show any foreign body Gargle with warm walt water 3-4 times a day to dislodge that white matter on your right throat Follow up with ear nose and throat doctor if your symptoms persist Monday next week.      ED Prescriptions   None    PDMP not reviewed this encounter.   Shelby Mattocks, PA-C 05/07/21 1527

## 2021-05-07 NOTE — Discharge Instructions (Addendum)
Your xray does not show any foreign body Gargle with warm walt water 3-4 times a day to dislodge that white matter on your right throat Follow up with ear nose and throat doctor if your symptoms persist Monday next week.

## 2021-05-10 ENCOUNTER — Encounter: Payer: Self-pay | Admitting: Internal Medicine

## 2021-05-11 ENCOUNTER — Ambulatory Visit (INDEPENDENT_AMBULATORY_CARE_PROVIDER_SITE_OTHER): Payer: 59 | Admitting: Orthopaedic Surgery

## 2021-05-11 ENCOUNTER — Encounter: Payer: Self-pay | Admitting: Physician Assistant

## 2021-05-11 ENCOUNTER — Ambulatory Visit: Payer: 59 | Attending: Physician Assistant | Admitting: Physician Assistant

## 2021-05-11 ENCOUNTER — Encounter: Payer: Self-pay | Admitting: Orthopaedic Surgery

## 2021-05-11 ENCOUNTER — Other Ambulatory Visit: Payer: Self-pay

## 2021-05-11 VITALS — BP 138/82 | HR 97 | Resp 16 | Wt 251.2 lb

## 2021-05-11 DIAGNOSIS — Z09 Encounter for follow-up examination after completed treatment for conditions other than malignant neoplasm: Secondary | ICD-10-CM | POA: Diagnosis not present

## 2021-05-11 DIAGNOSIS — G8929 Other chronic pain: Secondary | ICD-10-CM

## 2021-05-11 DIAGNOSIS — J069 Acute upper respiratory infection, unspecified: Secondary | ICD-10-CM | POA: Diagnosis not present

## 2021-05-11 DIAGNOSIS — M25512 Pain in left shoulder: Secondary | ICD-10-CM

## 2021-05-11 DIAGNOSIS — U099 Post covid-19 condition, unspecified: Secondary | ICD-10-CM | POA: Diagnosis not present

## 2021-05-11 DIAGNOSIS — Z9889 Other specified postprocedural states: Secondary | ICD-10-CM

## 2021-05-11 MED ORDER — AZITHROMYCIN 250 MG PO TABS
ORAL_TABLET | ORAL | 0 refills | Status: AC
  Filled 2021-05-11: qty 6, 5d supply, fill #0

## 2021-05-11 MED ORDER — FLUCONAZOLE 150 MG PO TABS
150.0000 mg | ORAL_TABLET | Freq: Once | ORAL | 0 refills | Status: AC
Start: 2021-05-11 — End: 2021-05-13
  Filled 2021-05-11: qty 2, 2d supply, fill #0

## 2021-05-11 NOTE — Progress Notes (Signed)
The patient is now between 6 and 7 weeks status post a left shoulder arthroscopy with subacromial decompression.  She is back to work and says she is doing well.  She is getting better every day she states and still little sore.  Her shoulder exam is much better than what has been her last 2 visits.  She has better range of motion overall and her pain is minimal.  She should continue to improve with time.  My next step would be to consider a steroid injection in the subacromial outlet if she is having pain in 1 to 2 months from now.  Follow-up can be as needed.  All questions and concerns were answered and addressed.

## 2021-05-11 NOTE — Progress Notes (Signed)
Patient ID: Kristi Cisneros, female   DOB: Jan 20, 1966, 55 y.o.   MRN: [id number]   Kristi Cisneros, is a 55 y.o. female  BJS:[id number]  YWV:[id number]  DOB - 1965/06/22  Chief Complaint  Patient presents with   [name]       Subjective:   Kristi Cisneros is a 55 y.o. female here today for Tested positive for covid 8 days ago. Seen at ED 12/24 for FB sensation in throat.  Still has an uncomfortable sensation in her throat and "blisters" that she thinks is thrush, although she has never had thrush. Throat is now aggravating and not painful.  Appetite is improving.  Still with cough and discolored mucus.  Now all of her family members are also testing positive for covid.     No problems updated.  ALLERGIES: Allergies  Allergen Reactions   Levaquin [Levofloxacin In D5w] Other (See Comments)    "makes me feel Weird"    PAST MEDICAL HISTORY: Past Medical History:  Diagnosis Date   Arthritis    GERD (gastroesophageal reflux disease)    Hyperlipidemia    Hypertension Dx 2015   Pre-diabetes    Smoker    1/2 ppd    MEDICATIONS AT HOME: Prior to Admission medications   Medication Sig Start Date End Date Taking? Authorizing Provider  atorvastatin (LIPITOR) 40 MG tablet Take 1.5 tablets (60 mg total) by mouth daily. 01/11/21  Yes Marcine Matar, MD  azithromycin (ZITHROMAX) 250 MG tablet Take 2 tablets on day 1, then 1 tablet daily on days 2 through 5 05/11/21 05/16/21 Yes Araseli Sherry, Marzella Schlein, PA-C  fexofenadine (ALLEGRA) 180 MG tablet Take 180 mg by mouth daily.   Yes [provider]  fluconazole (DIFLUCAN) 150 MG tablet Take 1 tablet (150 mg total) by mouth once for 1 dose. 05/11/21 05/13/21 Yes Dacota Ruben, Marzella Schlein, PA-C  lisinopril-hydrochlorothiazide (ZESTORETIC) 20-25 MG tablet TAKE 1 TABLET BY MOUTH DAILY. 01/11/21  Yes Marcine Matar, MD  Multiple Vitamins-Minerals (CENTRUM ADULTS) TABS Take by mouth.   Yes [provider]  pantoprazole (PROTONIX) 40 MG tablet  Take 1 tablet (40 mg total) by mouth daily. 01/11/21  Yes Marcine Matar, MD  traZODone (DESYREL) 50 MG tablet TAKE 0.5-1 TABLETS (25-50 MG TOTAL) BY MOUTH AT BEDTIME AS NEEDED FOR SLEEP. 01/11/21 01/11/22 Yes Marcine Matar, MD  ibuprofen (ADVIL) 800 MG tablet Take 1 tablet (800 mg total) by mouth every 8 (eight) hours as needed. Patient not taking: Reported on 05/11/2021 03/31/21   Kathryne Hitch, MD  oxyCODONE (OXY IR/ROXICODONE) 5 MG immediate release tablet Take 1 tablet (5 mg total) by mouth every 6 (six) hours as needed (for pain score of 1-4). Patient not taking: Reported on 05/11/2021 03/31/21   Kathryne Hitch, MD    ROS: Neg HEENT Neg resp Neg cardiac Neg GI Neg GU Neg MS Neg psych Neg neuro  Objective:   Vitals:   05/11/21 1104  BP: 138/82  Pulse: 97  Resp: 16  SpO2: 94%  Weight: 251 lb 3.2 oz (113.9 kg)   Exam General appearance : Awake, alert, not in any distress. Speech Clear. Not toxic looking HEENT: Atraumatic and Normocephalic, TM clear B,  Throat-post pharynx has blisters and erythema, uvula is midline Neck: Supple, no JVD. No cervical lymphadenopathy.  Chest: Good air entry bilaterally, CTAB.  No rales/rhonchi/wheezing CVS: S1 S2 regular, no murmurs.  Extremities: B/L Lower Ext shows no edema, both legs are warm to touch Neurology: Awake alert, and  oriented X 3, CN II-XII intact, Non focal Skin: No Rash  Data Review Lab Results  Component Value Date   HGBA1C 5.9 (H) 03/18/2020   HGBA1C 5.8 (H) 06/23/2019    Assessment & Plan   1. Post covid-19 condition, unspecified Will cover for atypicals.  Lesions in throat likely covid related.  Agree with ED-salt water gargles tid which she has not been doing.    2. Upper respiratory tract infection, unspecified type Cover for atypicals post covid - azithromycin (ZITHROMAX) 250 MG tablet; Take 2 tablets on day 1, then 1 tablet daily on days 2 through 5  Dispense: 6 tablet; Refill: 0 -  fluconazole (DIFLUCAN) 150 MG tablet; Take 1 tablet (150 mg total) by mouth once for 1 dose.  Dispense: 2 tablet; Refill: 0   I had her cancel an appt that she had for tomorrow with her PCP bc she still has symptoms and all of her family members now have covid.  See PCP in about 7 weeks   Patient have been counseled extensively about nutrition and exercise. Other issues discussed during this visit include: low cholesterol diet, weight control and daily exercise, foot care, annual eye examinations at Ophthalmology, importance of adherence with medications and regular follow-up. We also discussed long term complications of uncontrolled diabetes and hypertension.   Return in about 7 weeks (around 06/29/2021) for cancle tomorrow appt and reschedule for mid february with Dr Laural Benes.  The patient was given clear instructions to go to ER or return to medical center if symptoms don't improve, worsen or new problems develop. The patient verbalized understanding. The patient was told to call to get lab results if they haven't heard anything in the next week.      Georgian Co, PA-C [facility] [place], [place] [phone]   05/11/2021, 11:40 AM

## 2021-05-12 ENCOUNTER — Ambulatory Visit: Payer: 59 | Admitting: Internal Medicine

## 2021-05-20 ENCOUNTER — Other Ambulatory Visit: Payer: Self-pay

## 2021-05-21 ENCOUNTER — Other Ambulatory Visit: Payer: Self-pay

## 2021-05-27 ENCOUNTER — Other Ambulatory Visit: Payer: Self-pay

## 2021-05-31 ENCOUNTER — Other Ambulatory Visit: Payer: Self-pay

## 2021-06-01 ENCOUNTER — Other Ambulatory Visit: Payer: Self-pay

## 2021-07-08 ENCOUNTER — Ambulatory Visit: Payer: 59 | Admitting: Internal Medicine

## 2021-07-18 ENCOUNTER — Ambulatory Visit: Payer: 59 | Admitting: Internal Medicine

## 2021-08-31 ENCOUNTER — Other Ambulatory Visit: Payer: Self-pay

## 2021-08-31 ENCOUNTER — Other Ambulatory Visit: Payer: Self-pay | Admitting: Internal Medicine

## 2021-08-31 DIAGNOSIS — E7800 Pure hypercholesterolemia, unspecified: Secondary | ICD-10-CM

## 2021-08-31 DIAGNOSIS — K219 Gastro-esophageal reflux disease without esophagitis: Secondary | ICD-10-CM

## 2021-08-31 DIAGNOSIS — I10 Essential (primary) hypertension: Secondary | ICD-10-CM

## 2021-08-31 MED ORDER — PANTOPRAZOLE SODIUM 40 MG PO TBEC
40.0000 mg | DELAYED_RELEASE_TABLET | Freq: Every day | ORAL | 1 refills | Status: DC
  Filled 2021-08-31: qty 90, 90d supply, fill #0
  Filled 2021-11-23: qty 30, 30d supply, fill #1
  Filled 2021-12-17 – 2022-01-02 (×2): qty 30, 30d supply, fill #2
  Filled 2022-02-03 – 2022-02-07 (×2): qty 30, 30d supply, fill #3

## 2021-08-31 MED ORDER — LISINOPRIL-HYDROCHLOROTHIAZIDE 20-25 MG PO TABS
1.0000 | ORAL_TABLET | Freq: Every day | ORAL | 0 refills | Status: DC
  Filled 2021-08-31: qty 90, 90d supply, fill #0

## 2021-08-31 MED ORDER — ATORVASTATIN CALCIUM 40 MG PO TABS
60.0000 mg | ORAL_TABLET | Freq: Every day | ORAL | 1 refills | Status: DC
  Filled 2021-08-31: qty 45, 30d supply, fill #0
  Filled 2021-11-23: qty 45, 30d supply, fill #1

## 2021-08-31 NOTE — Telephone Encounter (Signed)
Requested medication (s) are due for refill today: yes ? ?Requested medication (s) are on the active medication list: yes ? ?Last refill:  01/11/21 #135 1 refill ? ?Future visit scheduled: yes in 1 month ? ?Notes to clinic:  protocol failed. Last labs 03/18/20 do you want to refill Rx? ? ? ?  ?Requested Prescriptions  ?Pending Prescriptions Disp Refills  ? atorvastatin (LIPITOR) 40 MG tablet 135 tablet 1  ?  Sig: Take 1.5 tablets (60 mg total) by mouth daily.  ?  ? Cardiovascular:  Antilipid - Statins Failed - 08/31/2021  9:10 AM  ?  ?  Failed - Lipid Panel in normal range within the last 12 months  ?  Cholesterol, Total  ?Date Value Ref Range Status  ?03/18/2020 222 (H) 100 - 199 mg/dL Final  ? ?LDL Chol Calc (NIH)  ?Date Value Ref Range Status  ?03/18/2020 124 (H) 0 - 99 mg/dL Final  ? ?HDL  ?Date Value Ref Range Status  ?03/18/2020 55 >39 mg/dL Final  ? ?Triglycerides  ?Date Value Ref Range Status  ?03/18/2020 246 (H) 0 - 149 mg/dL Final  ? ?  ?  ?  Passed - Patient is not pregnant  ?  ?  Passed - Valid encounter within last 12 months  ?  Recent Outpatient Visits   ? ?      ? 3 months ago Post covid-19 condition, unspecified  ? [place], [place], [place]  ? 3 months ago COVID-19  ? Primary Care at [facility], Kriste Basque, NP  ? 7 months ago Essential hypertension  ? [place] Ladell Pier, MD  ? 1 year ago Essential hypertension  ? [place] Ladell Pier, MD  ? 1 year ago Pure hypercholesterolemia  ? [place], [place], [place]  ? ?  ?  ?Future Appointments   ? ?        ? In 1 month Ladell Pier, MD [place]  ? ?  ? ? ?  ?  ?  ?Signed Prescriptions Disp Refills  ? lisinopril-hydrochlorothiazide (ZESTORETIC) 20-25 MG tablet 90 tablet 0  ?  Sig: TAKE 1 TABLET BY MOUTH DAILY.  ?  ? Cardiovascular:  ACEI + Diuretic Combos  Passed - 08/31/2021  9:10 AM  ?  ?  Passed - Na in normal range and within 180 days  ?  Sodium  ?Date Value Ref Range Status  ?03/22/2021 137 135 - 145 mmol/L Final  ?06/23/2019 139 134 - 144 mmol/L Final  ?  ?  ?  ?  Passed - K in normal range and within 180 days  ?  Potassium  ?Date Value Ref Range Status  ?03/22/2021 4.0 3.5 - 5.1 mmol/L Final  ?  ?  ?  ?  Passed - Cr in normal range and within 180 days  ?  Creat  ?Date Value Ref Range Status  ?05/23/2016 0.76 0.50 - 1.10 mg/dL Final  ? ?Creatinine, Ser  ?Date Value Ref Range Status  ?03/22/2021 0.76 0.44 - 1.00 mg/dL Final  ?  ?  ?  ?  Passed - eGFR is 30 or above and within 180 days  ?  GFR, Est African American  ?Date Value Ref Range Status  ?05/23/2016 >89 >=60 mL/min Final  ? ?GFR calc Af Amer  ?Date Value Ref Range Status  ?06/23/2019  87 >59 mL/min/1.73 Final  ? ?GFR, Est Non African American  ?Date Value Ref Range Status  ?05/23/2016 >89 >=60 mL/min Final  ? ?GFR, Estimated  ?Date Value Ref Range Status  ?03/22/2021 >60 >60 mL/min Final  ?  Comment:  ?  (NOTE) ?Calculated using the CKD-EPI Creatinine Equation (2021) ?  ?  ?  ?  ?  Passed - Patient is not pregnant  ?  ?  Passed - Last BP in normal range  ?  BP Readings from Last 1 Encounters:  ?05/11/21 138/82  ?  ?  ?  ?  Passed - Valid encounter within last 6 months  ?  Recent Outpatient Visits   ? ?      ? 3 months ago Post covid-19 condition, unspecified  ? [place], [place], [place]  ? 3 months ago COVID-19  ? Primary Care at [facility], Kriste Basque, NP  ? 7 months ago Essential hypertension  ? [place] Ladell Pier, MD  ? 1 year ago Essential hypertension  ? [place] Ladell Pier, MD  ? 1 year ago Pure hypercholesterolemia  ? [place], [place], [place]  ? ?  ?  ?Future Appointments   ? ?        ? In 1 month Ladell Pier, MD [place]  ? ?  ? ? ?  ?  ?  ? pantoprazole (PROTONIX) 40 MG tablet 90 tablet 1  ?  Sig: Take 1 tablet (40 mg total) by mouth daily.  ?  ? Gastroenterology: Proton Pump Inhibitors Passed - 08/31/2021  9:10 AM  ?  ?  Passed - Valid encounter within last 12 months  ?  Recent Outpatient Visits   ? ?      ? 3 months ago Post covid-19 condition, unspecified  ? [place], [place], [place]  ? 3 months ago COVID-19  ? [facility] Care at [facility], Kriste Basque, NP  ? 7 months ago Essential hypertension  ? [place] Ladell Pier, MD  ? 1 year ago Essential hypertension  ? [place] Ladell Pier, MD  ? 1 year ago Pure hypercholesterolemia  ? [place], [place], [place]  ? ?  ?  ?Future Appointments   ? ?        ? In 1 month Ladell Pier, MD [place]  ? ?  ? ? ?  ?  ?  ? ?

## 2021-08-31 NOTE — Telephone Encounter (Signed)
Medication Refill - Medication:  ?lisinopril-hydrochlorothiazide (ZESTORETIC) 20-25 MG tablet  ?pantoprazole (PROTONIX) 40 MG tablet  ?atorvastatin (LIPITOR) 40 MG tablet ? ?Has the patient contacted their pharmacy? Yes.   ?Contact PCP ? ?Preferred Pharmacy (with phone number or street name):  ?[facility]  ?[address], [place] [address]  ?Phone:  [phone]  Fax:  [phone]  ? ?Has the patient been seen for an appointment in the last year OR does the patient have an upcoming appointment? Yes.   ? ?Agent: Please be advised that RX refills may take up to 3 business days. We ask that you follow-up with your pharmacy. ?

## 2021-08-31 NOTE — Telephone Encounter (Signed)
Requested Prescriptions  ?Pending Prescriptions Disp Refills  ?? lisinopril-hydrochlorothiazide (ZESTORETIC) 20-25 MG tablet 90 tablet 0  ?  Sig: TAKE 1 TABLET BY MOUTH DAILY.  ?  ? Cardiovascular:  ACEI + Diuretic Combos Passed - 08/31/2021  9:10 AM  ?  ?  Passed - Na in normal range and within 180 days  ?  Sodium  ?Date Value Ref Range Status  ?03/22/2021 137 135 - 145 mmol/L Final  ?06/23/2019 139 134 - 144 mmol/L Final  ?   ?  ?  Passed - K in normal range and within 180 days  ?  Potassium  ?Date Value Ref Range Status  ?03/22/2021 4.0 3.5 - 5.1 mmol/L Final  ?   ?  ?  Passed - Cr in normal range and within 180 days  ?  Creat  ?Date Value Ref Range Status  ?05/23/2016 0.76 0.50 - 1.10 mg/dL Final  ? ?Creatinine, Ser  ?Date Value Ref Range Status  ?03/22/2021 0.76 0.44 - 1.00 mg/dL Final  ?   ?  ?  Passed - eGFR is 30 or above and within 180 days  ?  GFR, Est African American  ?Date Value Ref Range Status  ?05/23/2016 >89 >=60 mL/min Final  ? ?GFR calc Af Amer  ?Date Value Ref Range Status  ?06/23/2019 87 >59 mL/min/1.73 Final  ? ?GFR, Est Non African American  ?Date Value Ref Range Status  ?05/23/2016 >89 >=60 mL/min Final  ? ?GFR, Estimated  ?Date Value Ref Range Status  ?03/22/2021 >60 >60 mL/min Final  ?  Comment:  ?  (NOTE) ?Calculated using the CKD-EPI Creatinine Equation (2021) ?  ?   ?  ?  Passed - Patient is not pregnant  ?  ?  Passed - Last BP in normal range  ?  BP Readings from Last 1 Encounters:  ?05/11/21 138/82  ?   ?  ?  Passed - Valid encounter within last 6 months  ?  Recent Outpatient Visits   ?      ? 3 months ago Post covid-19 condition, unspecified  ? Kristi Cisneros, [place], [place]  ? 3 months ago COVID-19  ? [facility] Care at [facility], Kriste Basque, NP  ? 7 months ago Essential hypertension  ? [place] Ladell Pier, MD  ? 1 year ago Essential hypertension  ? [place] Ladell Pier, MD  ? 1 year ago Pure hypercholesterolemia  ? [place], [place], [place]  ?  ?  ?Future Appointments   ?        ? In 1 month Ladell Pier, MD [place]  ?  ? ?  ?  ?  ?? pantoprazole (PROTONIX) 40 MG tablet 90 tablet 1  ?  Sig: Take 1 tablet (40 mg total) by mouth daily.  ?  ? Gastroenterology: Proton Pump Inhibitors Passed - 08/31/2021  9:10 AM  ?  ?  Passed - Valid encounter within last 12 months  ?  Recent Outpatient Visits   ?      ? 3 months ago Post covid-19 condition, unspecified  ? [place], [place], [place]  ? 3 months ago COVID-19  ? Primary Care at [facility], Kriste Basque, NP  ? 7 months ago Essential hypertension  ? [place]  Ladell Pier, MD  ? 1 year ago Essential hypertension  ? [place] Ladell Pier, MD  ? 1 year ago Pure hypercholesterolemia  ? [place], [place], [place]  ?  ?  ?Future Appointments   ?        ? In 1 month Ladell Pier, MD [place]  ?  ? ?  ?  ?  ?? atorvastatin (LIPITOR) 40 MG tablet 135 tablet 1  ?  Sig: Take 1.5 tablets (60 mg total) by mouth daily.  ?  ? Cardiovascular:  Antilipid - Statins Failed - 08/31/2021  9:10 AM  ?  ?  Failed - Lipid Panel in normal range within the last 12 months  ?  Cholesterol, Total  ?Date Value Ref Range Status  ?03/18/2020 222 (H) 100 - 199 mg/dL Final  ? ?LDL Chol Calc (NIH)  ?Date Value Ref Range Status  ?03/18/2020 124 (H) 0 - 99 mg/dL Final  ? ?HDL  ?Date Value Ref Range Status  ?03/18/2020 55 >39 mg/dL Final  ? ?Triglycerides  ?Date Value Ref Range Status  ?03/18/2020 246 (H) 0 - 149 mg/dL Final  ? ?  ?  ?  Passed - Patient is not pregnant  ?  ?  Passed - Valid encounter within last 12 months  ?  Recent Outpatient Visits   ?      ? 3 months ago Post covid-19  condition, unspecified  ? [place], [place], [place]  ? 3 months ago COVID-19  ? Primary Care at [facility], Kriste Basque, NP  ? 7 months ago Essential hypertension  ? [place] Ladell Pier, MD  ? 1 year ago Essential hypertension  ? [place] Ladell Pier, MD  ? 1 year ago Pure hypercholesterolemia  ? [place], [place], [place]  ?  ?  ?Future Appointments   ?        ? In 1 month Ladell Pier, MD [place]  ?  ? ?  ?  ?  ? ?

## 2021-09-01 ENCOUNTER — Other Ambulatory Visit: Payer: Self-pay

## 2021-09-16 ENCOUNTER — Telehealth: Payer: Self-pay | Admitting: Orthopaedic Surgery

## 2021-09-16 NOTE — Telephone Encounter (Signed)
Lmom for pt to return call to sch an appt with Dr. Magnus Ivan for shoulder pain ?

## 2021-10-03 ENCOUNTER — Ambulatory Visit (INDEPENDENT_AMBULATORY_CARE_PROVIDER_SITE_OTHER): Payer: Self-pay | Admitting: Physician Assistant

## 2021-10-03 ENCOUNTER — Encounter: Payer: Self-pay | Admitting: Physician Assistant

## 2021-10-03 DIAGNOSIS — Z9889 Other specified postprocedural states: Secondary | ICD-10-CM

## 2021-10-03 DIAGNOSIS — M25512 Pain in left shoulder: Secondary | ICD-10-CM

## 2021-10-03 DIAGNOSIS — G8929 Other chronic pain: Secondary | ICD-10-CM

## 2021-10-03 MED ORDER — LIDOCAINE HCL 1 % IJ SOLN
3.0000 mL | INTRAMUSCULAR | Status: AC | PRN
  Administered 2021-10-03: 3 mL

## 2021-10-03 MED ORDER — METHYLPREDNISOLONE ACETATE 40 MG/ML IJ SUSP
40.0000 mg | INTRAMUSCULAR | Status: AC | PRN
  Administered 2021-10-03: 40 mg via INTRA_ARTICULAR

## 2021-10-03 NOTE — Progress Notes (Signed)
   Procedure Note  Patient: Kristi Cisneros             Date of Birth: 02-03-1966           MRN: [id number]             Visit Date: 10/03/2021 HPI: Patient returns today with left shoulder pain.  States her pain is better than it was prior to her left shoulder arthroscopy with distal clavicle resection.  She states she is just having pain at the end of the day and wants an injection.  Again her shoulder arthroscopy was on 03/24/2021.  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  She notes whenever she reaches across her chest with the left arm she has increased pain points to the anterior aspect of the shoulder.  She has had no new injury to the shoulder.  Denies any fevers or chills.  Review of systems: See HPI.  Physical exam: General well-developed well-nourished female no acute distress. Bilateral shoulder she has 5 out of 5 strength with external and internal rotation against resistance.  Negative impingement bilaterally.  Crossover test causes some discomfort left shoulder.  Full overhead activity both shoulders without significant pain.   Procedures: Visit Diagnoses:  1. Status post arthroscopy of left shoulder   2. Chronic left shoulder pain     Large Joint Inj: L subacromial bursa on 10/03/2021 11:32 AM Indications: pain Details: 22 G 1.5 in needle, superolateral approach  Arthrogram: No  Medications: 3 mL lidocaine 1 %; 40 mg methylPREDNISolone acetate 40 MG/ML Outcome: tolerated well, no immediate complications Procedure, treatment alternatives, risks and benefits explained, specific risks discussed. Consent was given by the patient. Immediately prior to procedure a time out was called to verify the correct patient, procedure, equipment, support staff and site/side marked as required. Patient was prepped and draped in the usual sterile fashion.    Plan: She will continue her shoulder exercises as shown in the past.  Pain continues or fails to  improve she will follow-up with [place].  Otherwise follow-up as needed.  Questions were encouraged and answered at length today.

## 2021-10-14 ENCOUNTER — Ambulatory Visit: Payer: 59 | Admitting: Internal Medicine

## 2021-11-23 ENCOUNTER — Other Ambulatory Visit: Payer: Self-pay | Admitting: Internal Medicine

## 2021-11-23 ENCOUNTER — Other Ambulatory Visit: Payer: Self-pay

## 2021-11-23 DIAGNOSIS — I10 Essential (primary) hypertension: Secondary | ICD-10-CM

## 2021-11-23 MED ORDER — LISINOPRIL-HYDROCHLOROTHIAZIDE 20-25 MG PO TABS
1.0000 | ORAL_TABLET | Freq: Every day | ORAL | 0 refills | Status: DC
  Filled 2021-11-23: qty 30, 30d supply, fill #0
  Filled 2021-12-17 – 2022-01-02 (×2): qty 30, 30d supply, fill #1
  Filled 2022-02-03 – 2022-02-07 (×2): qty 30, 30d supply, fill #2

## 2021-11-24 ENCOUNTER — Other Ambulatory Visit: Payer: Self-pay

## 2021-12-17 ENCOUNTER — Other Ambulatory Visit: Payer: Self-pay | Admitting: Internal Medicine

## 2021-12-17 DIAGNOSIS — E7800 Pure hypercholesterolemia, unspecified: Secondary | ICD-10-CM

## 2021-12-19 ENCOUNTER — Other Ambulatory Visit: Payer: Self-pay

## 2021-12-19 MED ORDER — ATORVASTATIN CALCIUM 40 MG PO TABS
60.0000 mg | ORAL_TABLET | Freq: Every day | ORAL | 0 refills | Status: DC
  Filled 2021-12-19 – 2022-01-02 (×2): qty 45, 30d supply, fill #0

## 2021-12-26 ENCOUNTER — Other Ambulatory Visit: Payer: Self-pay

## 2022-01-03 ENCOUNTER — Other Ambulatory Visit: Payer: Self-pay

## 2022-01-23 ENCOUNTER — Ambulatory Visit: Payer: Self-pay | Admitting: Internal Medicine

## 2022-01-27 ENCOUNTER — Other Ambulatory Visit: Payer: Self-pay | Admitting: Internal Medicine

## 2022-01-27 DIAGNOSIS — E7800 Pure hypercholesterolemia, unspecified: Secondary | ICD-10-CM

## 2022-01-30 ENCOUNTER — Other Ambulatory Visit (HOSPITAL_COMMUNITY): Payer: Self-pay

## 2022-01-30 MED ORDER — ATORVASTATIN CALCIUM 40 MG PO TABS
60.0000 mg | ORAL_TABLET | Freq: Every day | ORAL | 0 refills | Status: DC
  Filled 2022-01-30 – 2022-02-07 (×2): qty 45, 30d supply, fill #0

## 2022-01-30 NOTE — Telephone Encounter (Signed)
Requested medication (s) are due for refill today: yes  Requested medication (s) are on the active medication list:yes  Last refill:  12/19/21  Future visit scheduled: yes  Notes to clinic:  Unable to refill per protocol, courtesy refill already given, routing for provider approval.      Requested Prescriptions  Pending Prescriptions Disp Refills   atorvastatin (LIPITOR) 40 MG tablet 45 tablet 0    Sig: Take 1.5 tablets (60 mg total) by mouth daily.     Cardiovascular:  Antilipid - Statins Failed - 01/27/2022  6:10 PM      Failed - Lipid Panel in normal range within the last 12 months    Cholesterol, Total  Date Value Ref Range Status  03/18/2020 222 (H) 100 - 199 mg/dL Final   LDL Chol Calc (NIH)  Date Value Ref Range Status  03/18/2020 124 (H) 0 - 99 mg/dL Final   HDL  Date Value Ref Range Status  03/18/2020 55 >39 mg/dL Final   Triglycerides  Date Value Ref Range Status  03/18/2020 246 (H) 0 - 149 mg/dL Final         Passed - Patient is not pregnant      Passed - Valid encounter within last 12 months    Recent Outpatient Visits           8 months ago Post covid-19 condition, unspecified   [facility], [place], [place]   9 months ago COVID-19   [facility], Kriste Basque, NP   1 year ago Essential hypertension   [place], Deborah B, MD   1 year ago Essential hypertension   [place], Deborah B, MD   2 years ago Pure hypercholesterolemia   [facility], Dionne Bucy, [place]       Future Appointments             In 3 weeks Ladell Pier, MD [place]

## 2022-02-03 ENCOUNTER — Other Ambulatory Visit (HOSPITAL_COMMUNITY): Payer: Self-pay

## 2022-02-07 ENCOUNTER — Other Ambulatory Visit: Payer: Self-pay

## 2022-02-17 ENCOUNTER — Other Ambulatory Visit (HOSPITAL_COMMUNITY): Payer: Self-pay

## 2022-02-21 ENCOUNTER — Encounter: Payer: Self-pay | Admitting: Internal Medicine

## 2022-02-21 ENCOUNTER — Ambulatory Visit: Payer: No Typology Code available for payment source | Attending: Internal Medicine | Admitting: Internal Medicine

## 2022-02-21 ENCOUNTER — Other Ambulatory Visit: Payer: Self-pay

## 2022-02-21 VITALS — BP 120/77 | HR 97 | Ht 68.0 in | Wt 253.8 lb

## 2022-02-21 DIAGNOSIS — Z23 Encounter for immunization: Secondary | ICD-10-CM

## 2022-02-21 DIAGNOSIS — I10 Essential (primary) hypertension: Secondary | ICD-10-CM

## 2022-02-21 DIAGNOSIS — Z6838 Body mass index (BMI) 38.0-38.9, adult: Secondary | ICD-10-CM

## 2022-02-21 DIAGNOSIS — E782 Mixed hyperlipidemia: Secondary | ICD-10-CM

## 2022-02-21 DIAGNOSIS — F17210 Nicotine dependence, cigarettes, uncomplicated: Secondary | ICD-10-CM | POA: Diagnosis not present

## 2022-02-21 DIAGNOSIS — F17200 Nicotine dependence, unspecified, uncomplicated: Secondary | ICD-10-CM

## 2022-02-21 DIAGNOSIS — Z1211 Encounter for screening for malignant neoplasm of colon: Secondary | ICD-10-CM

## 2022-02-21 DIAGNOSIS — K219 Gastro-esophageal reflux disease without esophagitis: Secondary | ICD-10-CM

## 2022-02-21 DIAGNOSIS — Z1231 Encounter for screening mammogram for malignant neoplasm of breast: Secondary | ICD-10-CM

## 2022-02-21 MED ORDER — ATORVASTATIN CALCIUM 40 MG PO TABS
60.0000 mg | ORAL_TABLET | Freq: Every day | ORAL | 6 refills | Status: DC
  Filled 2022-02-21 – 2022-03-16 (×3): qty 45, 30d supply, fill #0
  Filled 2022-03-30: qty 30, 30d supply, fill #0
  Filled 2022-04-26 – 2022-05-03 (×2): qty 30, 30d supply, fill #1
  Filled 2022-05-31: qty 30, 30d supply, fill #2
  Filled 2022-06-26: qty 30, 30d supply, fill #3
  Filled 2022-07-31: qty 30, 30d supply, fill #4
  Filled 2022-08-31: qty 30, 30d supply, fill #5
  Filled 2022-10-02: qty 30, 30d supply, fill #6
  Filled 2022-11-02: qty 30, 30d supply, fill #7
  Filled 2022-12-11 (×2): qty 30, 30d supply, fill #8
  Filled 2023-01-30: qty 30, 30d supply, fill #9

## 2022-02-21 MED ORDER — FAMOTIDINE 20 MG PO TABS
20.0000 mg | ORAL_TABLET | Freq: Two times a day (BID) | ORAL | 4 refills | Status: DC
  Filled 2022-02-21 – 2022-03-30 (×4): qty 60, 30d supply, fill #0
  Filled 2022-05-31: qty 60, 30d supply, fill #1
  Filled 2022-07-31: qty 60, 30d supply, fill #2
  Filled 2022-08-31: qty 60, 30d supply, fill #3
  Filled 2022-10-02: qty 60, 30d supply, fill #4

## 2022-02-21 MED ORDER — LISINOPRIL-HYDROCHLOROTHIAZIDE 20-25 MG PO TABS
1.0000 | ORAL_TABLET | Freq: Every day | ORAL | 1 refills | Status: DC
  Filled 2022-02-21 – 2022-03-30 (×4): qty 90, 90d supply, fill #0
  Filled 2022-06-26: qty 90, 90d supply, fill #1

## 2022-02-21 NOTE — Progress Notes (Signed)
Patient ID: Kristi Cisneros, female    DOB: [birth year]  MRN: [id number]  CC: Chronic disease management  Subjective: Kristi Cisneros is a 56 y.o. female who presents for chronic disease management Her concerns today include:  Patient with history of HTN, HL, preDM, GERD, tobacco dependence, CTS, fhx of breast CA in sister pos for BRCA gene  HYPERTENSION Currently taking: see medication list.  She is on lisinopril/HCTZ 20/25 mg 1 tablet daily. Med Adherence: $RemoveBefore'[x]'APaTSTrqFseTk$  Yes    '[]'$  No Medication side effects: $RemoveBefor'[]'NextPKcAGcMW$  Yes    '[x]'$  No Adherence with salt restriction: $RemoveBeforeDEI'[x]'ksVprZqjjumRcJfE$  Yes    '[]'$  No Home Monitoring?: $RemoveBeforeDEI'[x]'DDcaAzuvRnMCZpcu$  Yes    '[]'$  No Monitoring Frequency: 1x/wk Home BP results range:  SOB? $Rem'[]'Fgxc$  Yes    '[x]'$  No Chest Pain?: $RemoveBefo'[]'LvZmBHhlUKI$  Yes    '[x]'$  No Leg swelling?: $RemoveBefore'[]'ccpEYDSTQZTIC$  Yes    '[x]'$  No Headaches?: $RemoveBefore'[]'tQPvrhANclNjs$  Yes    '[x]'$  No Dizziness? $RemoveBefor'[]'FyeJyLnQfkYs$  Yes    '[x]'$  No Comments:   HL:  taking and tolerating Lipitor  GERD:  taking Protonix every morning.    Tob dep: 1/2 pk a day.  Does not smoke in house or car due to grand-bady.    Obesity:  up 22 lbs since I last saw her 12/2020.  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  HM:  over due for pap.  Agrees to schedule.  Due for MMG.  Will hold on Shingrix for now.  Due for colon cancer screening.  She prefers to do Cologuard test.  Patient Active Problem List   Diagnosis Date Noted   Impingement syndrome of left shoulder 03/24/2021   Family history of breast cancer 03/18/2020   Other insomnia 03/18/2020   Class 2 severe obesity due to excess calories with serious comorbidity in adult ([place]) 03/18/2020   Prediabetes 07/20/2019   GERD (gastroesophageal reflux disease) 04/05/2015   Vitamin D deficiency 11/23/2014   Bilateral hand numbness 11/20/2014   Subcutaneous nodule of chest wall 11/20/2014   Left shoulder pain 11/20/2014   Menorrhagia 09/03/2014   Patellofemoral arthritis of right knee 08/19/2014    Right knee pain 05/22/2014   Right ankle pain 04/17/2014   Plantar fasciitis, bilateral 03/27/2014   Achilles tendonitis 03/27/2014   HLD (hyperlipidemia) 03/06/2014   Current smoker 03/04/2014   Hypertension      Current Outpatient Medications on File Prior to Visit  Medication Sig Dispense Refill   atorvastatin (LIPITOR) 40 MG tablet Take 1.5 tablets (60 mg total) by mouth daily. 45 tablet 0   fexofenadine (ALLEGRA) 180 MG tablet Take 180 mg by mouth daily.     lisinopril-hydrochlorothiazide (ZESTORETIC) 20-25 MG tablet TAKE 1 TABLET BY MOUTH DAILY. 90 tablet 0   Multiple Vitamins-Minerals (CENTRUM ADULTS) TABS Take by mouth.     pantoprazole (PROTONIX) 40 MG tablet Take 1 tablet (40 mg total) by mouth daily. 90 tablet 1   ibuprofen (ADVIL) 800 MG tablet Take 1 tablet (800 mg total) by mouth every 8 (eight) hours as needed. (Patient not taking: Reported on 02/21/2022) 60 tablet 1   oxyCODONE (OXY IR/ROXICODONE) 5 MG immediate release tablet Take 1 tablet (5 mg total) by mouth every 6 (six) hours as needed (for pain score of 1-4). (Patient not taking: Reported on 02/21/2022) 30 tablet 0   traZODone (DESYREL) 50 MG tablet TAKE 0.5-1 TABLETS (25-50 MG TOTAL) BY MOUTH AT BEDTIME  AS NEEDED FOR SLEEP. 30 tablet 3   No current facility-administered medications on file prior to visit.    Allergies  Allergen Reactions   Levaquin [Levofloxacin In D5w] Other (See Comments)    "makes me feel Weird"    Social History   Socioeconomic History   Marital status: Single    Spouse name: Kandra Nicolas    Number of children: 1    Years of education: 12    Highest education level: Not on file  Occupational History   Occupation: Conservation officer, historic buildings: [employer]     Comment: Media planner   Tobacco Use   Smoking status: Every Day    Packs/day: 0.50    Years: 20.00    Total pack years: 10.00    Types: Cigarettes   Smokeless tobacco: Never  Substance and Sexual Activity    Alcohol use: Yes    Comment: socially- ocassional   Drug use: No   Sexual activity: Yes    Birth control/protection: None  Other Topics Concern   Not on file  Social History Narrative   Lives with Lennette Bihari (partner of 54 years) and 61 yo son.       Social Determinants of Health   Financial Resource Strain: Not on file  Food Insecurity: Not on file  Transportation Needs: Not on file  Physical Activity: Not on file  Stress: Not on file  Social Connections: Not on file  Intimate Partner Violence: Not on file    Family History  Problem Relation Age of Onset   Hypertension Mother    Diabetes Mother    Hypertension Brother    Breast cancer Sister    Breast cancer Maternal Aunt     Past Surgical History:  Procedure Laterality Date   KNEE ARTHROSCOPY Right 01/26/2015   Procedure: ARTHROSCOPY KNEE, DEBRIDEMENT;  Surgeon: Meredith Pel, MD;  Location: [facility];  Service: Orthopedics;  Laterality: Right;   SHOULDER ARTHROSCOPY WITH DISTAL CLAVICLE RESECTION Left 03/24/2021   Procedure: LEFT SHOULDER ARTHROSCOPY WITH DEBRIDEMENT AND DISTAL CLAVICLE RESECTION;  Surgeon: Mcarthur Rossetti, MD;  Location: [facility];  Service: Orthopedics;  Laterality: Left;    ROS: Review of Systems Negative except as stated above  PHYSICAL EXAM: BP 120/77   Pulse 97   Ht $R'5\' 8"'TY$  (1.727 m)   Wt 253 lb 12.8 oz (115.1 kg)   SpO2 (!) 70%   BMI 38.59 kg/m   Wt Readings from Last 3 Encounters:  02/21/22 253 lb 12.8 oz (115.1 kg)  05/11/21 251 lb 3.2 oz (113.9 kg)  03/24/21 247 lb 12.8 oz (112.4 kg)    Physical Exam  General appearance - alert, well appearing, middle-age obese Caucasian female and in no distress Mental status - normal mood, behavior, speech, dress, motor activity, and thought processes Neck - supple, no significant adenopathy Chest - clear to auscultation, no wheezes, rales or rhonchi, symmetric air entry Heart - normal rate, regular rhythm, normal S1, S2,  no murmurs, rubs, clicks or gallops Extremities - peripheral pulses normal, no pedal edema, no clubbing or cyanosis      Latest Ref Rng & Units 03/22/2021    1:13 PM 06/23/2019    4:40 PM 07/22/2018    4:39 PM  CMP  Glucose 70 - 99 mg/dL 133  168  83   BUN 6 - 20 mg/dL $Remove'18  18  14   'IHqwhpc$ Creatinine 0.44 - 1.00 mg/dL 0.76  0.89  0.77  Sodium 135 - 145 mmol/L 137  139  141   Potassium 3.5 - 5.1 mmol/L 4.0  3.8  4.2   Chloride 98 - 111 mmol/L 103  103  101   CO2 22 - 32 mmol/L $RemoveB'24  21  24   'EGXpCYkZ$ Calcium 8.9 - 10.3 mg/dL 10.1  9.6  9.7   Total Protein 6.0 - 8.5 g/dL  6.9  6.8   Total Bilirubin 0.0 - 1.2 mg/dL  0.4  0.6   Alkaline Phos 39 - 117 IU/L  83  87   AST 0 - 40 IU/L  19  14   ALT 0 - 32 IU/L  20  19    Lipid Panel     Component Value Date/Time   CHOL 222 (H) 03/18/2020 1642   TRIG 246 (H) 03/18/2020 1642   HDL 55 03/18/2020 1642   CHOLHDL 4.0 03/18/2020 1642   CHOLHDL 5.3 (H) 05/23/2016 1234   VLDL 62 (H) 05/23/2016 1234   LDLCALC 124 (H) 03/18/2020 1642    CBC    Component Value Date/Time   WBC 7.9 06/23/2019 1640   WBC 5.8 01/26/2015 1340   RBC 4.01 06/23/2019 1640   RBC 4.39 01/26/2015 1340   HGB 12.0 06/23/2019 1640   HCT 36.6 06/23/2019 1640   PLT 279 06/23/2019 1640   MCV 91 06/23/2019 1640   MCH 29.9 06/23/2019 1640   MCH 31.2 01/26/2015 1340   MCHC 32.8 06/23/2019 1640   MCHC 33.8 01/26/2015 1340   RDW 13.2 06/23/2019 1640   LYMPHSABS 2.5 06/13/2017 1636   EOSABS 0.1 06/13/2017 1636   BASOSABS 0.0 06/13/2017 1636    ASSESSMENT AND PLAN: 1. Essential hypertension At goal.  Continue lisinopril/hydrochlorothiazide. - CBC - Comprehensive metabolic panel - lisinopril-hydrochlorothiazide (ZESTORETIC) 20-25 MG tablet; TAKE 1 TABLET BY MOUTH DAILY.  Dispense: 90 tablet; Refill: 1  2. Tobacco dependence Advised to quit.  She is aware of health risks associated with smoking.  3. Class 2 severe obesity due to excess calories with serious comorbidity and body mass  index (BMI) of 38.0 to 38.9 in adult [facility]) Patient advised to eliminate sugary drinks from the diet, cut back on portion sizes especially of white carbohydrates, eat more white lean meat like chicken [place] and seafood instead of beef or pork and incorporate fresh fruits and vegetables into the diet daily. - Hemoglobin A1c  4. Mixed hyperlipidemia Continue atorvastatin 60 mg daily. - Lipid panel - atorvastatin (LIPITOR) 40 MG tablet; Take 1.5 tablets (60 mg total) by mouth daily.  Dispense: 45 tablet; Refill: 6  5. Gastroesophageal reflux disease without esophagitis Recommend that we try stepping her down to famotidine.  Patient agreeable to trial. - famotidine (PEPCID) 20 MG tablet; Take 1 tablet (20 mg total) by mouth 2 (two) times daily.  Dispense: 60 tablet; Refill: 4  6. Screening for colon cancer - Cologuard  7. Encounter for screening mammogram for malignant neoplasm of breast - MM Digital Screening; Future  8. Need for immunization against influenza - Flu Vaccine QUAD 77mo+IM (Fluarix, Fluzone & Alfiuria Quad PF)    Patient was given the opportunity to ask questions.  Patient verbalized understanding of the plan and was able to repeat key elements of the plan.   This documentation was completed using Radio producer.  Any transcriptional errors are unintentional.  No orders of the defined types were placed in this encounter.    Requested Prescriptions    No prescriptions requested or ordered in this  encounter    No follow-ups on file.  Karle Plumber, MD, FACP

## 2022-02-21 NOTE — Patient Instructions (Signed)

## 2022-02-22 LAB — COMPREHENSIVE METABOLIC PANEL
ALT: 34 IU/L — ABNORMAL HIGH (ref 0–32)
AST: 21 IU/L (ref 0–40)
Albumin/Globulin Ratio: 1.7 (ref 1.2–2.2)
Albumin: 4.5 g/dL (ref 3.8–4.9)
Alkaline Phosphatase: 96 IU/L (ref 44–121)
BUN/Creatinine Ratio: 18 (ref 9–23)
BUN: 16 mg/dL (ref 6–24)
Bilirubin Total: 0.5 mg/dL (ref 0.0–1.2)
CO2: 23 mmol/L (ref 20–29)
Calcium: 10 mg/dL (ref 8.7–10.2)
Chloride: 99 mmol/L (ref 96–106)
Creatinine, Ser: 0.89 mg/dL (ref 0.57–1.00)
Globulin, Total: 2.6 g/dL (ref 1.5–4.5)
Glucose: 86 mg/dL (ref 70–99)
Potassium: 3.9 mmol/L (ref 3.5–5.2)
Sodium: 140 mmol/L (ref 134–144)
Total Protein: 7.1 g/dL (ref 6.0–8.5)
eGFR: 77 mL/min/{1.73_m2} (ref >59)

## 2022-02-22 LAB — CBC
Hematocrit: 40.8 % (ref 34.0–46.6)
Hemoglobin: 13.8 g/dL (ref 11.1–15.9)
MCH: 32.2 pg (ref 26.6–33.0)
MCHC: 33.8 g/dL (ref 31.5–35.7)
MCV: 95 fL (ref 79–97)
Platelets: 271 10*3/uL (ref 150–450)
RBC: 4.28 x10E6/uL (ref 3.77–5.28)
RDW: 12.8 % (ref 11.7–15.4)
WBC: 7.9 10*3/uL (ref 3.4–10.8)

## 2022-02-22 LAB — LIPID PANEL
Chol/HDL Ratio: 3.5 ratio (ref 0.0–4.4)
Cholesterol, Total: 180 mg/dL (ref 100–199)
HDL: 52 mg/dL (ref >39)
LDL Chol Calc (NIH): 98 mg/dL (ref 0–99)
Triglycerides: 177 mg/dL — ABNORMAL HIGH (ref 0–149)
VLDL Cholesterol Cal: 30 mg/dL (ref 5–40)

## 2022-02-22 LAB — HEMOGLOBIN A1C
Est. average glucose Bld gHb Est-mCnc: 131 mg/dL
Hgb A1c MFr Bld: 6.2 % — ABNORMAL HIGH (ref 4.8–5.6)

## 2022-02-22 NOTE — Progress Notes (Signed)
Let patient know that her kidney and liver function tests are okay. Cholesterol levels have significantly improved compared to 1 year ago.  Continue the atorvastatin. She is still in the range for prediabetes.  Healthy eating habits and regular exercise as discussed on recent visit will prevent progression to full diabetes. Blood cell counts are normal.

## 2022-02-28 ENCOUNTER — Other Ambulatory Visit: Payer: Self-pay

## 2022-03-04 ENCOUNTER — Other Ambulatory Visit: Payer: Self-pay | Admitting: Internal Medicine

## 2022-03-04 ENCOUNTER — Other Ambulatory Visit (HOSPITAL_BASED_OUTPATIENT_CLINIC_OR_DEPARTMENT_OTHER): Payer: Self-pay

## 2022-03-04 ENCOUNTER — Other Ambulatory Visit (HOSPITAL_COMMUNITY): Payer: Self-pay

## 2022-03-04 DIAGNOSIS — G4709 Other insomnia: Secondary | ICD-10-CM

## 2022-03-06 ENCOUNTER — Other Ambulatory Visit (HOSPITAL_COMMUNITY): Payer: Self-pay

## 2022-03-06 MED ORDER — TRAZODONE HCL 50 MG PO TABS
25.0000 mg | ORAL_TABLET | Freq: Every evening | ORAL | 0 refills | Status: DC | PRN
  Filled 2022-03-06 – 2022-03-30 (×3): qty 90, 90d supply, fill #0

## 2022-03-08 ENCOUNTER — Encounter (HOSPITAL_COMMUNITY): Payer: Self-pay | Admitting: Pharmacist

## 2022-03-08 ENCOUNTER — Other Ambulatory Visit (HOSPITAL_COMMUNITY): Payer: Self-pay

## 2022-03-16 ENCOUNTER — Other Ambulatory Visit: Payer: Self-pay

## 2022-03-16 ENCOUNTER — Other Ambulatory Visit (HOSPITAL_COMMUNITY): Payer: Self-pay

## 2022-03-22 ENCOUNTER — Other Ambulatory Visit: Payer: Self-pay

## 2022-03-24 ENCOUNTER — Other Ambulatory Visit (HOSPITAL_COMMUNITY): Payer: Self-pay

## 2022-03-30 ENCOUNTER — Other Ambulatory Visit: Payer: Self-pay

## 2022-04-10 ENCOUNTER — Ambulatory Visit: Payer: No Typology Code available for payment source | Admitting: Internal Medicine

## 2022-04-24 ENCOUNTER — Ambulatory Visit: Payer: Self-pay | Admitting: Internal Medicine

## 2022-04-26 ENCOUNTER — Other Ambulatory Visit: Payer: Self-pay

## 2022-05-02 ENCOUNTER — Other Ambulatory Visit: Payer: Self-pay

## 2022-05-03 ENCOUNTER — Other Ambulatory Visit: Payer: Self-pay

## 2022-05-06 ENCOUNTER — Ambulatory Visit (HOSPITAL_COMMUNITY)
Admission: EM | Admit: 2022-05-06 | Discharge: 2022-05-06 | Disposition: A | Discharge status: Home / Self Care | Payer: No Typology Code available for payment source | Attending: Internal Medicine | Admitting: Internal Medicine

## 2022-05-06 ENCOUNTER — Encounter (HOSPITAL_COMMUNITY): Payer: Self-pay | Admitting: Emergency Medicine

## 2022-05-06 DIAGNOSIS — L0501 Pilonidal cyst with abscess: Secondary | ICD-10-CM | POA: Diagnosis not present

## 2022-05-06 MED ORDER — DOXYCYCLINE HYCLATE 100 MG PO CAPS: 100.0000 mg | ORAL_CAPSULE | Freq: Two times a day (BID) | ORAL | 0 refills | Status: AC

## 2022-05-06 NOTE — Discharge Instructions (Signed)
Take doxycycline antibiotic with breakfast and dinner (2 times a day) for the next 7 days to treat infection to the pilonidal abscess.  Your abscess is not ready to be drained today.  Please apply warm compresses and perform Epsom salt soaks to help with infection and make the abscess softer so that it is able to drain.  Please return to urgent care if your abscess becomes significantly more painful, red, or soft.   If you develop any new or worsening symptoms or do not improve in the next 2 to 3 days, please return.  If your symptoms are severe, please go to the emergency room.  Follow-up with your primary care provider for further evaluation and management of your symptoms as well as ongoing wellness visits.  I hope you feel better!

## 2022-05-06 NOTE — ED Triage Notes (Signed)
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 Reports re flared up on Wed. Hurts to sit, get up. Denies drainage.

## 2022-05-06 NOTE — ED Provider Notes (Signed)
[facility]    CSN: [id number] Arrival date & time: 05/06/22  1004      History   Chief Complaint Chief Complaint  Patient presents with   Abscess    HPI Kristi Cisneros is a 56 y.o. female.   Patient presents urgent care for evaluation of pain to the gluteal cleft and sensation of abscess formation that started on Wednesday, May 03, 2022 (3 days ago).  She has had a pilonidal abscess to this area in the past and believes that another pilonidal abscess may be forming.  It has been years since she has had the last abscess lanced but she was told at that time that this may happen again.  She denies drainage and redness to the area but states that the area has become mildly swollen over the last few days.  Tenderness increases every day and it is difficult for her to be able to sit on her buttocks without experiencing pain.  She also experiences pain to the area with walking.  No recent antibiotic use or trauma to the area.  She has been using Advil over-the-counter as needed for symptoms.  No fever, chills, or part palpitations.  Patient is a prediabetic but does not take any medications for this.   Abscess   Past Medical History:  Diagnosis Date   Arthritis    GERD (gastroesophageal reflux disease)    Hyperlipidemia    Hypertension Dx 2015   Pre-diabetes    Smoker    1/2 ppd    Patient Active Problem List   Diagnosis Date Noted   Impingement syndrome of left shoulder 03/24/2021   Family history of breast cancer 03/18/2020   Other insomnia 03/18/2020   Class 2 severe obesity due to excess calories with serious comorbidity in adult ([facility]) 03/18/2020   Prediabetes 07/20/2019   GERD (gastroesophageal reflux disease) 04/05/2015   Vitamin D deficiency 11/23/2014   Bilateral hand numbness 11/20/2014   Subcutaneous nodule of chest wall 11/20/2014   Left shoulder pain 11/20/2014   Menorrhagia 09/03/2014   Patellofemoral arthritis of right knee 08/19/2014    Right knee pain 05/22/2014   Right ankle pain 04/17/2014   Plantar fasciitis, bilateral 03/27/2014   Achilles tendonitis 03/27/2014   HLD (hyperlipidemia) 03/06/2014   Current smoker 03/04/2014   Hypertension     Past Surgical History:  Procedure Laterality Date   KNEE ARTHROSCOPY Right 01/26/2015   Procedure: ARTHROSCOPY KNEE, DEBRIDEMENT;  Surgeon: Cammy Copa, MD;  Location[facility];  Service: Orthopedics;  Laterality: Right;   SHOULDER ARTHROSCOPY WITH DISTAL CLAVICLE RESECTION Left 03/24/2021   Procedure: LEFT SHOULDER ARTHROSCOPY WITH DEBRIDEMENT AND DISTAL CLAVICLE RESECTION;  Surgeon: Kathryne Hitch, MD;  Location: [facility];  Service: Orthopedics;  Laterality: Left;    OB History   No obstetric history on file.      Home Medications    Prior to Admission medications   Medication Sig Start Date End Date Taking? Authorizing Provider  doxycycline (VIBRAMYCIN) 100 MG capsule Take 1 capsule (100 mg total) by mouth 2 (two) times daily for 7 days. 05/06/22 05/13/22 Yes Carlisle Beers, FNP  atorvastatin (LIPITOR) 40 MG tablet Take 1.5 tablets (60 mg total) by mouth daily. 02/21/22   Marcine Matar, MD  famotidine (PEPCID) 20 MG tablet Take 1 tablet (20 mg total) by mouth 2 (two) times daily. 02/21/22   Marcine Matar, MD  fexofenadine (ALLEGRA) 180 MG tablet Take 180 mg by mouth daily.  [provider]  lisinopril-hydrochlorothiazide (ZESTORETIC) 20-25 MG tablet TAKE 1 TABLET BY MOUTH DAILY. 02/21/22   Marcine Matar, MD  Multiple Vitamins-Minerals (CENTRUM ADULTS) TABS Take by mouth.    [provider]  traZODone (DESYREL) 50 MG tablet TAKE 0.5-1 TABLETS (25-50 MG TOTAL) BY MOUTH AT BEDTIME AS NEEDED FOR SLEEP. 03/06/22   Marcine Matar, MD    Family History Family History  Problem Relation Age of Onset   Hypertension Mother    Diabetes Mother    Hypertension Brother    Breast cancer Sister    Breast  cancer Maternal Aunt     Social History Social History   Tobacco Use   Smoking status: Every Day    Packs/day: 0.50    Years: 20.00    Total pack years: 10.00    Types: Cigarettes   Smokeless tobacco: Never  Substance Use Topics   Alcohol use: Yes    Comment: socially- ocassional   Drug use: No     Allergies   Levaquin [levofloxacin in d5w]   Review of Systems Review of Systems Per HPI  Physical Exam Triage Vital Signs ED Triage Vitals [05/06/22 1015]  Enc Vitals Group     BP (!) 138/93     Pulse Rate 93     Resp 16     Temp (!) 97.5 F (36.4 C)     Temp Source Oral     SpO2 98 %     Weight      Height      Head Circumference      Peak Flow      Pain Score      Pain Loc      Pain Edu?      Excl. in GC?    No data found.  Updated Vital Signs BP (!) 138/93 (BP Location: Left Arm)   Pulse 93   Temp (!) 97.5 F (36.4 C) (Oral)   Resp 16   LMP 07/13/2020 (Approximate) Comment: states no periods in approx 33mos; negative UPT  SpO2 98%   Visual Acuity Right Eye Distance:   Left Eye Distance:   Bilateral Distance:    Right Eye Near:   Left Eye Near:    Bilateral Near:     Physical Exam Vitals and nursing note reviewed.  Constitutional:      Appearance: She is not ill-appearing or toxic-appearing.  HENT:     Head: Normocephalic and atraumatic.     Right Ear: Hearing and external ear normal.     Left Ear: Hearing and external ear normal.     Nose: Nose normal.     Mouth/Throat:     Lips: Pink.  Eyes:     General: Lids are normal. Vision grossly intact. Gaze aligned appropriately.     Extraocular Movements: Extraocular movements intact.     Conjunctiva/sclera: Conjunctivae normal.  Pulmonary:     Effort: Pulmonary effort is normal.  Musculoskeletal:     Cervical back: Neck supple.  Skin:    General: Skin is warm and dry.     Capillary Refill: Capillary refill takes less than 2 seconds.     Findings: Abscess present. No rash.     Comments:  Pilonidal cyst with abscess to the right gluteal cleft.  Abscess does not appear erythematous and is firm to palpation.  Abscess is approximately 2 to 3 cm in diameter without surrounding soft tissue erythema or cellulitis.  Neurological:     General: No focal  deficit present.     Mental Status: She is alert and oriented to person, place, and time. Mental status is at baseline.     Cranial Nerves: No dysarthria or facial asymmetry.  Psychiatric:        Mood and Affect: Mood normal.        Speech: Speech normal.        Behavior: Behavior normal.        Thought Content: Thought content normal.        Judgment: Judgment normal.      UC Treatments / Results  Labs (all labs ordered are listed, but only abnormal results are displayed) Labs Reviewed - No data to display  EKG   Radiology No results found.  Procedures Procedures (including critical care time)  Medications Ordered in UC Medications - No data to display  Initial Impression / Assessment and Plan / UC Course  I have reviewed the triage vital signs and the nursing notes.  Pertinent labs & imaging results that were available during my care of the patient were reviewed by me and considered in my medical decision making (see chart for details).   1.  Pilonidal cyst with abscess Doxycycline twice daily for the next 7 days to treat infected pilonidal abscess.  Abscess is not ready for drainage as it is very firm to touch and mildly swollen.  Strict urgent care and ER return precautions discussed.  She is to apply warm compresses and perform Epsom salt soaks a few times daily to help with the infected material become softer and begin to drain.  She may need to have this lanced in the next few days should the area become extremely painful, swollen, and raised.  She is agreeable with plan.   Discussed physical exam and available lab work findings in clinic with patient.  Counseled patient regarding appropriate use of medications  and potential side effects for all medications recommended or prescribed today. Discussed red flag signs and symptoms of worsening condition,when to call the PCP office, return to urgent care, and when to seek higher level of care in the emergency department. Patient verbalizes understanding and agreement with plan. All questions answered. Patient discharged in stable condition.    Final Clinical Impressions(s) / UC Diagnoses   Final diagnoses:  Pilonidal cyst with abscess     Discharge Instructions      Take doxycycline antibiotic with breakfast and dinner (2 times a day) for the next 7 days to treat infection to the pilonidal abscess.  Your abscess is not ready to be drained today.  Please apply warm compresses and perform Epsom salt soaks to help with infection and make the abscess softer so that it is able to drain.  Please return to urgent care if your abscess becomes significantly more painful, red, or soft.   If you develop any new or worsening symptoms or do not improve in the next 2 to 3 days, please return.  If your symptoms are severe, please go to the emergency room.  Follow-up with your primary care provider for further evaluation and management of your symptoms as well as ongoing wellness visits.  I hope you feel better!   ED Prescriptions     Medication Sig Dispense Auth. Provider   doxycycline (VIBRAMYCIN) 100 MG capsule Take 1 capsule (100 mg total) by mouth 2 (two) times daily for 7 days. 14 capsule Carlisle BeersStanhope, Deltha Bernales M, FNP      PDMP not reviewed this encounter.   Reita MayStanhope, Natalye Kott  M, FNP 05/06/22 1055

## 2022-05-12 ENCOUNTER — Ambulatory Visit (INDEPENDENT_AMBULATORY_CARE_PROVIDER_SITE_OTHER): Payer: No Typology Code available for payment source

## 2022-05-12 ENCOUNTER — Ambulatory Visit (INDEPENDENT_AMBULATORY_CARE_PROVIDER_SITE_OTHER): Payer: No Typology Code available for payment source | Admitting: Physician Assistant

## 2022-05-12 ENCOUNTER — Encounter: Payer: Self-pay | Admitting: Physician Assistant

## 2022-05-12 DIAGNOSIS — M25512 Pain in left shoulder: Secondary | ICD-10-CM

## 2022-05-12 NOTE — Progress Notes (Signed)
Office Visit Note   Patient: Kristi Cisneros           Date of Birth: 02/02/1966           MRN: [id number] Visit Date: 05/12/2022              Requested by: Marcine Matar, MD [address] [place],  [place] [address] PCP: Marcine Matar, MD  Chief Complaint  Patient presents with  . Left Shoulder - Pain      HPI: Kristi Cisneros is a pleasant 56 year old woman who is left-hand-dominant and a patient of Dr. Eliberto Ivory.  She is 13 months status post left shoulder arthroscopy decompression and distal clavicle excision with debridement.  She did have bursitis but rotator cuff and biceps tendon had some tendinosis but were all intact with no tearing.  She is 4 days status post lifting her granddaughter and turning her and felt for a large pops in her left shoulder anteriorly.  Since then she has had difficulty raising her arm overhead.  Also has pain in the anterior part of her shoulder.  Assessment & Plan: Visit Diagnoses:  1. Acute pain of left shoulder     Plan: Exam is consistent with proximal biceps tendon tear.  What is unclear to me as if she has a rotator cuff tear associated with this.  She was a difficult exam today because of pain.  We discussed the options.  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  Will get an MRI and she already has a follow-up scheduled with Dr. Magnus Ivan or Delane Ginger If she just has a proximal biceps tendon I reassured her that this could be treated conservatively.  I did asked that she pull out her exercises including wall climbs and pendulum exercises so that her shoulder does not get stiff  Follow-Up Instructions: Return in about 2 weeks (around 05/26/2022).   Ortho Exam  Patient is alert, oriented, no adenopathy, well-dressed, normal affect, normal respiratory effort. Examination of her left shoulder she has no swelling.  She has no gross deformity she is acutely tender over  the insertion of the biceps.  She is very resistant overhead motion of her arm even passively.  She does have bruising down into the biceps muscle.  Distal biceps tendon is nontender and is intact.  She has migration of the biceps muscle consistent with Popeye deformity she is neurovascularly intact  Imaging: XR Shoulder Left  Result Date: 05/12/2022 Three-view radiographs of her left shoulder demonstrate no evidence of dislocation or fracture.  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  Interval distal clavicle excision since previous films  No images are attached to the encounter.  Labs: Lab Results  Component Value Date   HGBA1C 6.2 (H) 02/21/2022   HGBA1C 5.9 (H) 03/18/2020   HGBA1C 5.8 (H) 06/23/2019   REPTSTATUS 01/27/2017 FINAL 01/23/2017   GRAMSTAIN  01/23/2017    RARE WBC PRESENT, PREDOMINANTLY PMN RARE GRAM POSITIVE COCCI RARE GRAM NEGATIVE RODS    CULT  01/23/2017    RARE STAPHYLOCOCCUS AUREUS FEW STENOTROPHOMONAS MALTOPHILIA    LABORGA STAPHYLOCOCCUS AUREUS 01/23/2017   LABORGA STENOTROPHOMONAS MALTOPHILIA 01/23/2017     Lab Results  Component Value Date   ALBUMIN 4.5 02/21/2022   ALBUMIN 4.2 06/23/2019   ALBUMIN 4.4 07/22/2018    No results found for: "MG" Lab Results  Component Value Date  VD25OH 18 (L) 11/20/2014    No results found for: "PREALBUMIN"    Latest Ref Rng & Units 02/21/2022    4:35 PM 06/23/2019    4:40 PM 07/22/2018    4:39 PM  CBC EXTENDED  WBC 3.4 - 10.8 x10E3/uL 7.9  7.9  7.6   RBC 3.77 - 5.28 x10E6/uL 4.28  4.01  4.08   Hemoglobin 11.1 - 15.9 g/dL 24.2  68.3  41.9   HCT 34.0 - 46.6 % 40.8  36.6  36.1   Platelets 150 - 450 x10E3/uL 271  279  300      There is no height or weight on file to calculate BMI.  Orders:  Orders Placed This Encounter  Procedures  . XR Shoulder Left  . MR Shoulder Left w/o contrast   No orders of the defined types were placed in this encounter.    Procedures: No procedures  performed  Clinical Data: No additional findings.  ROS:  All other systems negative, except as noted in the HPI. Review of Systems  All other systems reviewed and are negative.  Objective: Vital Signs: LMP 07/13/2020 (Approximate) Comment: states no periods in approx 90mos; negative UPT  Specialty Comments:  No specialty comments available.  PMFS History: Patient Active Problem List   Diagnosis Date Noted  . Impingement syndrome of left shoulder 03/24/2021  . Family history of breast cancer 03/18/2020  . Other insomnia 03/18/2020  . Class 2 severe obesity due to excess calories with serious comorbidity in adult (HCC) 03/18/2020  . Prediabetes 07/20/2019  . GERD (gastroesophageal reflux disease) 04/05/2015  . Vitamin D deficiency 11/23/2014  . Bilateral hand numbness 11/20/2014  . Subcutaneous nodule of chest wall 11/20/2014  . Left shoulder pain 11/20/2014  . Menorrhagia 09/03/2014  . Patellofemoral arthritis of right knee 08/19/2014  . Right knee pain 05/22/2014  . Right ankle pain 04/17/2014  . Plantar fasciitis, bilateral 03/27/2014  . Achilles tendonitis 03/27/2014  . HLD (hyperlipidemia) 03/06/2014  . Current smoker 03/04/2014  . Hypertension    Past Medical History:  Diagnosis Date  . Arthritis   . GERD (gastroesophageal reflux disease)   . Hyperlipidemia   . Hypertension Dx 2015  . Pre-diabetes   . Smoker    1/2 ppd    Family History  Problem Relation Age of Onset  . Hypertension Mother   . Diabetes Mother   . Hypertension Brother   . Breast cancer Sister   . Breast cancer Maternal Aunt     Past Surgical History:  Procedure Laterality Date  . KNEE ARTHROSCOPY Right 01/26/2015   Procedure: ARTHROSCOPY KNEE, DEBRIDEMENT;  Surgeon: Cammy Copa, MD;  Location: [facility] OR;  Service: Orthopedics;  Laterality: Right;  . SHOULDER ARTHROSCOPY WITH DISTAL CLAVICLE RESECTION Left 03/24/2021   Procedure: LEFT SHOULDER ARTHROSCOPY WITH DEBRIDEMENT AND DISTAL  CLAVICLE RESECTION;  Surgeon: Kathryne Hitch, MD;  Location: [facility];  Service: Orthopedics;  Laterality: Left;   Social History   Occupational History  . 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 Smoking status: Every Day    Packs/day: 0.50    Years: 20.00    Total pack years: 10.00    Types: Cigarettes  . Smokeless tobacco: Never  Substance and Sexual Activity  . Alcohol use: Yes    Comment: socially- ocassional  . Drug use: No  . Sexual activity: Yes    Birth  control/protection: None

## 2022-05-20 ENCOUNTER — Other Ambulatory Visit: Payer: No Typology Code available for payment source

## 2022-05-29 ENCOUNTER — Ambulatory Visit: Payer: No Typology Code available for payment source | Admitting: Orthopaedic Surgery

## 2022-05-29 ENCOUNTER — Ambulatory Visit: Payer: Medicaid Other | Admitting: Orthopaedic Surgery

## 2022-05-30 ENCOUNTER — Ambulatory Visit
Admission: RE | Admit: 2022-05-30 | Discharge: 2022-05-30 | Disposition: A | Discharge status: Home / Self Care | Payer: Medicaid Other | Source: Ambulatory Visit | Attending: Physician Assistant | Admitting: Physician Assistant

## 2022-05-30 DIAGNOSIS — M25512 Pain in left shoulder: Secondary | ICD-10-CM

## 2022-06-01 ENCOUNTER — Other Ambulatory Visit: Payer: Self-pay

## 2022-06-07 ENCOUNTER — Ambulatory Visit (INDEPENDENT_AMBULATORY_CARE_PROVIDER_SITE_OTHER): Payer: No Typology Code available for payment source | Admitting: Orthopaedic Surgery

## 2022-06-07 ENCOUNTER — Encounter: Payer: Self-pay | Admitting: Orthopaedic Surgery

## 2022-06-07 DIAGNOSIS — M25512 Pain in left shoulder: Secondary | ICD-10-CM

## 2022-06-07 MED ORDER — METHYLPREDNISOLONE ACETATE 40 MG/ML IJ SUSP
40.0000 mg | INTRAMUSCULAR | Status: AC | PRN
  Administered 2022-06-07: 40 mg via INTRA_ARTICULAR

## 2022-06-07 MED ORDER — LIDOCAINE HCL 1 % IJ SOLN
3.0000 mL | INTRAMUSCULAR | Status: AC | PRN
  Administered 2022-06-07: 3 mL

## 2022-06-07 NOTE — Progress Notes (Signed)
The patient is a 57 year old who comes in today to go over an MRI of her left shoulder.  We actually performed arthroscopic surgery on that left shoulder and November 2022.  We found significant bursitis and tendinosis of the rotator cuff but no tear.  She did have significant AC joint arthritis and we performed a partial acromioplasty but not a distal clavicle resection.  She had really done well until last month when she was lifting a 20 pound child and felt a pop in her shoulder.  She had significant pain after that so MRI was recommended to rule out a biceps tendon tear based on the bruising that she was having around her shoulder as well.  She does work in housekeeping and is try to limit the activities that she performs with that shoulder.  She still reporting pain and discomfort.  On exam today there is no bruising but she certainly has pain in the left shoulder.  The rotator cuff itself does not show true weakness but she does have pain past 90 degrees of abduction of the shoulder as well as external rotation.  There is definitely pain around the biceps tendon itself.  I did review the MRI and it does show edema in the [facility] joint and around the footprint of the rotator cuff.  The biceps tendon has some fluid in it as well.  I did not see any significant tearing in terms of full-thickness tearing and there is no muscle atrophy but there certainly enough edema to show that there is an injury to her shoulder.  I did recommend at least a subacromial steroid injection to see if this will help with her discomfort and pain.  She agreed to this and tolerated it well.  I would like to see her back in just 2 weeks.  If she does not have significant improvement in her symptoms I would recommend likely an arthroscopic intervention for the left shoulder.  She agrees with this treatment plan and did tolerate the steroid injection well.      Procedure Note  Patient: Kristi Cisneros             Date of Birth:  [birth year]           MRN: [id number]             Visit Date: 06/07/2022  Procedures: Visit Diagnoses:  1. Acute pain of left shoulder     Large Joint Inj: L subacromial bursa on 06/07/2022 4:37 PM Indications: pain and diagnostic evaluation Details: 22 G 1.5 in needle  Arthrogram: No  Medications: 3 mL lidocaine 1 %; 40 mg methylPREDNISolone acetate 40 MG/ML Outcome: tolerated well, no immediate complications Procedure, treatment alternatives, risks and benefits explained, specific risks discussed. Consent was given by the patient. Immediately prior to procedure a time out was called to verify the correct patient, procedure, equipment, support staff and site/side marked as required. Patient was prepped and draped in the usual sterile fashion.

## 2022-06-26 ENCOUNTER — Encounter: Payer: Self-pay | Admitting: Physician Assistant

## 2022-06-26 ENCOUNTER — Ambulatory Visit (INDEPENDENT_AMBULATORY_CARE_PROVIDER_SITE_OTHER): Payer: No Typology Code available for payment source | Admitting: Physician Assistant

## 2022-06-26 DIAGNOSIS — R202 Paresthesia of skin: Secondary | ICD-10-CM | POA: Diagnosis not present

## 2022-06-26 DIAGNOSIS — G8929 Other chronic pain: Secondary | ICD-10-CM

## 2022-06-26 DIAGNOSIS — M25512 Pain in left shoulder: Secondary | ICD-10-CM | POA: Diagnosis not present

## 2022-06-26 NOTE — Addendum Note (Signed)
Addended by: Robyne Peers on: 06/26/2022 12:47 PM   Modules accepted: Orders

## 2022-06-26 NOTE — Progress Notes (Signed)
HPI: [name] comes in today for follow-up of her left shoulder status post injection 06/07/2022.  She states the injection helped for about a week.  She someone who we performed arthroscopy of her left shoulder with subacromial decompression.  She is found to have no significant arthritic changes or rotator cuff tears.  She did have significant bursitis and tendinosis of the rotator cuff which were debrided.  She also underwent a partial acromioplasty and partial distal clavicle resection.  States she is having burning about the shoulder at times.  She also notes that she has numbness tingling bilateral hands and she states this is throughout both hands.  She is left-hand dominant and uses her hands a lot.  She states she wears braces at night for carpal tunnel syndrome.  She states despite wearing these braces at night that she is still awakened by the numbness tingling in her hands.  She denies any neck pain.   Review of systems: See HPI otherwise negative.  Physical exam: General well-developed well-nourished female no acute distress.  Left shoulder no weakness with external and internal rotation against resistance.  Tenderness about the biceps region into the muscle belly also the bicipital groove on the left.  No deformity of the biceps no bruising about the shoulder.  Positive impingement on the left.  Bilateral hands she has subjective decrease sensation throughout all of the fingers of both hands.  More so on the left.  Has a Tinel's bilaterally over the median nerve at the wrist.  Tinel's bilaterally over the ulnar nerve at the elbow.  Positive Phalen's bilaterally.  Radial pulses are present bilaterally.   Impression: Chronic left shoulder pain Paresthesia bilateral hands  Plan: Will have her undergo EMG nerve conduction studies bilateral hands to rule out carpal tunnel syndrome/ulnar nerve entrapment bilaterally.  Have her follow-up after the EMG nerve conduction studies to go over  results and discuss further treatment.  She will continue to work on range of motion strengthening left shoulder

## 2022-07-05 ENCOUNTER — Telehealth: Payer: Self-pay | Admitting: Physical Medicine and Rehabilitation

## 2022-07-05 NOTE — Telephone Encounter (Signed)
Spoke with patient and rescheduled for 07/14/22

## 2022-07-05 NOTE — Telephone Encounter (Signed)
Patient called. She would like to [facility] her appointment.

## 2022-07-12 ENCOUNTER — Encounter: Payer: No Typology Code available for payment source | Admitting: Physical Medicine and Rehabilitation

## 2022-07-14 ENCOUNTER — Ambulatory Visit: Payer: No Typology Code available for payment source | Admitting: Physical Medicine and Rehabilitation

## 2022-07-14 DIAGNOSIS — G8929 Other chronic pain: Secondary | ICD-10-CM | POA: Diagnosis not present

## 2022-07-14 DIAGNOSIS — R202 Paresthesia of skin: Secondary | ICD-10-CM

## 2022-07-14 DIAGNOSIS — M25512 Pain in left shoulder: Secondary | ICD-10-CM | POA: Diagnosis not present

## 2022-07-14 DIAGNOSIS — M79641 Pain in right hand: Secondary | ICD-10-CM | POA: Diagnosis not present

## 2022-07-14 DIAGNOSIS — M79642 Pain in left hand: Secondary | ICD-10-CM | POA: Diagnosis not present

## 2022-07-14 DIAGNOSIS — R531 Weakness: Secondary | ICD-10-CM | POA: Diagnosis not present

## 2022-07-14 NOTE — Progress Notes (Unsigned)
Functional Pain Scale - descriptive words and definitions  Unmanageable (7)  Pain interferes with normal ADL's/nothing seems to help/sleep is very difficult/active distractions are very difficult to concentrate on. Severe range order  Average Pain 7-8   Left handed. Numbness, tingling, pain and weakness in both hands and can radiate up above the wrists

## 2022-07-20 ENCOUNTER — Encounter: Payer: Self-pay | Admitting: Physical Medicine and Rehabilitation

## 2022-07-20 ENCOUNTER — Encounter: Payer: Self-pay | Admitting: Radiology

## 2022-07-20 NOTE — Progress Notes (Signed)
Kristi Cisneros - 57 y.o. female MRN [id number]  Date of birth: 1966/03/18  Office Visit Note: Visit Date: 07/14/2022 PCP: Ladell Pier, MD Referred by: Pete Pelt, PA-C  Subjective: Chief Complaint  Patient presents with   Right Hand - Numbness, Pain, Weakness   Left Hand - Numbness, Pain, Weakness   HPI:  Kristi Cisneros is a 57 y.o. female who comes in today at the request of Benita Stabile, PA-C for evaluation and management of chronic, worsening and severe pain, numbness and tingling in the Bilateral upper extremities.  Patient is Left hand dominant.  Ongoing chronic symptoms of shoulder pain particularly left shoulder pain status post acromioplasty and decompression.  She has had injections shoulders as well.  No frank radicular symptoms down the arms but pain numbness and tingling chronically for many years in the hands.  Started left hand for the right hand and now both are fairly equal.  She gets weakness and feels like she is dropping objects at times.  She rates her pain and function as a 7 and unmanageable at this point.  She has tried medications and bracing etc. without any relief at all.  She does get pain with movement of the arms which could be more musculoskeletal in nature.  She has not had any prior electrodiagnostic studies.  She is not diabetic although she is considered prediabetic.  No prior cervical surgery.   Review of Systems  Musculoskeletal:  Positive for joint pain.  Neurological:  Positive for tingling and weakness.  All other systems reviewed and are negative.  Otherwise per HPI.  Assessment & Plan: Visit Diagnoses:    ICD-10-CM   1. Paresthesia of skin  R20.2 NCV with EMG (electromyography)    2. Bilateral hand pain  M79.641    M79.642     3. Chronic left shoulder pain  M25.512    G89.29     4. Weakness  R53.1       Plan: Impression: A lot of her hand complaints do seem to be related to carpal tunnel syndrome.  She could have some level  of pain from osteoarthritis of the [facility] joints and tendinitis.  It does not appear to be frankly radicular.  Patient prediabetic but not on insulin.  Electrodiagnostic study performed.  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   There is no significant electrodiagnostic evidence of any other focal nerve entrapment, brachial plexopathy or cervical radiculopathy.   Recommendations: 1.  Follow-up with referring physician. 2.  Continue current management of symptoms. 3.  Continue use of resting splint at night-time and as needed during the day. 4.  Suggest surgical evaluation.  While designated as severe she should make good recovery after decompression.  Meds & Orders: No orders of the defined types were placed in this encounter.   Orders Placed This Encounter  Procedures   NCV with EMG (electromyography)    Follow-up: Return for Benita Stabile, P.A.-C.   Procedures: No procedures performed  EMG & NCV Findings: Evaluation of the left median motor nerve showed prolonged distal onset latency (6.0 ms), reduced amplitude (4.5 mV), and decreased conduction velocity (Elbow-Wrist, 47 m/s).  The right median motor nerve showed prolonged distal onset latency (6.6 ms) and decreased conduction velocity (Elbow-Wrist, 43 m/s).  The left median (across palm) sensory and the right median (across palm) sensory nerves showed prolonged  distal peak latency (Wrist, L8.7, R8.0 ms), reduced amplitude (L3.2, R4.4 V), and prolonged distal peak latency (Palm, L4.6, R3.7 ms).  The right ulnar sensory nerve showed reduced amplitude (9.5 V).  All remaining nerves (as indicated in the following tables) were within normal limits.  Left vs. Right side comparison data for the ulnar motor nerve indicates abnormal L-R amplitude difference (31.0 %) and abnormal L-R velocity difference (A  Elbow-B Elbow, 21 m/s).  All remaining left vs. right side differences were within normal limits.    All examined muscles (as indicated in the following table) showed no evidence of electrical instability.    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   There is no significant electrodiagnostic evidence of any other focal nerve entrapment, brachial plexopathy or cervical radiculopathy.   Recommendations: 1.  Follow-up with referring physician. 2.  Continue current management of symptoms. 3.  Continue use of resting splint at night-time and as needed during the day. 4.  Suggest surgical evaluation.  While designated as severe she should make good recovery after decompression.  ___________________________ Laurence Spates FAAPMR Board Certified, American Board of Physical Medicine and Rehabilitation    Nerve Conduction Studies Anti Sensory Summary Table   Stim Site NR Peak (ms) Norm Peak (ms) P-T Amp (V) Norm P-T Amp Site1 Site2 Delta-P (ms) Dist (cm) Vel (m/s) Norm Vel (m/s)  Left Median Acr Palm Anti Sensory (2nd Digit)  30.8C  Wrist    *8.7 <3.6 *3.2 >10 Wrist Palm 4.1 0.0    Palm    *4.6 <2.0 13.9         Right Median Acr Palm Anti Sensory (2nd Digit)  29.6C  Wrist    *8.0 <3.6 *4.4 >10 Wrist Palm 4.3 0.0    Palm    *3.7 <2.0 15.2         Left Radial Anti Sensory (Base 1st Digit)  30.7C  Wrist    2.1 <3.1 26.8  Wrist Base 1st Digit 2.1 0.0    Right Radial Anti Sensory (Base 1st Digit)  29.6C  Wrist    2.0 <3.1 20.7  Wrist Base 1st Digit 2.0 0.0    Left Ulnar Anti Sensory (5th Digit)  31.3C  Wrist    3.4 <3.7 22.1 >15.0 Wrist 5th Digit 3.4 14.0 41 >38  Right Ulnar Anti Sensory (5th Digit)  29.9C  Wrist    3.2 <3.7 *9.5 >15.0 Wrist 5th Digit 3.2 14.0 44 >38   Motor Summary Table   Stim Site NR Onset (ms) Norm Onset  (ms) O-P Amp (mV) Norm O-P Amp Site1 Site2 Delta-0 (ms) Dist (cm) Vel (m/s) Norm Vel (m/s)  Left Median Motor (Abd Poll Brev)  31.1C  Wrist    *6.0 <4.2 *4.5 >5 Elbow Wrist 4.5 21.0 *47 >50  Elbow    10.5  4.4         Right Median Motor (Abd Poll Brev)  29.9C  Wrist    *6.6 <4.2 5.0 >5 Elbow Wrist 4.4 19.0 *43 >50  Elbow    11.0  5.0         Left Ulnar Motor (Abd Dig Min)  31.4C  Wrist    3.0 <4.2 5.8 >3 B Elbow Wrist 3.2 21.0 66 >53  B Elbow    6.2  7.2  A Elbow B Elbow 1.4 10.0 71 >53  A Elbow  7.6  7.5         Right Ulnar Motor (Abd Dig Min)  30C  Wrist    2.9 <4.2 4.0 >3 B Elbow Wrist 3.2 19.0 59 >53  B Elbow    6.1  8.1  A Elbow B Elbow 1.2 11.0 92 >53  A Elbow    7.3  7.9          EMG   Side Muscle Nerve Root Ins Act Fibs Psw Amp Dur Poly Recrt Int Fraser Din Comment  Left Abd Poll Brev Median C8-T1 Nml Nml Nml Nml Nml 0 Nml Nml   Left 1stDorInt Ulnar C8-T1 Nml Nml Nml Nml Nml 0 Nml Nml   Left PronatorTeres Median C6-7 Nml Nml Nml Nml Nml 0 Nml Nml   Left Biceps Musculocut C5-6 Nml Nml Nml Nml Nml 0 Nml Nml   Left Deltoid Axillary C5-6 Nml Nml Nml Nml Nml 0 Nml Nml     Nerve Conduction Studies Anti Sensory Left/Right Comparison   Stim Site L Lat (ms) R Lat (ms) L-R Lat (ms) L Amp (V) R Amp (V) L-R Amp (%) Site1 Site2 L Vel (m/s) R Vel (m/s) L-R Vel (m/s)  Median Acr Palm Anti Sensory (2nd Digit)  30.8C  Wrist *8.7 *8.0 0.7 *3.2 *4.4 27.3 Wrist Palm     Palm *4.6 *3.7 0.9 13.9 15.2 8.6       Radial Anti Sensory (Base 1st Digit)  30.7C  Wrist 2.1 2.0 0.1 26.8 20.7 22.8 Wrist Base 1st Digit     Ulnar Anti Sensory (5th Digit)  31.3C  Wrist 3.4 3.2 0.2 22.1 *9.5 57.0 Wrist 5th Digit 41 44 3   Motor Left/Right Comparison   Stim Site L Lat (ms) R Lat (ms) L-R Lat (ms) L Amp (mV) R Amp (mV) L-R Amp (%) Site1 Site2 L Vel (m/s) R Vel (m/s) L-R Vel (m/s)  Median Motor (Abd Poll Brev)  31.1C  Wrist *6.0 *6.6 0.6 *4.5 5.0 10.0 Elbow Wrist *47 *43 4  Elbow 10.5 11.0 0.5 4.4  5.0 12.0       Ulnar Motor (Abd Dig Min)  31.4C  Wrist 3.0 2.9 0.1 5.8 4.0 *31.0 B Elbow Wrist 66 59 7  B Elbow 6.2 6.1 0.1 7.2 8.1 11.1 A Elbow B Elbow 71 92 *21  A Elbow 7.6 7.3 0.3 7.5 7.9 5.1          Waveforms:                      Clinical History: No specialty comments available.     Objective:  VS:  HT:    WT:   BMI:     BP:   HR: bpm  TEMP: ( )  RESP:  Physical Exam Vitals and nursing note reviewed.  Constitutional:      General: She is not in acute distress.    Appearance: Normal appearance. She is well-developed. She is not ill-appearing.  HENT:     Head: Normocephalic and atraumatic.  Eyes:     Conjunctiva/sclera: Conjunctivae normal.     Pupils: Pupils are equal, round, and reactive to light.  Cardiovascular:     Rate and Rhythm: Normal rate.     Pulses: Normal pulses.  Pulmonary:     Effort: Pulmonary effort is normal.  Musculoskeletal:        General: No swelling, tenderness or deformity.     Right lower leg: No edema.     Left lower  leg: No edema.     Comments: Inspection reveals no atrophy of the bilateral APB or FDI or hand intrinsics. There is no swelling, color changes, allodynia or dystrophic changes. There is 5 out of 5 strength in the bilateral wrist extension, finger abduction and long finger flexion.  There is impaired sensation to light touch in the bilateral median nerve distributions.. There is a negative Froment's test bilaterally. There is a negative Tinel's test at the bilateral wrist and elbow. There is a Positive Phalen's test bilaterally. There is a negative Hoffmann's test bilaterally.  Skin:    General: Skin is warm and dry.     Findings: No erythema or rash.  Neurological:     General: No focal deficit present.     Mental Status: She is alert and oriented to person, place, and time.     Sensory: No sensory deficit.     Motor: No weakness or abnormal muscle tone.     Coordination: Coordination normal.     Gait: Gait  normal.  Psychiatric:        Mood and Affect: Mood normal.        Behavior: Behavior normal.      Imaging: No results found.

## 2022-07-20 NOTE — Procedures (Signed)
EMG & NCV Findings: Evaluation of the left median motor nerve showed prolonged distal onset latency (6.0 ms), reduced amplitude (4.5 mV), and decreased conduction velocity (Elbow-Wrist, 47 m/s).  The right median motor nerve showed prolonged distal onset latency (6.6 ms) and decreased conduction velocity (Elbow-Wrist, 43 m/s).  The left median (across palm) sensory and the right median (across palm) sensory nerves showed prolonged distal peak latency (Wrist, L8.7, R8.0 ms), reduced amplitude (L3.2, R4.4 V), and prolonged distal peak latency (Palm, L4.6, R3.7 ms).  The right ulnar sensory nerve showed reduced amplitude (9.5 V).  All remaining nerves (as indicated in the following tables) were within normal limits.  Left vs. Right side comparison data for the ulnar motor nerve indicates abnormal L-R amplitude difference (31.0 %) and abnormal L-R velocity difference (A Elbow-B Elbow, 21 m/s).  All remaining left vs. right side differences were within normal limits.    All examined muscles (as indicated in the following table) showed no evidence of electrical instability.    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   There is no significant electrodiagnostic evidence of any other focal nerve entrapment, brachial plexopathy or cervical radiculopathy.   Recommendations: 1.  Follow-up with referring physician. 2.  Continue current management of symptoms. 3.  Continue use of resting splint at night-time and as needed during the day. 4.  Suggest surgical evaluation.  While designated as severe she should make good recovery after decompression.  ___________________________ Laurence Spates FAAPMR Board Certified, American Board of Physical Medicine and Rehabilitation    Nerve Conduction Studies Anti Sensory Summary Table   Stim Site NR Peak (ms)  Norm Peak (ms) P-T Amp (V) Norm P-T Amp Site1 Site2 Delta-P (ms) Dist (cm) Vel (m/s) Norm Vel (m/s)  Left Median Acr Palm Anti Sensory (2nd Digit)  30.8C  Wrist    *8.7 <3.6 *3.2 >10 Wrist Palm 4.1 0.0    Palm    *4.6 <2.0 13.9         Right Median Acr Palm Anti Sensory (2nd Digit)  29.6C  Wrist    *8.0 <3.6 *4.4 >10 Wrist Palm 4.3 0.0    Palm    *3.7 <2.0 15.2         Left Radial Anti Sensory (Base 1st Digit)  30.7C  Wrist    2.1 <3.1 26.8  Wrist Base 1st Digit 2.1 0.0    Right Radial Anti Sensory (Base 1st Digit)  29.6C  Wrist    2.0 <3.1 20.7  Wrist Base 1st Digit 2.0 0.0    Left Ulnar Anti Sensory (5th Digit)  31.3C  Wrist    3.4 <3.7 22.1 >15.0 Wrist 5th Digit 3.4 14.0 41 >38  Right Ulnar Anti Sensory (5th Digit)  29.9C  Wrist    3.2 <3.7 *9.5 >15.0 Wrist 5th Digit 3.2 14.0 44 >38   Motor Summary Table   Stim Site NR Onset (ms) Norm Onset (ms) O-P Amp (mV) Norm O-P Amp Site1 Site2 Delta-0 (ms) Dist (cm) Vel (m/s) Norm Vel (m/s)  Left Median Motor (Abd Poll Brev)  31.1C  Wrist    *6.0 <4.2 *4.5 >5 Elbow Wrist 4.5 21.0 *47 >50  Elbow    10.5  4.4         Right Median Motor (Abd Poll Brev)  29.9C  Wrist    *6.6 <4.2 5.0 >5 Elbow Wrist 4.4 19.0 *  43 >50  Elbow    11.0  5.0         Left Ulnar Motor (Abd Dig Min)  31.4C  Wrist    3.0 <4.2 5.8 >3 B Elbow Wrist 3.2 21.0 66 >53  B Elbow    6.2  7.2  A Elbow B Elbow 1.4 10.0 71 >53  A Elbow    7.6  7.5         Right Ulnar Motor (Abd Dig Min)  30C  Wrist    2.9 <4.2 4.0 >3 B Elbow Wrist 3.2 19.0 59 >53  B Elbow    6.1  8.1  A Elbow B Elbow 1.2 11.0 92 >53  A Elbow    7.3  7.9          EMG   Side Muscle Nerve Root Ins Act Fibs Psw Amp Dur Poly Recrt Int Fraser Din Comment  Left Abd Poll Brev Median C8-T1 Nml Nml Nml Nml Nml 0 Nml Nml   Left 1stDorInt Ulnar C8-T1 Nml Nml Nml Nml Nml 0 Nml Nml   Left PronatorTeres Median C6-7 Nml Nml Nml Nml Nml 0 Nml Nml   Left Biceps Musculocut C5-6 Nml Nml Nml Nml Nml 0 Nml Nml   Left Deltoid  Axillary C5-6 Nml Nml Nml Nml Nml 0 Nml Nml     Nerve Conduction Studies Anti Sensory Left/Right Comparison   Stim Site L Lat (ms) R Lat (ms) L-R Lat (ms) L Amp (V) R Amp (V) L-R Amp (%) Site1 Site2 L Vel (m/s) R Vel (m/s) L-R Vel (m/s)  Median Acr Palm Anti Sensory (2nd Digit)  30.8C  Wrist *8.7 *8.0 0.7 *3.2 *4.4 27.3 Wrist Palm     Palm *4.6 *3.7 0.9 13.9 15.2 8.6       Radial Anti Sensory (Base 1st Digit)  30.7C  Wrist 2.1 2.0 0.1 26.8 20.7 22.8 Wrist Base 1st Digit     Ulnar Anti Sensory (5th Digit)  31.3C  Wrist 3.4 3.2 0.2 22.1 *9.5 57.0 Wrist 5th Digit 41 44 3   Motor Left/Right Comparison   Stim Site L Lat (ms) R Lat (ms) L-R Lat (ms) L Amp (mV) R Amp (mV) L-R Amp (%) Site1 Site2 L Vel (m/s) R Vel (m/s) L-R Vel (m/s)  Median Motor (Abd Poll Brev)  31.1C  Wrist *6.0 *6.6 0.6 *4.5 5.0 10.0 Elbow Wrist *47 *43 4  Elbow 10.5 11.0 0.5 4.4 5.0 12.0       Ulnar Motor (Abd Dig Min)  31.4C  Wrist 3.0 2.9 0.1 5.8 4.0 *31.0 B Elbow Wrist 66 59 7  B Elbow 6.2 6.1 0.1 7.2 8.1 11.1 A Elbow B Elbow 71 92 *21  A Elbow 7.6 7.3 0.3 7.5 7.9 5.1          Waveforms:

## 2022-07-24 ENCOUNTER — Encounter: Payer: Self-pay | Admitting: Physician Assistant

## 2022-07-24 ENCOUNTER — Ambulatory Visit (INDEPENDENT_AMBULATORY_CARE_PROVIDER_SITE_OTHER): Payer: No Typology Code available for payment source | Admitting: Physician Assistant

## 2022-07-24 DIAGNOSIS — G5601 Carpal tunnel syndrome, right upper limb: Secondary | ICD-10-CM | POA: Diagnosis not present

## 2022-07-24 DIAGNOSIS — G5602 Carpal tunnel syndrome, left upper limb: Secondary | ICD-10-CM

## 2022-07-24 NOTE — Progress Notes (Signed)
HPI: Mrs. Hershner comes in today for follow-up after EMG nerve conduction studies of her extremities.  She is going to have numbness tingling bilateral hands.  She is left-hand dominant.  She states that her left hand bothers her slightly more than the right.  EMG nerve conduction studies were reviewed and shows no abnormal study with evidence of severe bilateral median nerve entrapment at the wrist.  No electrodiagnostic evidence of nerve entrapment, brachial plexopathies or cervical radiculopathy.  Impression: Bilateral carpal tunnel syndrome  Plan: After discussing the findings with her.  Recommended carpal tunnel release.  She is agreeable.  Therefore we will start with the left hand as this is the most symptomatic.  Postoperative protocol discussed.  Questions were encouraged and answered at length.  Will work on scheduling for this in the near future.

## 2022-08-02 ENCOUNTER — Other Ambulatory Visit: Payer: Self-pay

## 2022-08-27 IMAGING — MG DIGITAL SCREENING BILAT W/ CAD
5 series · 5 of 5 positions shown · non-contrast
Comparison: None.

CLINICAL DATA: Screening.

EXAM:
DIGITAL SCREENING BILATERAL MAMMOGRAM WITH CAD
TECHNIQUE: Bilateral screening digital craniocaudal and mediolateral oblique
mammograms were obtained. The images were evaluated with
computer-aided detection.

[L CC]
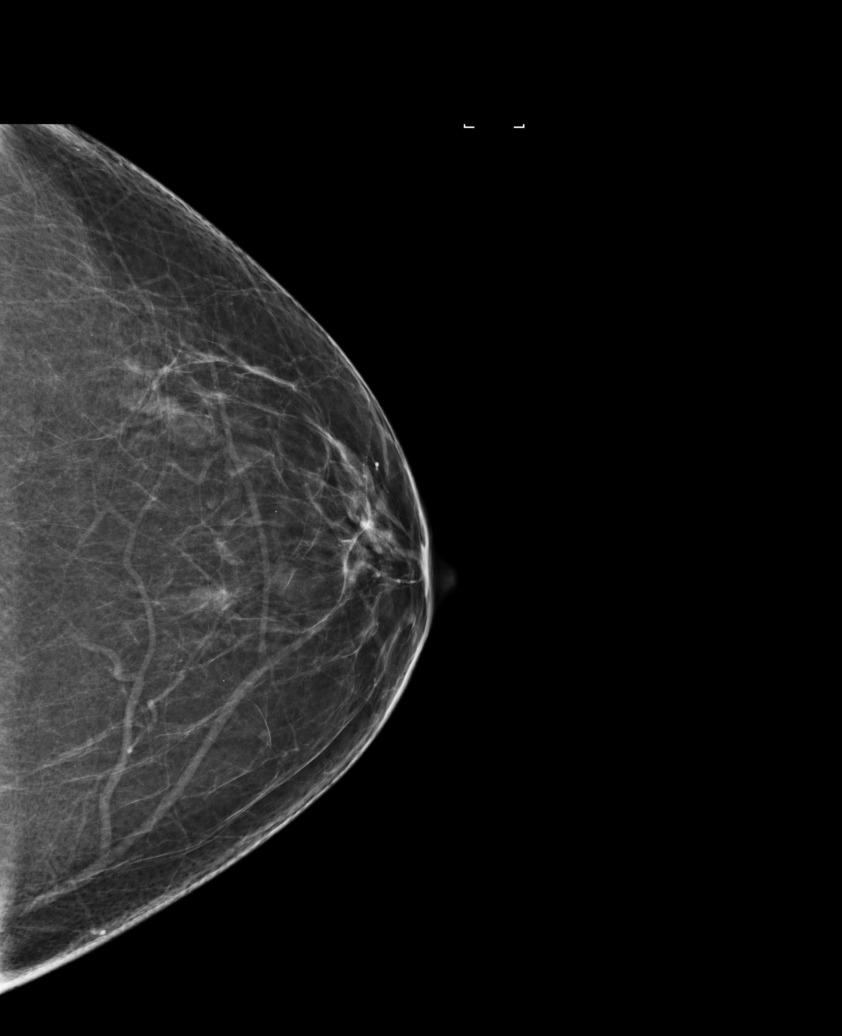

[L MLO]
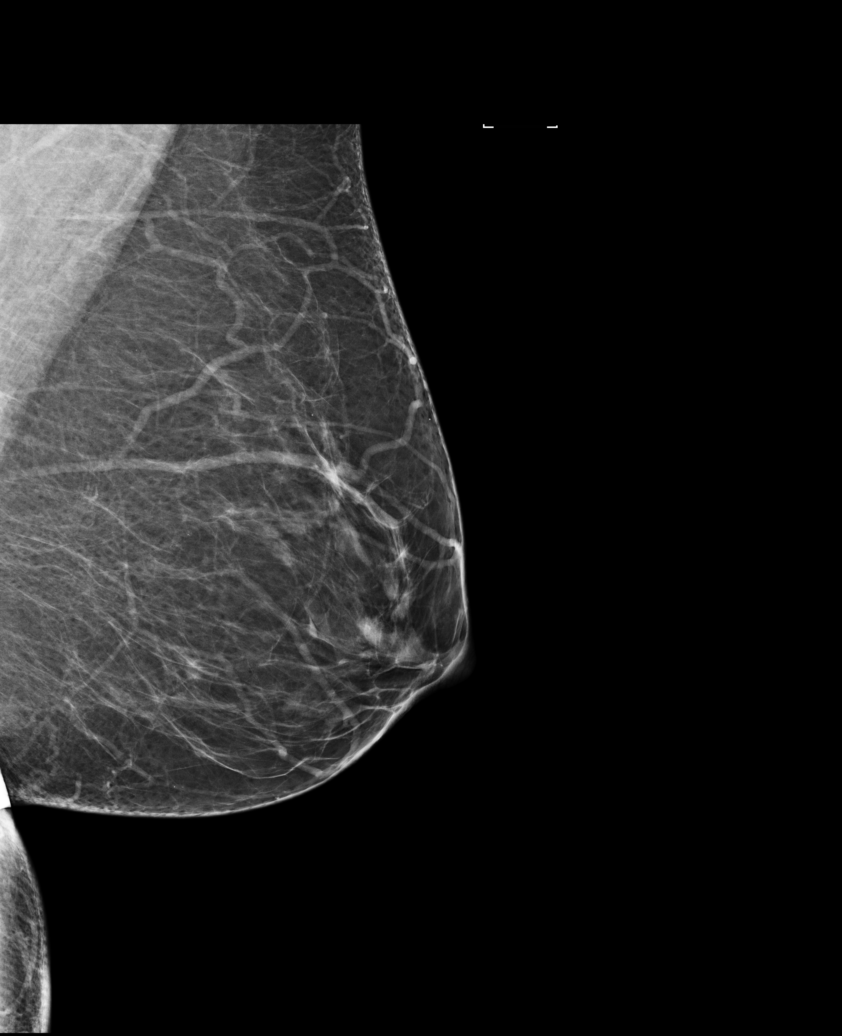

[R CC (1 of 2)]
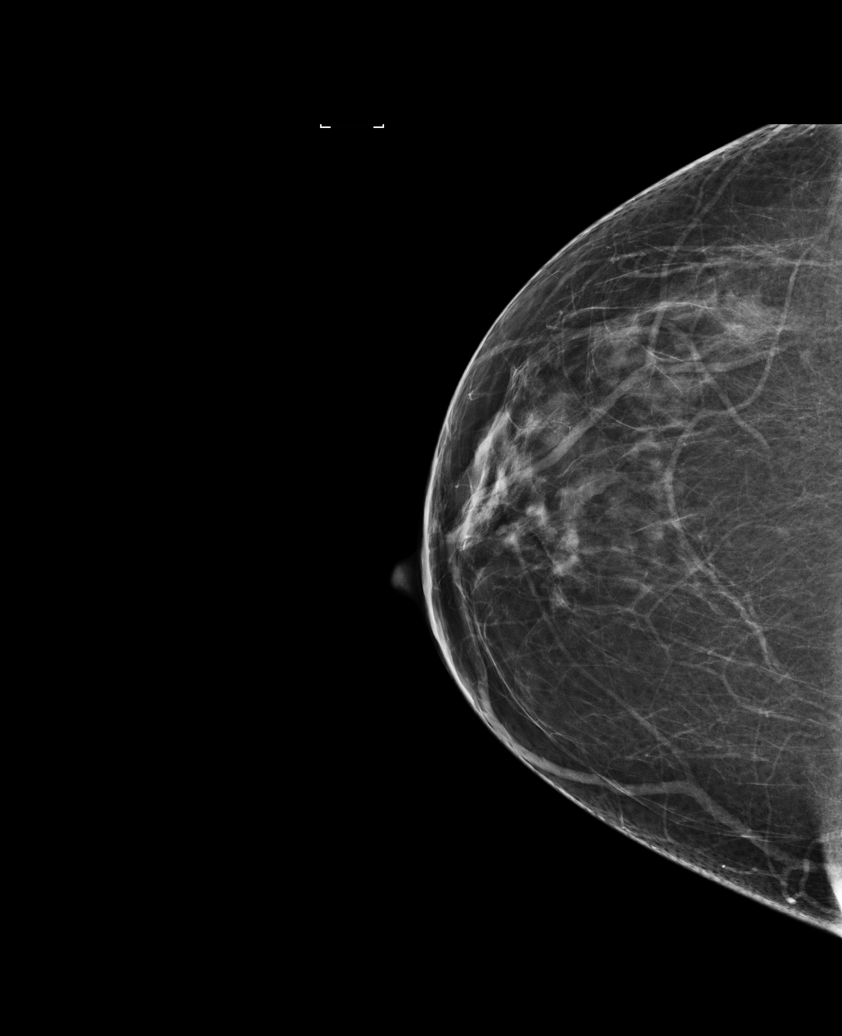

[R MLO]
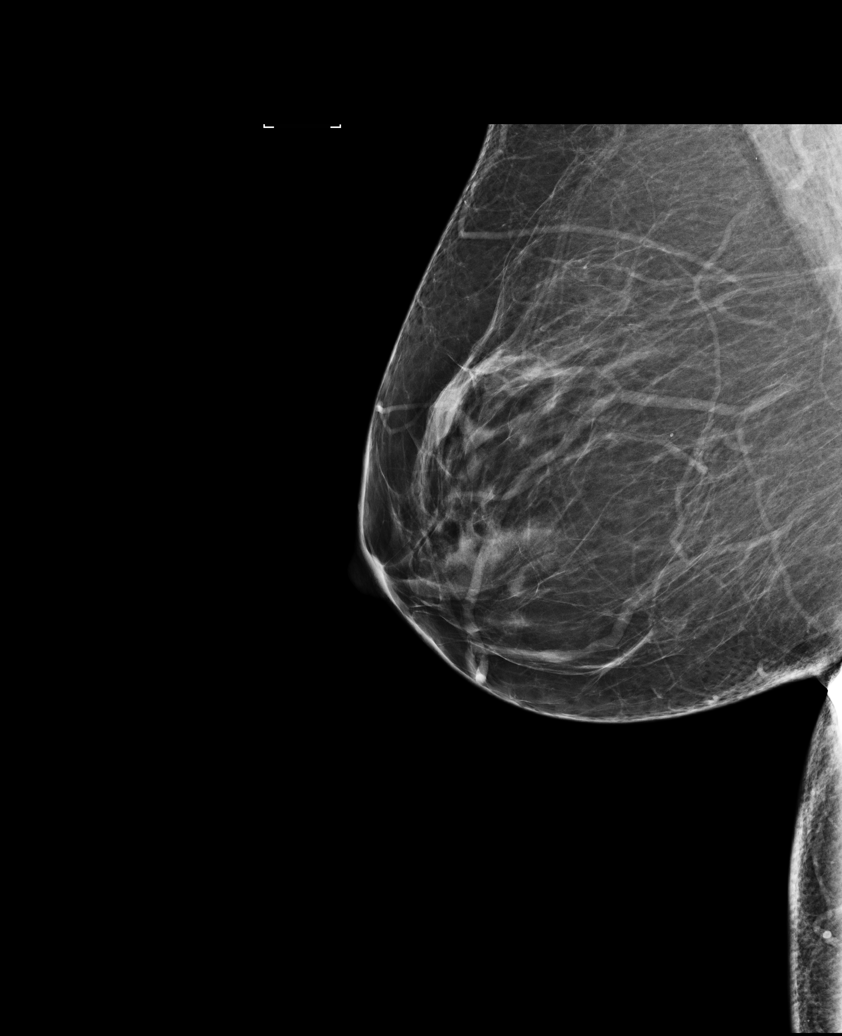

[R CC (2 of 2)]
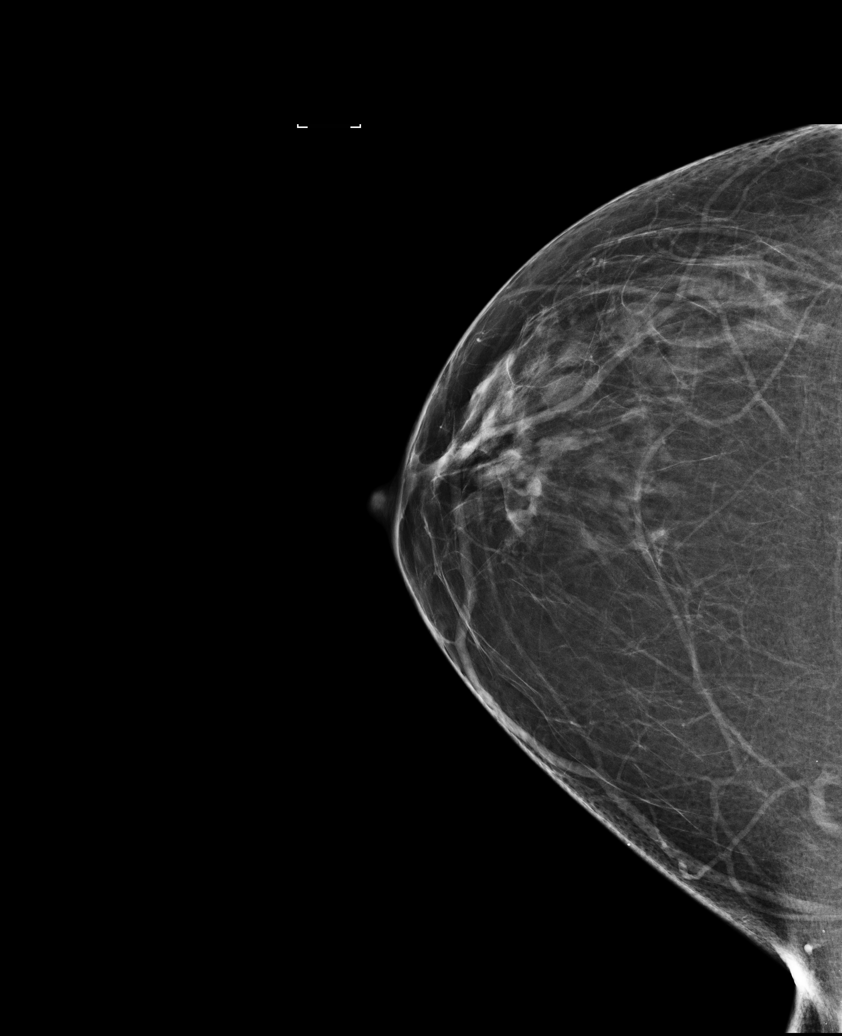

[5 of 5 positions shown; findings below may reference images not displayed]

ACR Breast Density Category b: There are scattered areas of
fibroglandular density.
FINDINGS: There are no findings suspicious for malignancy.
IMPRESSION: No mammographic evidence of malignancy. A result letter of this
screening mammogram will be mailed directly to the patient.

RECOMMENDATION:
Screening mammogram in one year. (Code:[id number])

BI-RADS CATEGORY  1: Negative.

## 2022-08-29 DIAGNOSIS — M25571 Pain in right ankle and joints of right foot: Secondary | ICD-10-CM | POA: Diagnosis not present

## 2022-08-29 DIAGNOSIS — X58XXXA Exposure to other specified factors, initial encounter: Secondary | ICD-10-CM | POA: Diagnosis not present

## 2022-08-29 DIAGNOSIS — S99911A Unspecified injury of right ankle, initial encounter: Secondary | ICD-10-CM | POA: Diagnosis not present

## 2022-08-31 ENCOUNTER — Other Ambulatory Visit: Payer: Self-pay | Admitting: Orthopaedic Surgery

## 2022-08-31 DIAGNOSIS — G5602 Carpal tunnel syndrome, left upper limb: Secondary | ICD-10-CM | POA: Diagnosis not present

## 2022-08-31 MED ORDER — HYDROCODONE-ACETAMINOPHEN 5-325 MG PO TABS: 1.0000 | ORAL_TABLET | Freq: Four times a day (QID) | ORAL | 0 refills | Status: DC | PRN

## 2022-09-01 ENCOUNTER — Other Ambulatory Visit: Payer: Self-pay

## 2022-09-14 ENCOUNTER — Encounter: Payer: Self-pay | Admitting: Orthopaedic Surgery

## 2022-09-14 ENCOUNTER — Ambulatory Visit (INDEPENDENT_AMBULATORY_CARE_PROVIDER_SITE_OTHER): Payer: Medicaid Other | Admitting: Orthopaedic Surgery

## 2022-09-14 DIAGNOSIS — G5601 Carpal tunnel syndrome, right upper limb: Secondary | ICD-10-CM

## 2022-09-14 DIAGNOSIS — Z9889 Other specified postprocedural states: Secondary | ICD-10-CM

## 2022-09-14 NOTE — Progress Notes (Signed)
The patient is now 2 weeks status post a left open carpal tunnel release.  She is doing well.  She denies any numbness and tingling in that hand.  She does have known severe carpal tunnel syndrome of the right side.  The left hand incision looks good.  The sutures been removed and I did place some Steri-Strips.  She moves her fingers and thumb easily.  She will protect the incision for the next few days.  She will be careful with the activities that she does with her left hand.  Will see her back in 4 weeks and if things are looking good with her left hand we can start working on scheduling her for right open carpal tunnel release given her severe right carpal tunnel syndrome.

## 2022-09-25 ENCOUNTER — Other Ambulatory Visit: Payer: Self-pay

## 2022-09-25 ENCOUNTER — Other Ambulatory Visit: Payer: Self-pay | Admitting: Internal Medicine

## 2022-09-25 DIAGNOSIS — I10 Essential (primary) hypertension: Secondary | ICD-10-CM

## 2022-09-25 MED ORDER — LISINOPRIL-HYDROCHLOROTHIAZIDE 20-25 MG PO TABS
1.0000 | ORAL_TABLET | Freq: Every day | ORAL | 0 refills | Status: DC
  Filled 2022-09-25 – 2022-10-02 (×2): qty 30, 30d supply, fill #0

## 2022-09-29 ENCOUNTER — Other Ambulatory Visit: Payer: Self-pay

## 2022-10-02 ENCOUNTER — Other Ambulatory Visit: Payer: Self-pay

## 2022-10-12 ENCOUNTER — Encounter: Payer: Self-pay | Admitting: Orthopaedic Surgery

## 2022-10-12 ENCOUNTER — Ambulatory Visit (INDEPENDENT_AMBULATORY_CARE_PROVIDER_SITE_OTHER): Payer: Medicaid Other | Admitting: Orthopaedic Surgery

## 2022-10-12 DIAGNOSIS — G5601 Carpal tunnel syndrome, right upper limb: Secondary | ICD-10-CM

## 2022-10-12 DIAGNOSIS — Z9889 Other specified postprocedural states: Secondary | ICD-10-CM

## 2022-10-12 NOTE — Progress Notes (Signed)
The patient is now 6 weeks out from a left open carpal tunnel release.  She says she is doing very well with that side and is just a little tight.  She has known severe right carpal tunnel syndrome.  At this point she is ready to proceed with surgery on the right side as well.  Her left hand she has good pinch and grip strength.  Her sensation is improved significantly.  Her incision looks good.  Her right hand does have weakness with positive Phalen's and Tinel's exam.  Previous EMG studies show severe entrapment of the median nerve on the right side as well.  At this point she is ready to proceed with a right open carpal tunnel release.  Having had this before on the left side she is fully aware of the risk and benefits of this surgery.  We will work on getting this scheduled in the near future and then we will see her back in 2 weeks postoperative.

## 2022-11-02 ENCOUNTER — Other Ambulatory Visit: Payer: Self-pay | Admitting: Internal Medicine

## 2022-11-02 ENCOUNTER — Other Ambulatory Visit: Payer: Self-pay | Admitting: Orthopaedic Surgery

## 2022-11-02 DIAGNOSIS — G5601 Carpal tunnel syndrome, right upper limb: Secondary | ICD-10-CM | POA: Diagnosis not present

## 2022-11-02 DIAGNOSIS — I10 Essential (primary) hypertension: Secondary | ICD-10-CM

## 2022-11-02 MED ORDER — HYDROCODONE-ACETAMINOPHEN 5-325 MG PO TABS: 1.0000 | ORAL_TABLET | Freq: Four times a day (QID) | ORAL | 0 refills | Status: AC | PRN

## 2022-11-02 NOTE — Telephone Encounter (Signed)
Requested medications are due for refill today.  yes  Requested medications are on the active medications list.  yes  Last refill. 09/25/2022 #30 0 rf  Future visit scheduled.   no  Notes to clinic.  Pt already given a courtesy refill.    Requested Prescriptions  Pending Prescriptions Disp Refills   lisinopril-hydrochlorothiazide (ZESTORETIC) 20-25 MG tablet 30 tablet 0    Sig: TAKE 1 TABLET BY MOUTH DAILY.     Cardiovascular:  ACEI + Diuretic Combos Failed - 11/02/2022  2:38 PM      Failed - Na in normal range and within 180 days    Sodium  Date Value Ref Range Status  02/21/2022 140 134 - 144 mmol/L Final         Failed - K in normal range and within 180 days    Potassium  Date Value Ref Range Status  02/21/2022 3.9 3.5 - 5.2 mmol/L Final         Failed - Cr in normal range and within 180 days    Creat  Date Value Ref Range Status  05/23/2016 0.76 0.50 - 1.10 mg/dL Final   Creatinine, Ser  Date Value Ref Range Status  02/21/2022 0.89 0.57 - 1.00 mg/dL Final         Failed - eGFR is 30 or above and within 180 days    GFR, Est African American  Date Value Ref Range Status  05/23/2016 >89 >=60 mL/min Final   GFR calc Af Amer  Date Value Ref Range Status  06/23/2019 87 >59 mL/min/1.73 Final   GFR, Est Non African American  Date Value Ref Range Status  05/23/2016 >89 >=60 mL/min Final   GFR, Estimated  Date Value Ref Range Status  03/22/2021 >60 >60 mL/min Final    Comment:    (NOTE) Calculated using the CKD-EPI Creatinine Equation (2021)    eGFR  Date Value Ref Range Status  02/21/2022 77 >59 mL/min/1.73 Final         Failed - Last BP in normal range    BP Readings from Last 1 Encounters:  05/06/22 (!) 138/93         Failed - Valid encounter within last 6 months    Recent Outpatient Visits           8 months ago Essential hypertension   [place] [facility] Marcine Matar, MD   1 year ago Post covid-19 condition,  unspecified   [facility] [place], Marzella Schlein, [place]   1 year ago COVID-19   [facility], Gildardo Pounds, NP   1 year ago Essential hypertension   [place] [facility] Marcine Matar, MD   2 years ago Essential hypertension    [facility] Marcine Matar, MD              Passed - Patient is not pregnant

## 2022-11-03 ENCOUNTER — Other Ambulatory Visit: Payer: Self-pay

## 2022-11-08 ENCOUNTER — Other Ambulatory Visit: Payer: Self-pay

## 2022-11-08 ENCOUNTER — Other Ambulatory Visit: Payer: Self-pay | Admitting: Internal Medicine

## 2022-11-08 DIAGNOSIS — I10 Essential (primary) hypertension: Secondary | ICD-10-CM

## 2022-11-08 MED ORDER — LISINOPRIL-HYDROCHLOROTHIAZIDE 20-25 MG PO TABS
1.0000 | ORAL_TABLET | Freq: Every day | ORAL | 0 refills | Status: DC
  Filled 2022-11-08: qty 30, 30d supply, fill #0

## 2022-11-09 ENCOUNTER — Other Ambulatory Visit: Payer: Self-pay

## 2022-11-15 ENCOUNTER — Encounter: Payer: Self-pay | Admitting: Physician Assistant

## 2022-11-15 ENCOUNTER — Ambulatory Visit (INDEPENDENT_AMBULATORY_CARE_PROVIDER_SITE_OTHER): Payer: Medicaid Other | Admitting: Physician Assistant

## 2022-11-15 DIAGNOSIS — Z9889 Other specified postprocedural states: Secondary | ICD-10-CM

## 2022-11-15 NOTE — Progress Notes (Signed)
HPI: Mrs. Twedt returns today status post right carpal tunnel release 11/02/2022.  She states overall that her pain in the hand and numbness is much improved.  She does have some pain in the hand whenever she abducts the thumb and rates this to be 5 out of 10 pain at worst.  Right hand no abnormal warmth erythema about the hand particularly over the surgical incision.  There is no drainage.  No dehiscence of the wound does not appear completely healed.  Suture does not approximate the skin well.  She has subjective full sensation at the fingertips in the median nerve distribution.  Impression: Status post right carpal tunnel release 11/02/2022  Plan: Will keep the sutures intact for now.  Will see her back in a week and remove the sutures.  She can get the incision wet in the shower and for gentle hygiene no submerging of the hand in water.  No heavy lifting or grasping.  Questions were encouraged and answered at length

## 2022-11-22 ENCOUNTER — Encounter: Payer: Self-pay | Admitting: Orthopaedic Surgery

## 2022-11-22 ENCOUNTER — Ambulatory Visit (INDEPENDENT_AMBULATORY_CARE_PROVIDER_SITE_OTHER): Payer: Medicaid Other | Admitting: Orthopaedic Surgery

## 2022-11-22 DIAGNOSIS — Z9889 Other specified postprocedural states: Secondary | ICD-10-CM

## 2022-11-22 NOTE — Progress Notes (Signed)
The patient is following up status post a right open carpal tunnel release.  We actually performed surgery with a left open carpal tunnel release also this year.  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  She says the hands are swollen and she is having some pain but overall she is making good progress.  Her right hand incision looks good.  The sutures have been removed and Steri-Strips applied.  Both hands are swollen but overall she looks like she is making good progress.  Her numbness and tingling is lessening some.  Her EMG studies show that she had quite severe carpal tunnel syndrome bilaterally so she knows he can take 6 months to a year to get full recovery.  At this point since she is doing so well and back to her full activities, follow-up can be as needed.  If she does have any issues at all she knows to let us know.

## 2022-11-29 ENCOUNTER — Ambulatory Visit: Payer: Medicaid Other | Admitting: Physician Assistant

## 2022-12-11 ENCOUNTER — Other Ambulatory Visit: Payer: Self-pay | Admitting: Internal Medicine

## 2022-12-11 ENCOUNTER — Other Ambulatory Visit: Payer: Self-pay

## 2022-12-11 DIAGNOSIS — I10 Essential (primary) hypertension: Secondary | ICD-10-CM

## 2022-12-12 ENCOUNTER — Other Ambulatory Visit: Payer: Self-pay

## 2022-12-12 MED ORDER — LISINOPRIL-HYDROCHLOROTHIAZIDE 20-25 MG PO TABS
1.0000 | ORAL_TABLET | Freq: Every day | ORAL | 0 refills | Status: DC
  Filled 2022-12-12: qty 90, 90d supply, fill #0

## 2022-12-12 NOTE — Telephone Encounter (Signed)
Requested Prescriptions  Pending Prescriptions Disp Refills   lisinopril-hydrochlorothiazide (ZESTORETIC) 20-25 MG tablet 90 tablet 0    Sig: TAKE 1 TABLET BY MOUTH DAILY.     Cardiovascular:  ACEI + Diuretic Combos Failed - 12/11/2022  4:39 PM      Failed - Na in normal range and within 180 days    Sodium  Date Value Ref Range Status  02/21/2022 140 134 - 144 mmol/L Final         Failed - K in normal range and within 180 days    Potassium  Date Value Ref Range Status  02/21/2022 3.9 3.5 - 5.2 mmol/L Final         Failed - Cr in normal range and within 180 days    Creat  Date Value Ref Range Status  05/23/2016 0.76 0.50 - 1.10 mg/dL Final   Creatinine, Ser  Date Value Ref Range Status  02/21/2022 0.89 0.57 - 1.00 mg/dL Final         Failed - eGFR is 30 or above and within 180 days    GFR, Est African American  Date Value Ref Range Status  05/23/2016 >89 >=60 mL/min Final   GFR calc Af Amer  Date Value Ref Range Status  06/23/2019 87 >59 mL/min/1.73 Final   GFR, Est Non African American  Date Value Ref Range Status  05/23/2016 >89 >=60 mL/min Final   GFR, Estimated  Date Value Ref Range Status  03/22/2021 >60 >60 mL/min Final    Comment:    (NOTE) Calculated using the CKD-EPI Creatinine Equation (2021)    eGFR  Date Value Ref Range Status  02/21/2022 77 >59 mL/min/1.73 Final         Failed - Last BP in normal range    BP Readings from Last 1 Encounters:  05/06/22 (!) 138/93         Failed - Valid encounter within last 6 months    Recent Outpatient Visits           9 months ago Essential hypertension   [place] [facility] Marcine Matar, MD   1 year ago Post covid-19 condition, unspecified   [place] [facility] [place], Marzella Schlein, [place]   1 year ago COVID-19   [facility], Gildardo Pounds, NP   1 year ago Essential hypertension   [place] [facility] Marcine Matar, MD   2 years ago Essential hypertension   [place] [facility] Marcine Matar, MD       Future Appointments             In 1 month [place], Marzella Schlein, [place] [facility]            Passed - Patient is not pregnant

## 2022-12-13 ENCOUNTER — Other Ambulatory Visit: Payer: Self-pay

## 2022-12-27 ENCOUNTER — Other Ambulatory Visit: Payer: Self-pay | Admitting: Internal Medicine

## 2022-12-27 ENCOUNTER — Other Ambulatory Visit: Payer: Self-pay

## 2022-12-27 DIAGNOSIS — K219 Gastro-esophageal reflux disease without esophagitis: Secondary | ICD-10-CM

## 2022-12-27 MED ORDER — FAMOTIDINE 20 MG PO TABS
20.0000 mg | ORAL_TABLET | Freq: Two times a day (BID) | ORAL | 0 refills | Status: DC
  Filled 2022-12-27: qty 60, 30d supply, fill #0

## 2023-01-08 ENCOUNTER — Other Ambulatory Visit: Payer: Self-pay

## 2023-01-22 ENCOUNTER — Ambulatory Visit (INDEPENDENT_AMBULATORY_CARE_PROVIDER_SITE_OTHER): Payer: Medicaid Other | Admitting: Orthopaedic Surgery

## 2023-01-22 DIAGNOSIS — M7061 Trochanteric bursitis, right hip: Secondary | ICD-10-CM | POA: Diagnosis not present

## 2023-01-22 MED ORDER — LIDOCAINE HCL 1 % IJ SOLN
3.0000 mL | INTRAMUSCULAR | Status: AC | PRN
  Administered 2023-01-22: 3 mL

## 2023-01-22 MED ORDER — METHYLPREDNISOLONE ACETATE 40 MG/ML IJ SUSP
40.0000 mg | INTRAMUSCULAR | Status: AC | PRN
  Administered 2023-01-22: 40 mg via INTRA_ARTICULAR

## 2023-01-22 NOTE — Progress Notes (Signed)
The patient comes in today with right hip pain and requesting a steroid injection over her right hip trochanteric area.  She actually had injections similar to this back in 2022 and that helped greatly.  We did x-ray her pelvis and her right hip in 2022 and it was normal.  She still denies any groin pain.  She is not a diabetic.  On exam her right hip moves smoothly and fluidly but does have pain to palpation of the trochanteric area and the proximal IT band.  The rest of her exam is normal.  Per request I did place a steroid injection over the point of maximum tenderness over the trochanteric area.  We can repeat this in 3 months if needed.  All questions and concerns were answered and addressed.  Follow-up is as needed.    Procedure Note  Patient: Kristi Cisneros             Date of Birth: 10/04/1965           MRN: [id number]             Visit Date: 01/22/2023  Procedures: Visit Diagnoses:  1. Trochanteric bursitis, right hip     Large Joint Inj: R greater trochanter on 01/22/2023 2:27 PM Indications: pain and diagnostic evaluation Details: 22 G 1.5 in needle, lateral approach  Arthrogram: No  Medications: 3 mL lidocaine 1 %; 40 mg methylPREDNISolone acetate 40 MG/ML Outcome: tolerated well, no immediate complications Procedure, treatment alternatives, risks and benefits explained, specific risks discussed. Consent was given by the patient. Immediately prior to procedure a time out was called to verify the correct patient, procedure, equipment, support staff and site/side marked as required. Patient was prepped and draped in the usual sterile fashion.

## 2023-01-31 ENCOUNTER — Encounter: Payer: Self-pay | Admitting: Physician Assistant

## 2023-01-31 ENCOUNTER — Ambulatory Visit: Payer: Medicaid Other | Attending: Physician Assistant | Admitting: Physician Assistant

## 2023-01-31 ENCOUNTER — Other Ambulatory Visit: Payer: Self-pay

## 2023-01-31 VITALS — BP 116/80 | HR 93 | Wt 257.0 lb

## 2023-01-31 DIAGNOSIS — M62838 Other muscle spasm: Secondary | ICD-10-CM

## 2023-01-31 DIAGNOSIS — E782 Mixed hyperlipidemia: Secondary | ICD-10-CM

## 2023-01-31 DIAGNOSIS — G4709 Other insomnia: Secondary | ICD-10-CM | POA: Diagnosis not present

## 2023-01-31 DIAGNOSIS — K219 Gastro-esophageal reflux disease without esophagitis: Secondary | ICD-10-CM

## 2023-01-31 DIAGNOSIS — R7303 Prediabetes: Secondary | ICD-10-CM

## 2023-01-31 DIAGNOSIS — R232 Flushing: Secondary | ICD-10-CM

## 2023-01-31 DIAGNOSIS — I10 Essential (primary) hypertension: Secondary | ICD-10-CM | POA: Diagnosis not present

## 2023-01-31 MED ORDER — ATORVASTATIN CALCIUM 40 MG PO TABS
60.0000 mg | ORAL_TABLET | Freq: Every day | ORAL | 6 refills | Status: AC
Start: 2023-01-31 — End: ?
  Filled 2023-03-09: qty 45, 30d supply, fill #0
  Filled 2023-04-25 – 2023-06-06 (×2): qty 45, 30d supply, fill #1
  Filled 2023-07-24 – 2023-09-06 (×2): qty 45, 30d supply, fill #2

## 2023-01-31 MED ORDER — TRAZODONE HCL 50 MG PO TABS
25.0000 mg | ORAL_TABLET | Freq: Every evening | ORAL | 0 refills | Status: AC | PRN
Start: 2023-01-31 — End: ?
  Filled 2023-01-31: qty 90, 90d supply, fill #0

## 2023-01-31 MED ORDER — LISINOPRIL-HYDROCHLOROTHIAZIDE 20-25 MG PO TABS
1.0000 | ORAL_TABLET | Freq: Every day | ORAL | 1 refills | Status: DC
  Filled 2023-01-31 – 2023-03-09 (×2): qty 90, 90d supply, fill #0
  Filled 2023-06-06: qty 90, 90d supply, fill #1

## 2023-01-31 MED ORDER — METHOCARBAMOL 500 MG PO TABS
1000.0000 mg | ORAL_TABLET | Freq: Three times a day (TID) | ORAL | 0 refills | Status: AC | PRN
Start: 2023-01-31 — End: ?
  Filled 2023-01-31: qty 90, 15d supply, fill #0

## 2023-01-31 MED ORDER — FAMOTIDINE 20 MG PO TABS
20.0000 mg | ORAL_TABLET | Freq: Two times a day (BID) | ORAL | 0 refills | Status: DC
  Filled 2023-01-31: qty 60, 30d supply, fill #0

## 2023-01-31 NOTE — Patient Instructions (Addendum)
Black cohosh

## 2023-01-31 NOTE — Progress Notes (Unsigned)
Patient ID: GENEICE SCHANK, female   DOB: 08-11-1965, 57 y.o.   MRN: [id number]   Badia Derck, is a 57 y.o. female  ION:[id number]  [id number]  DOB - [birth year]  Chief Complaint  Patient presents with   Back Pain   Medication Refill       Subjective:   Ginette Wiegmann is a 57 y.o. female here today for med RF and blood work.  She did recently have trochanteric bursitis that was treated with steroid injection into the joint.  She is having some muscle tightness and spasm in her left lower back where she had been favoring 1 side due to pain.  The hip pain is improving s/p injection.  Poor diet-admits to too many carbs and too much sugar in menopause.  She has severe hot flashes that prevent getting good rest.  Trazadone helps with sleep but hot flashes still awaken her.    No CP/HA/Dizziness  No problems updated.  ALLERGIES: Allergies  Allergen Reactions   Levaquin [Levofloxacin In D5w] Other (See Comments)    "makes me feel Weird"    PAST MEDICAL HISTORY: Past Medical History:  Diagnosis Date   Arthritis    GERD (gastroesophageal reflux disease)    Hyperlipidemia    Hypertension Dx 2015   Pre-diabetes    Smoker    1/2 ppd    MEDICATIONS AT HOME: Prior to Admission medications   Medication Sig Start Date End Date Taking? Authorizing Provider  methocarbamol (ROBAXIN) 500 MG tablet Take 2 tablets (1,000 mg total) by mouth every 8 (eight) hours as needed. 01/31/23  Yes Anders Simmonds, PA-C  Multiple Vitamins-Minerals (CENTRUM ADULTS) TABS Take by mouth.   Yes [provider]  atorvastatin (LIPITOR) 40 MG tablet Take 1.5 tablets (60 mg total) by mouth daily. 01/31/23   Anders Simmonds, PA-C  famotidine (PEPCID) 20 MG tablet Take 1 tablet (20 mg total) by mouth 2 (two) times daily. 01/31/23   Anders Simmonds, PA-C  fexofenadine (ALLEGRA) 180 MG tablet Take 180 mg by mouth daily. Patient not taking: Reported on 01/31/2023    [provider]   HYDROcodone-acetaminophen (NORCO/VICODIN) 5-325 MG tablet Take 1-2 tablets by mouth every 6 (six) hours as needed for moderate pain. Patient not taking: Reported on 01/31/2023 11/02/22   Kathryne Hitch, MD  lisinopril-hydrochlorothiazide (ZESTORETIC) 20-25 MG tablet TAKE 1 TABLET BY MOUTH DAILY.Must have office visit for refills 01/31/23   Anders Simmonds, PA-C  traZODone (DESYREL) 50 MG tablet TAKE 0.5-1 TABLETS (25-50 MG TOTAL) BY MOUTH AT BEDTIME AS NEEDED FOR SLEEP. 01/31/23   Zerek Litsey, Marzella Schlein, PA-C    ROS: Neg HEENT Neg resp Neg cardiac Neg GI Neg GU Neg psych Neg neuro  Objective:   Vitals:   01/31/23 1628  BP: 116/80  Pulse: 93  SpO2: 96%  Weight: 257 lb (116.6 kg)   Exam General appearance : Awake, alert, not in any distress. Speech Clear. Not toxic looking HEENT: Atraumatic and Normocephalic, pupils equally reactive to light and accomodation Neck: Supple, no JVD. No cervical lymphadenopathy.  Chest: Good air entry bilaterally, CTAB.  No rales/rhonchi/wheezing CVS: S1 S2 regular, no murmurs.  Extremities: B/L Lower Ext shows no edema, both legs are warm to touch Neurology: Awake alert, and oriented X 3, CN II-XII intact, Non focal Skin: No Rash  Data Review Lab Results  Component Value Date   HGBA1C 6.2 (H) 02/21/2022   HGBA1C 5.9 (H) 03/18/2020   HGBA1C 5.8 (H) 06/23/2019  Assessment & Plan   1. Other insomnia stable - traZODone (DESYREL) 50 MG tablet; TAKE 0.5-1 TABLETS (25-50 MG TOTAL) BY MOUTH AT BEDTIME AS NEEDED FOR SLEEP.  Dispense: 90 tablet; Refill: 0  2. Essential hypertension Controlled-continue current regimen - Hemoglobin A1c - lisinopril-hydrochlorothiazide (ZESTORETIC) 20-25 MG tablet; TAKE 1 TABLET BY MOUTH DAILY.Must have office visit for refills  Dispense: 90 tablet; Refill: 1 - Comprehensive metabolic panel  3. Gastroesophageal reflux disease without esophagitis Stable-takes prn - famotidine (PEPCID) 20 MG tablet; Take 1  tablet (20 mg total) by mouth 2 (two) times daily.  Dispense: 60 tablet; Refill: 0  4. Mixed hyperlipidemia - atorvastatin (LIPITOR) 40 MG tablet; Take 1.5 tablets (60 mg total) by mouth daily.  Dispense: 45 tablet; Refill: 6 - Comprehensive metabolic panel  5. Prediabetes Will assess.  I have had a lengthy discussion and provided education about insulin resistance and the intake of too much sugar/refined carbohydrates.  I have advised the patient to work at a goal of eliminating sugary drinks, candy, desserts, sweets, refined sugars, processed foods, and white carbohydrates.  The patient expresses understanding.  - Hemoglobin A1c - Comprehensive metabolic panel  6. Hot flashes - Ambulatory referral to Gynecology  7. Muscle spasm - methocarbamol (ROBAXIN) 500 MG tablet; Take 2 tablets (1,000 mg total) by mouth every 8 (eight) hours as needed.  Dispense: 90 tablet; Refill: 0    Return in about 4 months (around 06/02/2023) for PCP for chronic conditions Dr Laural Benes.  The patient was given clear instructions to go to ER or return to medical center if symptoms don't improve, worsen or new problems develop. The patient verbalized understanding. The patient was told to call to get lab results if they haven't heard anything in the next week.      Georgian Co, PA-C [facility] [place], [place] [phone]   01/31/2023, 4:51 PM

## 2023-02-16 ENCOUNTER — Other Ambulatory Visit: Payer: Self-pay | Admitting: Internal Medicine

## 2023-02-16 DIAGNOSIS — Z1212 Encounter for screening for malignant neoplasm of rectum: Secondary | ICD-10-CM

## 2023-02-16 DIAGNOSIS — Z1211 Encounter for screening for malignant neoplasm of colon: Secondary | ICD-10-CM

## 2023-03-09 ENCOUNTER — Other Ambulatory Visit: Payer: Self-pay | Admitting: Physician Assistant

## 2023-03-09 ENCOUNTER — Other Ambulatory Visit: Payer: Self-pay

## 2023-03-09 DIAGNOSIS — K219 Gastro-esophageal reflux disease without esophagitis: Secondary | ICD-10-CM

## 2023-03-09 MED ORDER — FAMOTIDINE 20 MG PO TABS
20.0000 mg | ORAL_TABLET | Freq: Two times a day (BID) | ORAL | 1 refills | Status: DC
  Filled 2023-03-09: qty 180, 90d supply, fill #0
  Filled 2023-06-06: qty 180, 90d supply, fill #1

## 2023-03-09 NOTE — Telephone Encounter (Signed)
Requested Prescriptions  Pending Prescriptions Disp Refills   famotidine (PEPCID) 20 MG tablet 180 tablet 1    Sig: Take 1 tablet (20 mg total) by mouth 2 (two) times daily.     Gastroenterology:  H2 Antagonists Passed - 03/09/2023  9:29 AM      Passed - Valid encounter within last 12 months    Recent Outpatient Visits           1 month ago Prediabetes   [facility] [place], Marzella Schlein, [place]   1 year ago Essential hypertension   [place] [facility] Marcine Matar, MD   1 year ago Post covid-19 condition, unspecified   [place] [facility] [place], Marzella Schlein, [place]   1 year ago COVID-19   [facility], Gildardo Pounds, NP   2 years ago Essential hypertension   [facility] Marcine Matar, MD

## 2023-03-12 ENCOUNTER — Other Ambulatory Visit: Payer: Self-pay

## 2023-03-13 ENCOUNTER — Other Ambulatory Visit: Payer: Self-pay

## 2023-05-07 ENCOUNTER — Other Ambulatory Visit: Payer: Self-pay

## 2023-05-28 ENCOUNTER — Encounter: Payer: Medicaid Other | Admitting: Obstetrics and Gynecology

## 2023-08-02 ENCOUNTER — Other Ambulatory Visit: Payer: Self-pay

## 2023-09-06 ENCOUNTER — Other Ambulatory Visit: Payer: Self-pay | Admitting: Internal Medicine

## 2023-09-06 ENCOUNTER — Other Ambulatory Visit: Payer: Self-pay

## 2023-09-06 DIAGNOSIS — I10 Essential (primary) hypertension: Secondary | ICD-10-CM

## 2023-09-06 DIAGNOSIS — K219 Gastro-esophageal reflux disease without esophagitis: Secondary | ICD-10-CM

## 2023-09-06 MED ORDER — FAMOTIDINE 20 MG PO TABS
20.0000 mg | ORAL_TABLET | Freq: Two times a day (BID) | ORAL | 0 refills | Status: DC
  Filled 2023-09-06: qty 60, 30d supply, fill #0

## 2023-09-06 MED ORDER — LISINOPRIL-HYDROCHLOROTHIAZIDE 20-25 MG PO TABS
1.0000 | ORAL_TABLET | Freq: Every day | ORAL | 0 refills | Status: AC
  Filled 2023-09-06: qty 30, 30d supply, fill #0

## 2023-09-10 ENCOUNTER — Other Ambulatory Visit: Payer: Self-pay

## 2023-09-20 ENCOUNTER — Other Ambulatory Visit: Payer: Self-pay

## 2023-09-24 ENCOUNTER — Other Ambulatory Visit (HOSPITAL_BASED_OUTPATIENT_CLINIC_OR_DEPARTMENT_OTHER): Payer: Self-pay

## 2023-09-24 ENCOUNTER — Encounter (HOSPITAL_COMMUNITY): Payer: Self-pay

## 2023-09-24 DIAGNOSIS — Z1159 Encounter for screening for other viral diseases: Secondary | ICD-10-CM | POA: Diagnosis not present

## 2023-09-24 DIAGNOSIS — M25471 Effusion, right ankle: Secondary | ICD-10-CM | POA: Diagnosis not present

## 2023-09-24 DIAGNOSIS — Z0000 Encounter for general adult medical examination without abnormal findings: Secondary | ICD-10-CM | POA: Diagnosis not present

## 2023-09-24 DIAGNOSIS — E782 Mixed hyperlipidemia: Secondary | ICD-10-CM | POA: Diagnosis not present

## 2023-09-24 DIAGNOSIS — Z1329 Encounter for screening for other suspected endocrine disorder: Secondary | ICD-10-CM | POA: Diagnosis not present

## 2023-09-24 DIAGNOSIS — L0591 Pilonidal cyst without abscess: Secondary | ICD-10-CM | POA: Diagnosis not present

## 2023-09-24 DIAGNOSIS — Z130 Encounter for screening for diseases of the blood and blood-forming organs and certain disorders involving the immune mechanism: Secondary | ICD-10-CM | POA: Diagnosis not present

## 2023-09-24 DIAGNOSIS — Z1211 Encounter for screening for malignant neoplasm of colon: Secondary | ICD-10-CM | POA: Diagnosis not present

## 2023-09-24 DIAGNOSIS — I10 Essential (primary) hypertension: Secondary | ICD-10-CM | POA: Diagnosis not present

## 2023-09-24 DIAGNOSIS — Z13220 Encounter for screening for lipoid disorders: Secondary | ICD-10-CM | POA: Diagnosis not present

## 2023-09-24 DIAGNOSIS — Z131 Encounter for screening for diabetes mellitus: Secondary | ICD-10-CM | POA: Diagnosis not present

## 2023-09-24 DIAGNOSIS — R7303 Prediabetes: Secondary | ICD-10-CM | POA: Diagnosis not present

## 2023-09-24 MED ORDER — HYDROCHLOROTHIAZIDE 12.5 MG PO TABS
12.5000 mg | ORAL_TABLET | Freq: Every day | ORAL | 1 refills | Status: DC
  Filled 2023-09-24: qty 90, 90d supply, fill #0
  Filled 2023-12-17: qty 90, 90d supply, fill #1

## 2023-09-24 MED ORDER — CEPHALEXIN 500 MG PO CAPS
500.0000 mg | ORAL_CAPSULE | Freq: Four times a day (QID) | ORAL | 0 refills | Status: AC
  Filled 2023-09-24: qty 40, 10d supply, fill #0

## 2023-09-24 MED ORDER — OLMESARTAN MEDOXOMIL 20 MG PO TABS
20.0000 mg | ORAL_TABLET | Freq: Every day | ORAL | 1 refills | Status: DC
  Filled 2023-09-24: qty 90, 90d supply, fill #0
  Filled 2023-12-17: qty 90, 90d supply, fill #1

## 2023-10-01 ENCOUNTER — Ambulatory Visit: Admitting: Internal Medicine

## 2023-10-01 DIAGNOSIS — L0591 Pilonidal cyst without abscess: Secondary | ICD-10-CM | POA: Diagnosis not present

## 2023-10-22 ENCOUNTER — Other Ambulatory Visit (HOSPITAL_BASED_OUTPATIENT_CLINIC_OR_DEPARTMENT_OTHER): Payer: Self-pay

## 2023-10-22 DIAGNOSIS — L0591 Pilonidal cyst without abscess: Secondary | ICD-10-CM | POA: Diagnosis not present

## 2023-10-22 DIAGNOSIS — F17200 Nicotine dependence, unspecified, uncomplicated: Secondary | ICD-10-CM | POA: Diagnosis not present

## 2023-10-22 DIAGNOSIS — E669 Obesity, unspecified: Secondary | ICD-10-CM | POA: Diagnosis not present

## 2023-10-22 MED ORDER — HYDROCODONE-ACETAMINOPHEN 5-325 MG PO TABS
1.0000 | ORAL_TABLET | Freq: Four times a day (QID) | ORAL | 0 refills | Status: AC | PRN
  Filled 2023-10-22: qty 16, 4d supply, fill #0

## 2023-10-23 ENCOUNTER — Other Ambulatory Visit (HOSPITAL_BASED_OUTPATIENT_CLINIC_OR_DEPARTMENT_OTHER): Payer: Self-pay

## 2023-10-23 MED ORDER — ATORVASTATIN CALCIUM 40 MG PO TABS
40.0000 mg | ORAL_TABLET | Freq: Every morning | ORAL | 3 refills | Status: DC
  Filled 2023-10-23 – 2023-11-19 (×2): qty 30, 30d supply, fill #0
  Filled 2023-12-17: qty 30, 30d supply, fill #1
  Filled 2024-01-15: qty 30, 30d supply, fill #2
  Filled 2024-02-18: qty 30, 30d supply, fill #3

## 2023-11-02 ENCOUNTER — Other Ambulatory Visit (HOSPITAL_BASED_OUTPATIENT_CLINIC_OR_DEPARTMENT_OTHER): Payer: Self-pay

## 2023-11-02 ENCOUNTER — Ambulatory Visit: Admitting: Internal Medicine

## 2023-11-13 ENCOUNTER — Other Ambulatory Visit (HOSPITAL_BASED_OUTPATIENT_CLINIC_OR_DEPARTMENT_OTHER): Payer: Self-pay

## 2023-11-13 MED ORDER — FAMOTIDINE 20 MG PO TABS
40.0000 mg | ORAL_TABLET | Freq: Every morning | ORAL | 0 refills | Status: DC
  Filled 2023-11-13: qty 60, 30d supply, fill #0

## 2023-11-19 ENCOUNTER — Other Ambulatory Visit (HOSPITAL_BASED_OUTPATIENT_CLINIC_OR_DEPARTMENT_OTHER): Payer: Self-pay

## 2023-11-19 ENCOUNTER — Other Ambulatory Visit (HOSPITAL_COMMUNITY): Payer: Self-pay

## 2023-11-19 DIAGNOSIS — M25871 Other specified joint disorders, right ankle and foot: Secondary | ICD-10-CM | POA: Diagnosis not present

## 2023-11-19 DIAGNOSIS — R52 Pain, unspecified: Secondary | ICD-10-CM | POA: Diagnosis not present

## 2023-11-19 DIAGNOSIS — M7661 Achilles tendinitis, right leg: Secondary | ICD-10-CM | POA: Diagnosis not present

## 2023-11-19 DIAGNOSIS — M7660 Achilles tendinitis, unspecified leg: Secondary | ICD-10-CM | POA: Diagnosis not present

## 2023-11-19 MED ORDER — DICLOFENAC SODIUM 75 MG PO TBEC
75.0000 mg | DELAYED_RELEASE_TABLET | Freq: Two times a day (BID) | ORAL | 0 refills | Status: AC | PRN
  Filled 2023-11-19 (×2): qty 60, 30d supply, fill #0

## 2023-12-17 ENCOUNTER — Other Ambulatory Visit (HOSPITAL_BASED_OUTPATIENT_CLINIC_OR_DEPARTMENT_OTHER): Payer: Self-pay

## 2023-12-17 DIAGNOSIS — M7660 Achilles tendinitis, unspecified leg: Secondary | ICD-10-CM | POA: Diagnosis not present

## 2023-12-17 DIAGNOSIS — M25871 Other specified joint disorders, right ankle and foot: Secondary | ICD-10-CM | POA: Diagnosis not present

## 2023-12-17 MED ORDER — FAMOTIDINE 20 MG PO TABS
40.0000 mg | ORAL_TABLET | Freq: Every morning | ORAL | 0 refills | Status: DC
  Filled 2023-12-17: qty 60, 30d supply, fill #0

## 2023-12-24 ENCOUNTER — Other Ambulatory Visit (HOSPITAL_BASED_OUTPATIENT_CLINIC_OR_DEPARTMENT_OTHER): Payer: Self-pay

## 2023-12-24 DIAGNOSIS — R92323 Mammographic fibroglandular density, bilateral breasts: Secondary | ICD-10-CM | POA: Diagnosis not present

## 2023-12-24 DIAGNOSIS — E782 Mixed hyperlipidemia: Secondary | ICD-10-CM | POA: Diagnosis not present

## 2023-12-24 DIAGNOSIS — I10 Essential (primary) hypertension: Secondary | ICD-10-CM | POA: Diagnosis not present

## 2023-12-24 DIAGNOSIS — E66812 Obesity, class 2: Secondary | ICD-10-CM | POA: Diagnosis not present

## 2023-12-24 DIAGNOSIS — Z1231 Encounter for screening mammogram for malignant neoplasm of breast: Secondary | ICD-10-CM | POA: Diagnosis not present

## 2023-12-24 DIAGNOSIS — Z6839 Body mass index (BMI) 39.0-39.9, adult: Secondary | ICD-10-CM | POA: Diagnosis not present

## 2023-12-24 MED ORDER — WEGOVY 1.7 MG/0.75ML ~~LOC~~ SOAJ: 1.7000 mg | SUBCUTANEOUS | 5 refills | Status: AC

## 2023-12-24 MED ORDER — WEGOVY 0.25 MG/0.5ML ~~LOC~~ SOAJ
0.2500 mg | SUBCUTANEOUS | 0 refills | Status: DC
  Filled 2023-12-24: qty 2, 28d supply, fill #0

## 2023-12-24 MED ORDER — WEGOVY 0.5 MG/0.5ML ~~LOC~~ SOAJ: 0.5000 mg | SUBCUTANEOUS | 0 refills | Status: AC

## 2023-12-24 MED ORDER — WEGOVY 1 MG/0.5ML ~~LOC~~ SOAJ: 1.0000 mg | SUBCUTANEOUS | 0 refills | Status: AC

## 2023-12-25 ENCOUNTER — Other Ambulatory Visit (HOSPITAL_BASED_OUTPATIENT_CLINIC_OR_DEPARTMENT_OTHER): Payer: Self-pay

## 2024-01-02 ENCOUNTER — Other Ambulatory Visit (HOSPITAL_BASED_OUTPATIENT_CLINIC_OR_DEPARTMENT_OTHER): Payer: Self-pay

## 2024-01-15 ENCOUNTER — Other Ambulatory Visit (HOSPITAL_BASED_OUTPATIENT_CLINIC_OR_DEPARTMENT_OTHER): Payer: Self-pay

## 2024-01-15 ENCOUNTER — Other Ambulatory Visit: Payer: Self-pay

## 2024-01-16 ENCOUNTER — Other Ambulatory Visit (HOSPITAL_BASED_OUTPATIENT_CLINIC_OR_DEPARTMENT_OTHER): Payer: Self-pay

## 2024-01-16 MED ORDER — FAMOTIDINE 20 MG PO TABS
40.0000 mg | ORAL_TABLET | Freq: Every morning | ORAL | 0 refills | Status: DC
  Filled 2024-01-16: qty 60, 30d supply, fill #0

## 2024-01-28 ENCOUNTER — Other Ambulatory Visit (HOSPITAL_BASED_OUTPATIENT_CLINIC_OR_DEPARTMENT_OTHER): Payer: Self-pay

## 2024-01-28 MED ORDER — WEGOVY 0.25 MG/0.5ML ~~LOC~~ SOAJ
0.2500 mg | SUBCUTANEOUS | 0 refills | Status: DC
  Filled 2024-01-28: qty 2, 28d supply, fill #0

## 2024-02-18 ENCOUNTER — Other Ambulatory Visit (HOSPITAL_BASED_OUTPATIENT_CLINIC_OR_DEPARTMENT_OTHER): Payer: Self-pay

## 2024-02-19 ENCOUNTER — Other Ambulatory Visit (HOSPITAL_BASED_OUTPATIENT_CLINIC_OR_DEPARTMENT_OTHER): Payer: Self-pay

## 2024-02-19 ENCOUNTER — Other Ambulatory Visit: Payer: Self-pay

## 2024-02-19 MED ORDER — FAMOTIDINE 20 MG PO TABS
40.0000 mg | ORAL_TABLET | Freq: Every morning | ORAL | 0 refills | Status: DC
  Filled 2024-02-19: qty 60, 30d supply, fill #0

## 2024-03-03 DIAGNOSIS — R52 Pain, unspecified: Secondary | ICD-10-CM | POA: Diagnosis not present

## 2024-03-03 DIAGNOSIS — M25871 Other specified joint disorders, right ankle and foot: Secondary | ICD-10-CM | POA: Diagnosis not present

## 2024-03-03 DIAGNOSIS — M7660 Achilles tendinitis, unspecified leg: Secondary | ICD-10-CM | POA: Diagnosis not present

## 2024-03-19 ENCOUNTER — Other Ambulatory Visit (HOSPITAL_BASED_OUTPATIENT_CLINIC_OR_DEPARTMENT_OTHER): Payer: Self-pay

## 2024-03-19 MED ORDER — OLMESARTAN MEDOXOMIL 20 MG PO TABS
20.0000 mg | ORAL_TABLET | Freq: Every day | ORAL | 1 refills | Status: AC
  Filled 2024-03-19: qty 90, 90d supply, fill #0

## 2024-03-19 MED ORDER — HYDROCHLOROTHIAZIDE 12.5 MG PO TABS
12.5000 mg | ORAL_TABLET | Freq: Every day | ORAL | 1 refills | Status: AC
  Filled 2024-03-19: qty 90, 90d supply, fill #0

## 2024-03-19 MED ORDER — FAMOTIDINE 20 MG PO TABS
40.0000 mg | ORAL_TABLET | Freq: Every morning | ORAL | 0 refills | Status: DC
  Filled 2024-03-19: qty 60, 30d supply, fill #0

## 2024-03-19 MED ORDER — ATORVASTATIN CALCIUM 40 MG PO TABS
40.0000 mg | ORAL_TABLET | Freq: Every morning | ORAL | 3 refills | Status: AC
  Filled 2024-03-19: qty 30, 30d supply, fill #0
  Filled 2024-04-21 – 2024-05-05 (×2): qty 30, 30d supply, fill #1
  Filled 2024-06-03: qty 30, 30d supply, fill #2

## 2024-03-21 ENCOUNTER — Other Ambulatory Visit (HOSPITAL_BASED_OUTPATIENT_CLINIC_OR_DEPARTMENT_OTHER): Payer: Self-pay

## 2024-04-04 ENCOUNTER — Other Ambulatory Visit (HOSPITAL_BASED_OUTPATIENT_CLINIC_OR_DEPARTMENT_OTHER): Payer: Self-pay

## 2024-04-04 MED ORDER — WEGOVY 0.25 MG/0.5ML ~~LOC~~ SOAJ
0.2500 mg | SUBCUTANEOUS | 0 refills | Status: AC
  Filled 2024-04-04: qty 2, 28d supply, fill #0

## 2024-04-14 ENCOUNTER — Other Ambulatory Visit (HOSPITAL_BASED_OUTPATIENT_CLINIC_OR_DEPARTMENT_OTHER): Payer: Self-pay

## 2024-04-14 DIAGNOSIS — I10 Essential (primary) hypertension: Secondary | ICD-10-CM | POA: Diagnosis not present

## 2024-04-14 DIAGNOSIS — J069 Acute upper respiratory infection, unspecified: Secondary | ICD-10-CM | POA: Diagnosis not present

## 2024-04-14 DIAGNOSIS — J019 Acute sinusitis, unspecified: Secondary | ICD-10-CM | POA: Diagnosis not present

## 2024-04-14 DIAGNOSIS — B9689 Other specified bacterial agents as the cause of diseases classified elsewhere: Secondary | ICD-10-CM | POA: Diagnosis not present

## 2024-04-14 DIAGNOSIS — E782 Mixed hyperlipidemia: Secondary | ICD-10-CM | POA: Diagnosis not present

## 2024-04-14 DIAGNOSIS — F17200 Nicotine dependence, unspecified, uncomplicated: Secondary | ICD-10-CM | POA: Diagnosis not present

## 2024-04-14 MED ORDER — DOXYCYCLINE HYCLATE 100 MG PO TABS
100.0000 mg | ORAL_TABLET | Freq: Two times a day (BID) | ORAL | 0 refills | Status: AC
  Filled 2024-04-14: qty 20, 10d supply, fill #0

## 2024-04-14 MED ORDER — PREDNISONE 50 MG PO TABS
ORAL_TABLET | ORAL | 0 refills | Status: AC
  Filled 2024-04-14: qty 5, 5d supply, fill #0

## 2024-04-21 ENCOUNTER — Other Ambulatory Visit (HOSPITAL_BASED_OUTPATIENT_CLINIC_OR_DEPARTMENT_OTHER): Payer: Self-pay

## 2024-04-21 MED ORDER — FAMOTIDINE 20 MG PO TABS
40.0000 mg | ORAL_TABLET | Freq: Every morning | ORAL | 0 refills | Status: DC
  Filled 2024-04-21 – 2024-05-05 (×2): qty 60, 30d supply, fill #0

## 2024-04-24 ENCOUNTER — Other Ambulatory Visit (HOSPITAL_BASED_OUTPATIENT_CLINIC_OR_DEPARTMENT_OTHER): Payer: Self-pay

## 2024-05-01 ENCOUNTER — Other Ambulatory Visit (HOSPITAL_BASED_OUTPATIENT_CLINIC_OR_DEPARTMENT_OTHER): Payer: Self-pay

## 2024-05-05 ENCOUNTER — Other Ambulatory Visit (HOSPITAL_BASED_OUTPATIENT_CLINIC_OR_DEPARTMENT_OTHER): Payer: Self-pay

## 2024-05-06 ENCOUNTER — Other Ambulatory Visit (HOSPITAL_BASED_OUTPATIENT_CLINIC_OR_DEPARTMENT_OTHER): Payer: Self-pay

## 2024-05-06 MED ORDER — OSELTAMIVIR PHOSPHATE 75 MG PO CAPS
75.0000 mg | ORAL_CAPSULE | Freq: Two times a day (BID) | ORAL | 0 refills | Status: AC
  Filled 2024-05-06: qty 10, 5d supply, fill #0

## 2024-05-22 ENCOUNTER — Other Ambulatory Visit (HOSPITAL_BASED_OUTPATIENT_CLINIC_OR_DEPARTMENT_OTHER): Payer: Self-pay

## 2024-06-02 ENCOUNTER — Other Ambulatory Visit (HOSPITAL_BASED_OUTPATIENT_CLINIC_OR_DEPARTMENT_OTHER): Payer: Self-pay

## 2024-06-03 ENCOUNTER — Other Ambulatory Visit (HOSPITAL_BASED_OUTPATIENT_CLINIC_OR_DEPARTMENT_OTHER): Payer: Self-pay

## 2024-06-04 ENCOUNTER — Other Ambulatory Visit (HOSPITAL_BASED_OUTPATIENT_CLINIC_OR_DEPARTMENT_OTHER): Payer: Self-pay

## 2024-06-04 ENCOUNTER — Other Ambulatory Visit: Payer: Self-pay

## 2024-06-04 MED ORDER — FAMOTIDINE 20 MG PO TABS
40.0000 mg | ORAL_TABLET | Freq: Every morning | ORAL | 0 refills | Status: AC
  Filled 2024-06-04: qty 60, 30d supply, fill #0

## 2024-06-05 ENCOUNTER — Other Ambulatory Visit (HOSPITAL_COMMUNITY): Payer: Self-pay

## 2024-06-05 ENCOUNTER — Other Ambulatory Visit (HOSPITAL_BASED_OUTPATIENT_CLINIC_OR_DEPARTMENT_OTHER): Payer: Self-pay

## 2024-06-05 MED ORDER — WEGOVY 1.7 MG/0.75ML ~~LOC~~ SOAJ: 0.7500 mL | SUBCUTANEOUS | 5 refills | Status: AC

## 2024-06-05 MED ORDER — WEGOVY 0.5 MG/0.5ML ~~LOC~~ SOAJ
0.5000 mL | SUBCUTANEOUS | 0 refills | Status: AC
  Filled 2024-06-05: qty 2, 28d supply, fill #0

## 2024-06-05 MED ORDER — WEGOVY 1 MG/0.5ML ~~LOC~~ SOAJ: 0.5000 mL | SUBCUTANEOUS | 0 refills | Status: AC

## 2024-06-06 ENCOUNTER — Other Ambulatory Visit (HOSPITAL_BASED_OUTPATIENT_CLINIC_OR_DEPARTMENT_OTHER): Payer: Self-pay
